# Patient Record
Sex: Female | Born: 1970 | Race: White | Hispanic: No | Smoking: Current every day smoker
Health system: Southern US, Community
[De-identification: ages and names within clinical notes are randomized; demographics above are authoritative.]

## PROBLEM LIST (undated history)

## (undated) DIAGNOSIS — G2581 Restless legs syndrome: Secondary | ICD-10-CM

## (undated) DIAGNOSIS — I4891 Unspecified atrial fibrillation: Secondary | ICD-10-CM

## (undated) DIAGNOSIS — I10 Essential (primary) hypertension: Secondary | ICD-10-CM

## (undated) DIAGNOSIS — I480 Paroxysmal atrial fibrillation: Secondary | ICD-10-CM

## (undated) DIAGNOSIS — U071 COVID-19: Secondary | ICD-10-CM

## (undated) DIAGNOSIS — R319 Hematuria, unspecified: Secondary | ICD-10-CM

## (undated) DIAGNOSIS — E785 Hyperlipidemia, unspecified: Secondary | ICD-10-CM

## (undated) DIAGNOSIS — R102 Pelvic and perineal pain: Secondary | ICD-10-CM

## (undated) DIAGNOSIS — K219 Gastro-esophageal reflux disease without esophagitis: Secondary | ICD-10-CM

## (undated) DIAGNOSIS — J189 Pneumonia, unspecified organism: Secondary | ICD-10-CM

## (undated) DIAGNOSIS — M4802 Spinal stenosis, cervical region: Secondary | ICD-10-CM

## (undated) DIAGNOSIS — J019 Acute sinusitis, unspecified: Secondary | ICD-10-CM

## (undated) DIAGNOSIS — R002 Palpitations: Secondary | ICD-10-CM

## (undated) DIAGNOSIS — I471 Supraventricular tachycardia, unspecified: Secondary | ICD-10-CM

## (undated) DIAGNOSIS — Z800 Family history of malignant neoplasm of digestive organs: Secondary | ICD-10-CM

## (undated) DIAGNOSIS — I499 Cardiac arrhythmia, unspecified: Secondary | ICD-10-CM

## (undated) DIAGNOSIS — Z803 Family history of malignant neoplasm of breast: Secondary | ICD-10-CM

## (undated) DIAGNOSIS — Z87442 Personal history of urinary calculi: Secondary | ICD-10-CM

## (undated) DIAGNOSIS — E042 Nontoxic multinodular goiter: Secondary | ICD-10-CM

## (undated) DIAGNOSIS — Z8041 Family history of malignant neoplasm of ovary: Secondary | ICD-10-CM

## (undated) DIAGNOSIS — R809 Proteinuria, unspecified: Secondary | ICD-10-CM

## (undated) DIAGNOSIS — I878 Other specified disorders of veins: Secondary | ICD-10-CM

## (undated) DIAGNOSIS — J8410 Pulmonary fibrosis, unspecified: Secondary | ICD-10-CM

## (undated) DIAGNOSIS — Z8051 Family history of malignant neoplasm of kidney: Secondary | ICD-10-CM

## (undated) DIAGNOSIS — N939 Abnormal uterine and vaginal bleeding, unspecified: Secondary | ICD-10-CM

## (undated) DIAGNOSIS — Q245 Malformation of coronary vessels: Secondary | ICD-10-CM

## (undated) DIAGNOSIS — Z808 Family history of malignant neoplasm of other organs or systems: Secondary | ICD-10-CM

## (undated) DIAGNOSIS — F419 Anxiety disorder, unspecified: Secondary | ICD-10-CM

## (undated) DIAGNOSIS — T8859XA Other complications of anesthesia, initial encounter: Secondary | ICD-10-CM

## (undated) DIAGNOSIS — M5412 Radiculopathy, cervical region: Secondary | ICD-10-CM

## (undated) DIAGNOSIS — J45909 Unspecified asthma, uncomplicated: Secondary | ICD-10-CM

## (undated) HISTORY — DX: Essential (primary) hypertension: I10

## (undated) HISTORY — DX: Abnormal uterine and vaginal bleeding, unspecified: N93.9

## (undated) HISTORY — DX: Family history of malignant neoplasm of digestive organs: Z80.0

## (undated) HISTORY — DX: Malformation of coronary vessels: Q24.5

## (undated) HISTORY — DX: Family history of malignant neoplasm of ovary: Z80.41

## (undated) HISTORY — DX: Family history of malignant neoplasm of kidney: Z80.51

## (undated) HISTORY — DX: Family history of malignant neoplasm of breast: Z80.3

## (undated) HISTORY — PX: ESOPHAGOGASTRODUODENOSCOPY (EGD) WITH ESOPHAGEAL DILATION: SHX5812

## (undated) HISTORY — PX: ABDOMINAL HYSTERECTOMY: SHX81

## (undated) HISTORY — PX: COLONOSCOPY: SHX174

## (undated) HISTORY — PX: TUBAL LIGATION: SHX77

## (undated) HISTORY — DX: Pelvic and perineal pain: R10.2

## (undated) HISTORY — DX: Family history of malignant neoplasm of other organs or systems: Z80.8

## (undated) HISTORY — PX: CARDIAC CATHETERIZATION: SHX172

---

## 2012-01-27 ENCOUNTER — Emergency Department: Payer: Self-pay | Admitting: Internal Medicine

## 2012-01-27 LAB — URINALYSIS, COMPLETE
Nitrite: NEGATIVE
Ph: 5 (ref 4.5–8.0)
Protein: 100
Specific Gravity: 1.026 (ref 1.003–1.030)
Squamous Epithelial: 21

## 2012-01-27 LAB — LIPASE, BLOOD: Lipase: 208 U/L (ref 73–393)

## 2012-01-27 LAB — COMPREHENSIVE METABOLIC PANEL
Albumin: 4.2 g/dL (ref 3.4–5.0)
Anion Gap: 6 — ABNORMAL LOW (ref 7–16)
Bilirubin,Total: 0.4 mg/dL (ref 0.2–1.0)
Chloride: 106 mmol/L (ref 98–107)
Co2: 25 mmol/L (ref 21–32)
EGFR (African American): >60
EGFR (Non-African Amer.): >60
Glucose: 83 mg/dL (ref 65–99)
Potassium: 4.6 mmol/L (ref 3.5–5.1)
SGOT(AST): 27 U/L (ref 15–37)
SGPT (ALT): 20 U/L
Sodium: 137 mmol/L (ref 136–145)

## 2012-01-27 LAB — CBC
MCH: 30.8 pg (ref 26.0–34.0)
MCHC: 33.1 g/dL (ref 32.0–36.0)
MCV: 93 fL (ref 80–100)
RBC: 4.78 10*6/uL (ref 3.80–5.20)
WBC: 10.2 10*3/uL (ref 3.6–11.0)

## 2012-01-27 LAB — HCG, QUANTITATIVE, PREGNANCY: Beta Hcg, Quant.: <1 m[IU]/mL — ABNORMAL LOW

## 2012-01-27 LAB — PREGNANCY, URINE: Pregnancy Test, Urine: NEGATIVE m[IU]/mL

## 2012-05-02 ENCOUNTER — Emergency Department: Payer: Self-pay | Admitting: Emergency Medicine

## 2012-05-03 LAB — PREGNANCY, URINE: Pregnancy Test, Urine: NEGATIVE m[IU]/mL

## 2012-05-03 LAB — URINALYSIS, COMPLETE
Bacteria: NONE SEEN
Bilirubin,UR: NEGATIVE
Glucose,UR: NEGATIVE mg/dL (ref 0–75)
Ketone: NEGATIVE
Specific Gravity: 1.025 (ref 1.003–1.030)
Squamous Epithelial: 4
WBC UR: 61 /HPF (ref 0–5)

## 2014-01-03 ENCOUNTER — Ambulatory Visit: Payer: Self-pay | Admitting: Surgery

## 2014-01-18 ENCOUNTER — Observation Stay: Payer: Self-pay | Admitting: Internal Medicine

## 2014-01-18 DIAGNOSIS — Z9889 Other specified postprocedural states: Secondary | ICD-10-CM

## 2014-01-18 DIAGNOSIS — Q245 Malformation of coronary vessels: Secondary | ICD-10-CM

## 2014-01-18 HISTORY — DX: Malformation of coronary vessels: Q24.5

## 2014-01-18 HISTORY — PX: CARDIAC CATHETERIZATION: SHX172

## 2014-01-18 HISTORY — DX: Other specified postprocedural states: Z98.890

## 2014-01-18 HISTORY — PX: LEFT HEART CATH AND CORONARY ANGIOGRAPHY: CATH118249

## 2014-01-18 LAB — TROPONIN I
Troponin-I: <0.02 ng/mL
Troponin-I: <0.02 ng/mL

## 2014-01-18 LAB — COMPREHENSIVE METABOLIC PANEL
ALBUMIN: 3.5 g/dL (ref 3.4–5.0)
ALT: 19 U/L (ref 12–78)
ANION GAP: 10 (ref 7–16)
Alkaline Phosphatase: 48 U/L
BUN: 13 mg/dL (ref 7–18)
Bilirubin,Total: 0.2 mg/dL (ref 0.2–1.0)
Calcium, Total: 8.7 mg/dL (ref 8.5–10.1)
Chloride: 108 mmol/L — ABNORMAL HIGH (ref 98–107)
Co2: 21 mmol/L (ref 21–32)
Creatinine: 0.82 mg/dL (ref 0.60–1.30)
EGFR (African American): >60
EGFR (Non-African Amer.): >60
GLUCOSE: 140 mg/dL — AB (ref 65–99)
OSMOLALITY: 280 (ref 275–301)
Potassium: 4 mmol/L (ref 3.5–5.1)
SGOT(AST): 17 U/L (ref 15–37)
SODIUM: 139 mmol/L (ref 136–145)
TOTAL PROTEIN: 6.9 g/dL (ref 6.4–8.2)

## 2014-01-18 LAB — CBC
HCT: 40.3 % (ref 35.0–47.0)
HGB: 13.2 g/dL (ref 12.0–16.0)
MCH: 30 pg (ref 26.0–34.0)
MCHC: 32.7 g/dL (ref 32.0–36.0)
MCV: 92 fL (ref 80–100)
Platelet: 247 10*3/uL (ref 150–440)
RBC: 4.39 10*6/uL (ref 3.80–5.20)
RDW: 13.2 % (ref 11.5–14.5)
WBC: 9 10*3/uL (ref 3.6–11.0)

## 2014-01-18 LAB — CK TOTAL AND CKMB (NOT AT ARMC)
CK, TOTAL: 62 U/L
CK, Total: 53 U/L
CK, Total: 58 U/L
CK-MB: 0.6 ng/mL (ref 0.5–3.6)
CK-MB: 0.5 ng/mL (ref 0.5–3.6)
CK-MB: 0.7 ng/mL (ref 0.5–3.6)

## 2014-01-18 LAB — URINALYSIS, COMPLETE
BILIRUBIN, UR: NEGATIVE
Glucose,UR: NEGATIVE mg/dL (ref 0–75)
Ketone: NEGATIVE
LEUKOCYTE ESTERASE: NEGATIVE
Nitrite: NEGATIVE
PH: 7 (ref 4.5–8.0)
PROTEIN: NEGATIVE
RBC,UR: 88 /HPF (ref 0–5)
Specific Gravity: 1.003 (ref 1.003–1.030)
Squamous Epithelial: <1

## 2014-01-18 LAB — LIPASE, BLOOD: Lipase: 260 U/L (ref 73–393)

## 2014-01-19 LAB — CBC WITH DIFFERENTIAL/PLATELET
BASOS PCT: 0.6 %
Basophil #: 0.1 10*3/uL (ref 0.0–0.1)
Eosinophil #: 0.1 10*3/uL (ref 0.0–0.7)
Eosinophil %: 1.2 %
HCT: 32 % — ABNORMAL LOW (ref 35.0–47.0)
HGB: 10.7 g/dL — ABNORMAL LOW (ref 12.0–16.0)
Lymphocyte #: 3.1 10*3/uL (ref 1.0–3.6)
Lymphocyte %: 39.6 %
MCH: 31 pg (ref 26.0–34.0)
MCHC: 33.3 g/dL (ref 32.0–36.0)
MCV: 93 fL (ref 80–100)
MONO ABS: 0.6 x10 3/mm (ref 0.2–0.9)
Monocyte %: 7.2 %
Neutrophil #: 4 10*3/uL (ref 1.4–6.5)
Neutrophil %: 51.4 %
Platelet: 190 10*3/uL (ref 150–440)
RBC: 3.44 10*6/uL — AB (ref 3.80–5.20)
RDW: 13.6 % (ref 11.5–14.5)
WBC: 7.9 10*3/uL (ref 3.6–11.0)

## 2014-01-19 LAB — BASIC METABOLIC PANEL
ANION GAP: 9 (ref 7–16)
BUN: 10 mg/dL (ref 7–18)
CO2: 22 mmol/L (ref 21–32)
CREATININE: 0.77 mg/dL (ref 0.60–1.30)
Calcium, Total: 7.8 mg/dL — ABNORMAL LOW (ref 8.5–10.1)
Chloride: 111 mmol/L — ABNORMAL HIGH (ref 98–107)
EGFR (African American): >60
Glucose: 144 mg/dL — ABNORMAL HIGH (ref 65–99)
Osmolality: 285 (ref 275–301)
Potassium: 4 mmol/L (ref 3.5–5.1)
SODIUM: 142 mmol/L (ref 136–145)

## 2014-01-19 LAB — LIPID PANEL
CHOLESTEROL: 131 mg/dL (ref 0–200)
HDL Cholesterol: 33 mg/dL — ABNORMAL LOW (ref 40–60)
Ldl Cholesterol, Calc: 82 mg/dL (ref 0–100)
Triglycerides: 82 mg/dL (ref 0–200)
VLDL CHOLESTEROL, CALC: 16 mg/dL (ref 5–40)

## 2014-01-20 LAB — URINE CULTURE

## 2014-05-15 DIAGNOSIS — Q245 Malformation of coronary vessels: Secondary | ICD-10-CM

## 2014-05-15 DIAGNOSIS — N939 Abnormal uterine and vaginal bleeding, unspecified: Secondary | ICD-10-CM

## 2014-05-15 HISTORY — DX: Malformation of coronary vessels: Q24.5

## 2014-05-15 HISTORY — DX: Abnormal uterine and vaginal bleeding, unspecified: N93.9

## 2014-06-30 ENCOUNTER — Emergency Department: Payer: Self-pay | Admitting: Emergency Medicine

## 2014-06-30 LAB — BASIC METABOLIC PANEL
Anion Gap: 7 (ref 7–16)
BUN: 13 mg/dL (ref 7–18)
CALCIUM: 8.1 mg/dL — AB (ref 8.5–10.1)
CHLORIDE: 110 mmol/L — AB (ref 98–107)
CREATININE: 0.94 mg/dL (ref 0.60–1.30)
Co2: 23 mmol/L (ref 21–32)
EGFR (African American): >60
EGFR (Non-African Amer.): >60
Glucose: 161 mg/dL — ABNORMAL HIGH (ref 65–99)
Osmolality: 283 (ref 275–301)
POTASSIUM: 4.1 mmol/L (ref 3.5–5.1)
Sodium: 140 mmol/L (ref 136–145)

## 2014-06-30 LAB — CBC
HCT: 42.5 % (ref 35.0–47.0)
HGB: 14.3 g/dL (ref 12.0–16.0)
MCH: 31.2 pg (ref 26.0–34.0)
MCHC: 33.5 g/dL (ref 32.0–36.0)
MCV: 93 fL (ref 80–100)
PLATELETS: 249 10*3/uL (ref 150–440)
RBC: 4.57 10*6/uL (ref 3.80–5.20)
RDW: 13 % (ref 11.5–14.5)
WBC: 7 10*3/uL (ref 3.6–11.0)

## 2014-06-30 LAB — URINALYSIS, COMPLETE
BILIRUBIN, UR: NEGATIVE
Blood: NEGATIVE
GLUCOSE, UR: NEGATIVE mg/dL (ref 0–75)
Ketone: NEGATIVE
LEUKOCYTE ESTERASE: NEGATIVE
NITRITE: NEGATIVE
Ph: 5 (ref 4.5–8.0)
Protein: NEGATIVE
RBC,UR: 1 /HPF (ref 0–5)
Specific Gravity: 1.019 (ref 1.003–1.030)
Squamous Epithelial: 2

## 2014-06-30 LAB — WET PREP, GENITAL

## 2014-06-30 LAB — PREGNANCY, URINE: Pregnancy Test, Urine: NEGATIVE m[IU]/mL

## 2014-06-30 LAB — GC/CHLAMYDIA PROBE AMP

## 2014-07-02 ENCOUNTER — Emergency Department: Payer: Self-pay | Admitting: Student

## 2014-07-02 LAB — URINALYSIS, COMPLETE
BILIRUBIN, UR: NEGATIVE
Glucose,UR: NEGATIVE mg/dL (ref 0–75)
KETONE: NEGATIVE
LEUKOCYTE ESTERASE: NEGATIVE
NITRITE: POSITIVE
Ph: 5 (ref 4.5–8.0)
Protein: NEGATIVE
RBC,UR: 1 /HPF (ref 0–5)
SPECIFIC GRAVITY: 1.025 (ref 1.003–1.030)
WBC UR: 2 /HPF (ref 0–5)

## 2014-07-02 LAB — COMPREHENSIVE METABOLIC PANEL
ALBUMIN: 3.9 g/dL (ref 3.4–5.0)
Alkaline Phosphatase: 59 U/L
Anion Gap: 6 — ABNORMAL LOW (ref 7–16)
BUN: 16 mg/dL (ref 7–18)
Bilirubin,Total: 0.2 mg/dL (ref 0.2–1.0)
CALCIUM: 8.6 mg/dL (ref 8.5–10.1)
CHLORIDE: 106 mmol/L (ref 98–107)
Co2: 26 mmol/L (ref 21–32)
Creatinine: 0.89 mg/dL (ref 0.60–1.30)
Glucose: 83 mg/dL (ref 65–99)
Osmolality: 276 (ref 275–301)
Potassium: 4 mmol/L (ref 3.5–5.1)
SGOT(AST): 18 U/L (ref 15–37)
SGPT (ALT): 29 U/L
Sodium: 138 mmol/L (ref 136–145)
Total Protein: 7.8 g/dL (ref 6.4–8.2)

## 2014-07-02 LAB — PREGNANCY, URINE: PREGNANCY TEST, URINE: NEGATIVE m[IU]/mL

## 2014-07-02 LAB — CBC
HCT: 45.1 % (ref 35.0–47.0)
HGB: 15.1 g/dL (ref 12.0–16.0)
MCH: 31.2 pg (ref 26.0–34.0)
MCHC: 33.6 g/dL (ref 32.0–36.0)
MCV: 93 fL (ref 80–100)
Platelet: 273 10*3/uL (ref 150–440)
RBC: 4.85 10*6/uL (ref 3.80–5.20)
RDW: 13.2 % (ref 11.5–14.5)
WBC: 7.5 10*3/uL (ref 3.6–11.0)

## 2014-07-02 LAB — LIPASE, BLOOD: Lipase: 156 U/L (ref 73–393)

## 2014-07-04 LAB — URINE CULTURE

## 2014-11-30 NOTE — Discharge Summary (Signed)
PATIENT NAME:  Nicole Horton, Nicole Horton MR#:  [id number] DATE OF BIRTH:  03-06-1971  DATE OF ADMISSION:  01/18/2014 DATE OF DISCHARGE:  01/19/2014  PRESENTING COMPLAINT: Chest pain.   DISCHARGE DIAGNOSES: 1.  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  2.  Tobacco abuse.  3.  Urinary tract infection.   Cardiac cath showed normal coronaries with suspected distal LAD vasospasm.   CODE STATUS: FULL CODE.   MEDICATIONS: 1.  Ibuprofen 200 mg 1 to 2 tablets as needed for pain.  2.  Imdur 30 mg p.o. daily.  3.  Lovastatin 10 mg daily.  4.  Aspirin 81 mg daily.  5.  Ciprofloxacin 500 mg b.i.d.   Follow up with Dr. Neoma Laming in 1 to 2 weeks.   Lipid profile within normal limits. CBC within normal limits, except H and H of 10.7 and 32.0. Platelet count is 190. Basic metabolic panel within normal limits.    Cardiac cath showed global EF was normal, 60%. Normal coronaries with normal LVEF. The patient has distal LAD myocardial bridge, which even though has normal coronaries, causes partial spasm in the artery, should be treated with aspirin, nitrites and close followup.   BRIEF SUMMARY OF HOSPITAL COURSE:  [name] is a 44 year old Caucasian female with no significant past medical history other than tobacco abuse, comes in with:  1.  Chest pain, but acute EKG changes while getting stress test done. The patient was seen by Dr. Humphrey Rolls and recommended cath; results as above were noted. Cardiac enzymes remained negative. She was continued on aspirin, Imdur and statins. Unable to give beta blockers due to low blood pressure. Her cath results as above were noted. She will follow up with Dr. Humphrey Rolls closely.  2.  Tobacco abuse. Counseled smoking cessation.  3.  Urinary tract infection. The patient advised to finish her Cipro.   TIME SPENT: 40 minutes.    ____________________________ Hart Rochester Posey Pronto, MD sap:dmm D: 01/21/2014  13:51:59 ET T: 01/21/2014 19:13:14 ET JOB#: [id number]  cc: Torrey Horseman A. Posey Pronto, MD, <Dictator> Dionisio David, MD Ilda Basset MD ELECTRONICALLY SIGNED 02/09/2014 10:21

## 2014-11-30 NOTE — Consult Note (Signed)
PATIENT NAME:  Nicole Horton, STALLINGS MR#:  [id number] DATE OF BIRTH:  1971-04-03  DATE OF CONSULTATION:  01/18/2014  REFERRING PHYSICIAN:   CONSULTING PHYSICIAN:  Dionisio David, MD  INDICATION FOR CONSULTATION: Chest pain.   HISTORY OF PRESENT ILLNESS: This is a 44 year old pleasant white female with a past medical history of hyperlipidemia, 1 pack per day smoking, presented to the Emergency Room with chest pain. 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 She woke up all of a sudden with chest pain. She has a prior history of having acid reflux. She takes Nexium, but she felt this pain was different in character from her normal acid reflux chest pain. She had some EKG changes and was scheduled for stress test after MI was ruled out. While doing stress test, initial EKG had no acute changes, normal sinus rhythm. During Lexiscan infusion, the patient developed 3 mm downsloping ST depression in the inferior leads and anterolateral leads associated with chest pain, diaphoresis and feeling of fainting. Thus it was decided to see the patient and continue to finish the scan because the chest pain resolved after watching her for like 10 minutes. We thus feel that she needs to have cardiac catheterization.   PAST MEDICAL HISTORY: History of hyperlipidemia. No history of hypertension. Actually has history of hypotension. No history of diabetes.   FAMILY HISTORY: Positive for cardiomyopathy. History of coronary artery disease. Mother had bypass, angioplasty.   PHYSICAL EXAMINATION: GENERAL: She is alert and oriented x3, in no acute distress right now.  VITAL SIGNS: Blood pressure 85/60, respirations 18, pulse 70. She is afebrile.  NECK: No JVD.  LUNGS: Clear.  HEART: Regular rate and rhythm. Normal S1, S2. No audible murmur.  ABDOMEN: Soft and nontender. Positive bowel sounds.  EXTREMITIES: No pedal edema.   DIAGNOSTIC DATA: Cardiac enzymes were negative.   Initial EKG showed normal sinus rhythm, 99 beats per minute. ST  depression of 1 mm to 2 mm in leads II, III, aVF and V3 to V6.   EKG prior to the stress test was unremarkable. Follow-up after the Lexiscan showed downsloping ST depressions with T wave inversions inferiorly and anterolaterally.   ASSESSMENT AND PLAN: Severe ischemia diffusely on EKG portion of the Lexiscan associated with chest pain, syncope. Continue to do scan on the patient, but because it is a Friday and severe symptoms and severe ischemia I advised cardiac catheterization, which will be scheduled for today. Discussed with Dr. Fritzi Mandes and the patient, and the patient has agreed to the procedure.   Thank you very much for the referral.  ____________________________ Dionisio David, MD sak:sb D: 01/18/2014 11:23:06 ET T: 01/18/2014 11:49:01 ET JOB#: [id number]  cc: Dionisio David, MD, <Dictator> Dionisio David MD ELECTRONICALLY SIGNED 02/20/2014 9:20

## 2014-11-30 NOTE — H&P (Signed)
PATIENT NAME:  Nicole Horton, Nicole Horton MR#:  [id number] DATE OF BIRTH:  1971-08-06  DATE OF ADMISSION:  01/18/2014  PRIMARY CARE PHYSICIAN: Enid Derry, MD  REFERRING PHYSICIAN: Dr. Jasmine December  CHIEF COMPLAINT: Chest pain.  HISTORY OF PRESENT ILLNESS: The patient is a 44 year old female  is presenting to the ER with the chief complaint of chest pain. The is reporting that she woke up with chest pain this morning following which she had nausea, vomiting, and diarrhea. The patient denies any shortness of breath, but she felt dizzy, had some tremors and chills. She denies any blood in her vomit or diarrhea. Some epigastric discomfort is noticed. She was diagnosed with UTI yesterday by her primary care physician and was given some antibiotics which were not filled by the patient yet. Initial troponin is negative, but EKG is abnormal with some nonspecific ST wave depressions. Hospitalist team is called to admit the patient. During my examination, the patient is denying any chest pain as she was given Dilaudid. After giving Dilaudid, the patient became hypotensive. Denies any history of heart attack in the past.   PAST MEDICAL HISTORY: Ectopic pregnancies.  PAST SURGICAL HISTORY: Tubal ligation.  ALLERGIES: CODEINE.   HOME MEDICATIONS: Ibuprofen 200 mg 3 times a day as needed.   FAMILY HISTORY: Mom has dilated cardiomyopathy.  REVIEW OF SYSTEMS: CONSTITUTIONAL: Denies any fever, fatigue. EYES: Denies blurry vision, double vision.  ENT: Denies epistaxis, discharge. RESPIRATORY: Denies cough, wheezing, COPD. CARDIOVASCULAR: Complaining of chest pain. Denies any palpitations, but has dizziness. No syncope.  GASTROINTESTINAL: Has nausea, vomiting, diarrhea and epigastric abdominal pain. No hematemesis or melena.  GENITOURINARY: No dysuria or hematuria.  GYNECOLOGIC AND BREASTS: Denies breast masses or vaginal discharge. ENDOCRINE: Denies polyuria, nocturia, thyroid problems, heat or cold  intolerance. HEMATOLOGIC AND LYMPHATIC: No anemia, easy bruising, bleeding.  INTEGUMENTARY: No acne, rash, lesions.  MUSCULOSKELETAL: No joint pain in the neck and back. Denies gout.  NEUROLOGIC: Denies vertigo, ataxia.  PSYCHIATRIC: No ADD, OCD.   PHYSICAL EXAMINATION: VITAL SIGNS: Temperature 98.2, pulse 66, respirations 18, blood pressure 93/45, pulse ox 98%.  GENERAL APPEARANCE: Not in acute distress. Moderately built and nourished. HEENT: Normocephalic, atraumatic. Pupils are equally reacting to light and accommodation. No scleral icterus. No conjunctival injection. No sinus tenderness. No postnasal drip.  NECK: Supple. No JVD. No thyromegaly. Range of motion is intact.  LUNGS: Clear to auscultation bilaterally. No accessory muscle usage. No anterior chest wall tenderness on palpation.  CARDIAC: S1 and S2. Regular rate and rhythm. No murmurs.  ABDOMEN: Soft. Bowel sounds are positive in all 4 quadrants. Nontender, nondistended. No hepatosplenomegaly. No masses felt.  NEUROLOGIC: Awake, alert and oriented x3. Cranial nerves II through XII are intact. Motor and sensory are intact. Reflexes are 2+.  EXTREMITIES: No edema. No cyanosis. No clubbing.  SKIN: Warm to touch. Normal turgor. No rashes. No lesions. No CVA tenderness.  PSYCHIATRIC: Normal mood and affect.   DIAGNOSTIC DATA: LFTs are normal. Two sets of cardiac enzymes are normal. CBC is normal. Urinalysis: Straw-colored, clear in appearance, blood 3+, nitrite negative, leukocyte esterase negative. Urine culture is ordered, which is pending. BMP: Glucose 140, chloride 108. The rest of the BMP is normal.   Chest x-ray, PA and lateral views: No active cardiopulmonary disease.   A 12-lead EKG has revealed normal sinus rhythm at 99 beats per minute, anterior/inferior lead nonspecific ST wave depressions and nonspecific inverted T waves.   ASSESSMENT AND PLAN: A 44 year old Caucasian female presenting to the ER with  the chief complaint  of chest pain followed by nausea, vomiting, diarrhea, epigastric discomfort and just diagnosed with urinary tract infection by primary care physician yesterday, will be admitted with the following assessment and plan: 1.  Chest pain with abnormal EKG. Rule out acute myocardial infarction. We will admit her to telemetry, cycle cardiac biomarkers. Cardiology consult is placed. Will obtain 2-D echocardiogram. Myoview test is ordered. The patient will be on oxygen, nitroglycerin, aspirin, and statin. We will hold off on beta blocker in view of hypotension and also as she is scheduled for Myoview test this morning.  2.  Acute gastroenteritis, probably viral in etiology. Will check stool test. Stool culture and sensitivity will be ordered.  3.  Urinary tract infection. Urinalysis is normal. Urine culture is pending. The patient will be provided with IV Rocephin empirically.  4.  We will provide her gastrointestinal and deep vein thrombosis prophylaxis.   She is FULL code. Daughter is the medical power of attorney. Diagnosis and plan of care was discussed in detail with the patient. She is aware of the plan.   TOTAL TIME SPENT ON ADMISSION: 50 minutes.  ____________________________ [name], MD ag:sb D: 01/18/2014 08:02:57 ET T: 01/18/2014 08:40:59 ET JOB#: [id number]  cc: [name], MD, <Dictator> Dionisio David, MD [name] MD ELECTRONICALLY SIGNED 01/27/2014 0:52

## 2015-03-26 ENCOUNTER — Encounter: Payer: Self-pay | Admitting: Obstetrics and Gynecology

## 2015-05-06 ENCOUNTER — Emergency Department
Admission: EM | Admit: 2015-05-06 | Discharge: 2015-05-06 | Disposition: A | Discharge status: Home / Self Care | Payer: BLUE CROSS/BLUE SHIELD | Attending: Emergency Medicine | Admitting: Emergency Medicine

## 2015-05-06 ENCOUNTER — Encounter: Payer: Self-pay | Admitting: Emergency Medicine

## 2015-05-06 ENCOUNTER — Emergency Department: Payer: BLUE CROSS/BLUE SHIELD

## 2015-05-06 DIAGNOSIS — Y9301 Activity, walking, marching and hiking: Secondary | ICD-10-CM | POA: Diagnosis not present

## 2015-05-06 DIAGNOSIS — S92252A Displaced fracture of navicular [scaphoid] of left foot, initial encounter for closed fracture: Secondary | ICD-10-CM | POA: Insufficient documentation

## 2015-05-06 DIAGNOSIS — I10 Essential (primary) hypertension: Secondary | ICD-10-CM | POA: Insufficient documentation

## 2015-05-06 DIAGNOSIS — Y998 Other external cause status: Secondary | ICD-10-CM | POA: Diagnosis not present

## 2015-05-06 DIAGNOSIS — S99922A Unspecified injury of left foot, initial encounter: Secondary | ICD-10-CM | POA: Diagnosis present

## 2015-05-06 DIAGNOSIS — Y9289 Other specified places as the place of occurrence of the external cause: Secondary | ICD-10-CM | POA: Diagnosis not present

## 2015-05-06 DIAGNOSIS — Z720 Tobacco use: Secondary | ICD-10-CM | POA: Diagnosis not present

## 2015-05-06 DIAGNOSIS — E785 Hyperlipidemia, unspecified: Secondary | ICD-10-CM | POA: Insufficient documentation

## 2015-05-06 DIAGNOSIS — W1839XA Other fall on same level, initial encounter: Secondary | ICD-10-CM | POA: Diagnosis not present

## 2015-05-06 HISTORY — DX: Gastro-esophageal reflux disease without esophagitis: K21.9

## 2015-05-06 HISTORY — DX: Hyperlipidemia, unspecified: E78.5

## 2015-05-06 MED ORDER — ONDANSETRON 8 MG PO TBDP: 8.0000 mg | ORAL_TABLET | Freq: Three times a day (TID) | ORAL | Status: DC | PRN

## 2015-05-06 MED ORDER — TRAMADOL HCL 50 MG PO TABS: 50.0000 mg | ORAL_TABLET | Freq: Four times a day (QID) | ORAL | Status: DC | PRN

## 2015-05-06 MED ORDER — TRAMADOL HCL 50 MG PO TABS
50.0000 mg | ORAL_TABLET | Freq: Once | ORAL | Status: AC
  Administered 2015-05-06: 50 mg via ORAL
  Filled 2015-05-06: qty 1

## 2015-05-06 NOTE — ED Provider Notes (Signed)
[facility] Emergency Department Provider Note  ____________________________________________  Time seen: Approximately 10:06 AM  I have reviewed the triage vital signs and the nursing notes.   HISTORY  Chief Complaint Foot Pain    HPI Nicole Horton is a 44 y.o. female patient complaining of left foot pain secondary to a fall. Patient states she is walking to her driveway which is full of rocks and twisted and fell. Patient states since yesterday she's having trouble ambulating secondary to pain to the left foot. Patient currently rates her pain as a 9/10 describe the sharp. No palliative measures since onset.   Past Medical History  Diagnosis Date  . Hyperlipidemia   . Hypertension   . GERD (gastroesophageal reflux disease)     There are no active problems to display for this patient.   Past Surgical History  Procedure Laterality Date  . Tubal ligation      Current Outpatient Rx  Name  Route  Sig  Dispense  Refill  . ondansetron (ZOFRAN ODT) 8 MG disintegrating tablet   Oral   Take 1 tablet (8 mg total) by mouth every 8 (eight) hours as needed for nausea or vomiting.   20 tablet   0   . traMADol (ULTRAM) 50 MG tablet   Oral   Take 1 tablet (50 mg total) by mouth every 6 (six) hours as needed.   20 tablet   0     Allergies Codeine  History reviewed. No pertinent family history.  Social History Social History  Substance Use Topics  . Smoking status: Current Every Day Smoker  . Smokeless tobacco: None  . Alcohol Use: No    Review of Systems Constitutional: No fever/chills Eyes: No visual changes. ENT: No sore throat. Cardiovascular: Denies chest pain. Respiratory: Denies shortness of breath. Gastrointestinal: No abdominal pain.  No nausea, no vomiting.  No diarrhea.  No constipation. Genitourinary: Negative for dysuria. Musculoskeletal: Negative for back pain. Skin: Negative for rash. Neurological: Negative for headaches,  focal weakness or numbness. Endocrine:Hyperlipidemia and hypertension. Allergic/Immunilogical: Codeine  10-point ROS otherwise negative.  ____________________________________________   PHYSICAL EXAM:  VITAL SIGNS: ED Triage Vitals  Enc Vitals Group     BP 05/06/15 0931 125/79 mmHg     Pulse Rate 05/06/15 0931 89     Resp 05/06/15 0931 20     Temp 05/06/15 0931 98.4 F (36.9 C)     Temp Source 05/06/15 0931 Oral     SpO2 05/06/15 0931 98 %     Weight 05/06/15 0931 160 lb (72.576 kg)     Height 05/06/15 0931 5\' 1"  (1.549 m)     Head Cir --      Peak Flow --      Pain Score 05/06/15 0926 9     Pain Loc --      Pain Edu? --      Excl. in [place]? --     Constitutional: Alert and oriented. Well appearing and in no acute distress. Eyes: Conjunctivae are normal. PERRL. EOMI. Head: Atraumatic. Nose: No congestion/rhinnorhea. Mouth/Throat: Mucous membranes are moist.  Oropharynx non-erythematous. Neck: No stridor.   Hematological/Lymphatic/Immunilogical: No cervical lymphadenopathy. Cardiovascular: Normal rate, regular rhythm. Grossly normal heart sounds.  Good peripheral circulation. Respiratory: Normal respiratory effort.  No retractions. Lungs CTAB. Gastrointestinal: Soft and nontender. No distention. No abdominal bruits. No CVA tenderness. Musculoskeletal: Nauseous deformity of the left foot. Moderate edema. Guarding palpation dorsal aspect of the left foot. Patient patient bears weight complain  of pain with ambulation.  Neurologic:  Normal speech and language. No gross focal neurologic deficits are appreciated. No gait instability. Skin:  Skin is warm, dry and intact. No rash noted. Psychiatric: Mood and affect are normal. Speech and behavior are normal.  ____________________________________________   LABS (all labs ordered are listed, but only abnormal results are displayed)  Labs Reviewed - No data to  display ____________________________________________  EKG   ____________________________________________  RADIOLOGY  X-rays suggestive of probable mid foot fracture. I, Sable Feil, personally viewed and evaluated these images (plain radiographs) as part of my medical decision making.   ____________________________________________   PROCEDURES  Procedure(s) performed: None  Critical Care performed: No  ____________________________________________   INITIAL IMPRESSION / ASSESSMENT AND PLAN / ED COURSE  Pertinent labs & imaging results that were available during my care of the patient were reviewed by me and considered in my medical decision making (see chart for details).  Left midfoot fracture. Patient placed in a splint and given crutch ambulation. She is given prescription for tramadol and advised follow-up with orthopedics. Patient given a work note until evaluation by orthopedics. ____________________________________________   FINAL CLINICAL IMPRESSION(S) / ED DIAGNOSES  Final diagnoses:  Left navicular fracture of foot, closed, initial encounter      Sable Feil, PA-C 05/06/15 [place], MD 05/06/15 [id number]

## 2015-05-06 NOTE — Discharge Instructions (Signed)
Wear splint and ambulate  with crutches pending Ortho evaluation.

## 2015-05-06 NOTE — ED Notes (Signed)
Pt to ed with c/o left foot pain. Pt states she was walking this am to go to her car and fell down rock walkway at home.  Pt reports she was unable to walk on it.

## 2015-07-17 ENCOUNTER — Ambulatory Visit
Admission: EM | Admit: 2015-07-17 | Discharge: 2015-07-17 | Disposition: A | Discharge status: Home / Self Care | Payer: BLUE CROSS/BLUE SHIELD | Attending: Family Medicine | Admitting: Family Medicine

## 2015-07-17 DIAGNOSIS — N3000 Acute cystitis without hematuria: Secondary | ICD-10-CM | POA: Diagnosis not present

## 2015-07-17 LAB — URINALYSIS COMPLETE WITH MICROSCOPIC (ARMC ONLY)
BILIRUBIN URINE: NEGATIVE
GLUCOSE, UA: NEGATIVE mg/dL
Hgb urine dipstick: NEGATIVE
Ketones, ur: NEGATIVE mg/dL
Nitrite: POSITIVE — AB
Protein, ur: NEGATIVE mg/dL
RBC / HPF: NONE SEEN RBC/hpf (ref 0–5)
Specific Gravity, Urine: 1.015 (ref 1.005–1.030)
pH: 6.5 (ref 5.0–8.0)

## 2015-07-17 MED ORDER — FLUCONAZOLE 200 MG PO TABS: 200.0000 mg | ORAL_TABLET | Freq: Once | ORAL | Status: AC

## 2015-07-17 MED ORDER — SULFAMETHOXAZOLE-TRIMETHOPRIM 800-160 MG PO TABS: 1.0000 | ORAL_TABLET | Freq: Two times a day (BID) | ORAL | Status: AC

## 2015-07-17 NOTE — Discharge Instructions (Signed)

## 2015-07-17 NOTE — ED Notes (Signed)
Hx UTI's. Started last Saturday with low right back pain. + frequency.

## 2015-07-17 NOTE — ED Provider Notes (Signed)
CSN: [id number]     Arrival date & time 07/17/15  1138 History   First MD Initiated Contact with Patient 07/17/15 1257     Chief Complaint  Patient presents with  . Urinary Tract Infection   (Consider location/radiation/quality/duration/timing/severity/associated sxs/prior Treatment) The history is provided by the patient.     Nicole Horton is a 44 y.o. female presenting for R lower back pain for 4-5 days.  She reports nocturia x2-3 times nightly, urinary urgency and frequency x1.5 wks.    She had an UTI about a year ago with similar symptoms and was treated with Bactrim.  She reports that she always gets a yeast infection that needs to be treated with Diflucan after taking antibiotics.  Patient reports she was told she is going through menopause recently by OB/gyn.  Past Medical History  Diagnosis Date  . Hyperlipidemia   . Hypertension   . GERD (gastroesophageal reflux disease)    Past Surgical History  Procedure Laterality Date  . Tubal ligation     Family History  Problem Relation Age of Onset  . Diabetes Mother   . Heart failure Mother    Social History  Substance Use Topics  . Smoking status: Current Every Day Smoker -- 0.50 packs/day    Types: Cigarettes  . Smokeless tobacco: None  . Alcohol Use: No   OB History    Gravida Para Term Preterm AB TAB SAB Ectopic Multiple Living   4         4     Review of Systems  Constitutional: Positive for chills. Negative for activity change, appetite change and fatigue.  HENT: Negative.   Respiratory: Negative.   Cardiovascular: Negative.   Gastrointestinal: Positive for abdominal pain. Negative for nausea, vomiting, diarrhea and constipation.  Genitourinary: Positive for urgency, frequency and flank pain. Negative for dysuria, hematuria and difficulty urinating.  Musculoskeletal: Negative.   Skin: Negative.   Neurological: Negative.     Allergies  Codeine  Home Medications   Prior to Admission medications    Medication Sig Start Date End Date Taking? Authorizing Provider  amLODipine (NORVASC) 5 MG tablet Take 5 mg by mouth daily.   Yes Historical Provider, MD  esomeprazole (NEXIUM) 20 MG capsule Take 20 mg by mouth daily at 12 noon.   Yes Historical Provider, MD  fluconazole (DIFLUCAN) 200 MG tablet Take 1 tablet (200 mg total) by mouth once. As needed for yeast infection 07/17/15 07/24/15  Virginia Crews, MD  ondansetron (ZOFRAN ODT) 8 MG disintegrating tablet Take 1 tablet (8 mg total) by mouth every 8 (eight) hours as needed for nausea or vomiting. 05/06/15   Sable Feil, PA-C  sulfamethoxazole-trimethoprim (BACTRIM DS,SEPTRA DS) 800-160 MG tablet Take 1 tablet by mouth 2 (two) times daily. 07/17/15 07/24/15  Virginia Crews, MD  traMADol (ULTRAM) 50 MG tablet Take 1 tablet (50 mg total) by mouth every 6 (six) hours as needed. 05/06/15 05/05/16  Sable Feil, PA-C   Meds Ordered and Administered this Visit  Medications - No data to display  BP 108/89 mmHg  Pulse 84  Temp(Src) 97.4 F (36.3 C) (Tympanic)  Resp 16  Ht 5' 1.5" (1.562 m)  Wt 165 lb (74.844 kg)  BMI 30.68 kg/m2  SpO2 100%  LMP 04/18/2015 (Exact Date) No data found.   Physical Exam  Constitutional: She is oriented to person, place, and time. She appears well-developed and well-nourished. No distress.  HENT:  Head: Normocephalic and atraumatic.  Eyes: Conjunctivae  are normal. No scleral icterus.  Neck: Neck supple.  Cardiovascular: Normal rate, regular rhythm and normal heart sounds.   No murmur heard. Pulmonary/Chest: Effort normal and breath sounds normal. No respiratory distress. She has no wheezes.  Abdominal: Soft. Bowel sounds are normal. She exhibits no distension.  Suprapubic tenderness R CVA tenderness  Musculoskeletal: She exhibits no edema or tenderness.  Neurological: She is alert and oriented to person, place, and time.  Skin: Skin is warm and dry. No rash noted.  Psychiatric: She has a normal  mood and affect. Her behavior is normal.    ED Course  Procedures (including critical care time)  Labs Review Labs Reviewed  URINALYSIS COMPLETEWITH MICROSCOPIC ([place]) - Abnormal; Notable for the following:    Nitrite POSITIVE (*)    Leukocytes, UA TRACE (*)    Bacteria, UA MANY (*)    Squamous Epithelial / LPF 6-30 (*)    All other components within normal limits  URINE CULTURE    Imaging Review No results found.   Visual Acuity Review  Right Eye Distance:   Left Eye Distance:   Bilateral Distance:    Right Eye Near:   Left Eye Near:    Bilateral Near:       MDM   1. Acute cystitis without hematuria     Nicole Horton is a 44 y.o. female with symptoms, exam, and UA consistent with UTI.  With R CVA tenderness, suspicion for ascension of infection to upper urinary tract.  Previous UTI was ESBL E Coli sensitive to Bactrim.  Will treat as complicated UTI with 7 day course of Bactrim.  Send urine for culture.  Return precautions given.  Also gave Rx for one dose of diflucan to use after course of antibiotics in case of possible antibiotic-driven vaginal yeast infection.   Virginia Crews, MD, MPH PGY-2,  [facility] 07/17/2015 1:34 PM   Virginia Crews, MD 07/17/15 1336

## 2015-07-19 LAB — URINE CULTURE: Special Requests: NORMAL

## 2015-07-31 DIAGNOSIS — I10 Essential (primary) hypertension: Secondary | ICD-10-CM | POA: Insufficient documentation

## 2015-07-31 HISTORY — DX: Essential (primary) hypertension: I10

## 2015-08-06 ENCOUNTER — Other Ambulatory Visit: Payer: Self-pay | Admitting: Family Medicine

## 2015-08-06 DIAGNOSIS — R109 Unspecified abdominal pain: Secondary | ICD-10-CM

## 2015-08-07 ENCOUNTER — Emergency Department
Admission: EM | Admit: 2015-08-07 | Discharge: 2015-08-07 | Disposition: A | Discharge status: Home / Self Care | Payer: BLUE CROSS/BLUE SHIELD | Attending: Emergency Medicine | Admitting: Emergency Medicine

## 2015-08-07 ENCOUNTER — Encounter: Payer: Self-pay | Admitting: Emergency Medicine

## 2015-08-07 ENCOUNTER — Emergency Department: Payer: BLUE CROSS/BLUE SHIELD

## 2015-08-07 DIAGNOSIS — F17210 Nicotine dependence, cigarettes, uncomplicated: Secondary | ICD-10-CM | POA: Diagnosis not present

## 2015-08-07 DIAGNOSIS — Z792 Long term (current) use of antibiotics: Secondary | ICD-10-CM | POA: Diagnosis not present

## 2015-08-07 DIAGNOSIS — Z79899 Other long term (current) drug therapy: Secondary | ICD-10-CM | POA: Diagnosis not present

## 2015-08-07 DIAGNOSIS — Z791 Long term (current) use of non-steroidal anti-inflammatories (NSAID): Secondary | ICD-10-CM | POA: Insufficient documentation

## 2015-08-07 DIAGNOSIS — R109 Unspecified abdominal pain: Secondary | ICD-10-CM | POA: Diagnosis present

## 2015-08-07 DIAGNOSIS — N200 Calculus of kidney: Secondary | ICD-10-CM

## 2015-08-07 LAB — CBC WITH DIFFERENTIAL/PLATELET
BASOS ABS: 0.1 10*3/uL (ref 0–0.1)
BASOS PCT: 1 %
EOS ABS: 0.1 10*3/uL (ref 0–0.7)
EOS PCT: 2 %
HCT: 42.5 % (ref 35.0–47.0)
Hemoglobin: 14.1 g/dL (ref 12.0–16.0)
LYMPHS PCT: 33 %
Lymphs Abs: 2.3 10*3/uL (ref 1.0–3.6)
MCH: 29.4 pg (ref 26.0–34.0)
MCHC: 33.1 g/dL (ref 32.0–36.0)
MCV: 89 fL (ref 80.0–100.0)
Monocytes Absolute: 0.5 10*3/uL (ref 0.2–0.9)
Monocytes Relative: 7 %
Neutro Abs: 3.9 10*3/uL (ref 1.4–6.5)
Neutrophils Relative %: 57 %
PLATELETS: 298 10*3/uL (ref 150–440)
RBC: 4.77 MIL/uL (ref 3.80–5.20)
RDW: 13.9 % (ref 11.5–14.5)
WBC: 6.9 10*3/uL (ref 3.6–11.0)

## 2015-08-07 LAB — URINALYSIS COMPLETE WITH MICROSCOPIC (ARMC ONLY)
BACTERIA UA: NONE SEEN
BILIRUBIN URINE: NEGATIVE
GLUCOSE, UA: NEGATIVE mg/dL
Hgb urine dipstick: NEGATIVE
Ketones, ur: NEGATIVE mg/dL
LEUKOCYTES UA: NEGATIVE
Nitrite: NEGATIVE
PH: 5 (ref 5.0–8.0)
PROTEIN: NEGATIVE mg/dL
RBC / HPF: NONE SEEN RBC/hpf (ref 0–5)
SPECIFIC GRAVITY, URINE: 1.014 (ref 1.005–1.030)
WBC, UA: NONE SEEN WBC/hpf (ref 0–5)

## 2015-08-07 LAB — COMPREHENSIVE METABOLIC PANEL
ALT: 23 U/L (ref 14–54)
AST: 19 U/L (ref 15–41)
Albumin: 4 g/dL (ref 3.5–5.0)
Alkaline Phosphatase: 48 U/L (ref 38–126)
Anion gap: 7 (ref 5–15)
BILIRUBIN TOTAL: 0.5 mg/dL (ref 0.3–1.2)
BUN: 9 mg/dL (ref 6–20)
CALCIUM: 8.9 mg/dL (ref 8.9–10.3)
CHLORIDE: 109 mmol/L (ref 101–111)
CO2: 20 mmol/L — ABNORMAL LOW (ref 22–32)
CREATININE: 0.66 mg/dL (ref 0.44–1.00)
Glucose, Bld: 138 mg/dL — ABNORMAL HIGH (ref 65–99)
Potassium: 4 mmol/L (ref 3.5–5.1)
Sodium: 136 mmol/L (ref 135–145)
TOTAL PROTEIN: 7.1 g/dL (ref 6.5–8.1)

## 2015-08-07 LAB — LIPASE, BLOOD: LIPASE: 32 U/L (ref 11–51)

## 2015-08-07 MED ORDER — NAPROXEN 500 MG PO TABS: 500.0000 mg | ORAL_TABLET | Freq: Two times a day (BID) | ORAL | Status: DC

## 2015-08-07 MED ORDER — IOHEXOL 240 MG/ML SOLN
25.0000 mL | Freq: Once | INTRAMUSCULAR | Status: AC | PRN
  Administered 2015-08-07: 25 mL via ORAL

## 2015-08-07 MED ORDER — PHENAZOPYRIDINE HCL 200 MG PO TABS: 200.0000 mg | ORAL_TABLET | Freq: Three times a day (TID) | ORAL | Status: DC | PRN

## 2015-08-07 MED ORDER — METHYLPREDNISOLONE SODIUM SUCC 125 MG IJ SOLR
125.0000 mg | Freq: Once | INTRAMUSCULAR | Status: AC
  Administered 2015-08-07: 125 mg via INTRAVENOUS
  Filled 2015-08-07: qty 2

## 2015-08-07 MED ORDER — IOHEXOL 350 MG/ML SOLN
100.0000 mL | Freq: Once | INTRAVENOUS | Status: AC | PRN
  Administered 2015-08-07: 100 mL via INTRAVENOUS

## 2015-08-07 MED ORDER — ONDANSETRON HCL 4 MG/2ML IJ SOLN
4.0000 mg | Freq: Once | INTRAMUSCULAR | Status: AC
  Administered 2015-08-07: 4 mg via INTRAVENOUS
  Filled 2015-08-07: qty 2

## 2015-08-07 MED ORDER — DIPHENHYDRAMINE HCL 50 MG/ML IJ SOLN
25.0000 mg | Freq: Once | INTRAMUSCULAR | Status: AC
  Administered 2015-08-07: 25 mg via INTRAVENOUS
  Filled 2015-08-07: qty 1

## 2015-08-07 MED ORDER — MORPHINE SULFATE (PF) 4 MG/ML IV SOLN
4.0000 mg | Freq: Once | INTRAVENOUS | Status: AC
  Administered 2015-08-07: 4 mg via INTRAVENOUS
  Filled 2015-08-07: qty 1

## 2015-08-07 MED ORDER — TAMSULOSIN HCL 0.4 MG PO CAPS: 0.4000 mg | ORAL_CAPSULE | Freq: Every day | ORAL | Status: DC

## 2015-08-07 MED ORDER — ONDANSETRON 8 MG PO TBDP: 8.0000 mg | ORAL_TABLET | Freq: Three times a day (TID) | ORAL | Status: DC | PRN

## 2015-08-07 MED ORDER — SODIUM CHLORIDE 0.9 % IV BOLUS (SEPSIS)
1000.0000 mL | Freq: Once | INTRAVENOUS | Status: AC
  Administered 2015-08-07: 1000 mL via INTRAVENOUS

## 2015-08-07 NOTE — ED Notes (Signed)
Been treated for UTI x1 month , x2 week right flank pain, yesterday seen at PCP (- for uti ) , this AM right side pain increased worse than ever

## 2015-08-07 NOTE — ED Notes (Signed)
Pt to ed with c/o right flank pain that started about 2 weeks ago,  Pt reports increased pain in right flank today and severe pain while urinating today.  Pt states pain increases with movement or voiding.  Pt alert and oriented and labs drawn and sent and IV started,  Pt advised need for urine.

## 2015-08-07 NOTE — Discharge Instructions (Signed)
Kidney Stones °Kidney stones (urolithiasis) are deposits that form inside your kidneys. The intense pain is caused by the stone moving through the urinary tract. When the stone moves, the ureter goes into spasm around the stone. The stone is usually passed in the urine.  °CAUSES  °· A disorder that makes certain neck glands produce too much parathyroid hormone (primary hyperparathyroidism). °· A buildup of uric acid crystals, similar to gout in your joints. °· Narrowing (stricture) of the ureter. °· A kidney obstruction present at birth (congenital obstruction). °· Previous surgery on the kidney or ureters. °· Numerous kidney infections. °SYMPTOMS  °· Feeling sick to your stomach (nauseous). °· Throwing up (vomiting). °· Blood in the urine (hematuria). °· Pain that usually spreads (radiates) to the groin. °· Frequency or urgency of urination. °DIAGNOSIS  °· Taking a history and physical exam. °· Blood or urine tests. °· CT scan. °· Occasionally, an examination of the inside of the urinary bladder (cystoscopy) is performed. °TREATMENT  °· Observation. °· Increasing your fluid intake. °· Extracorporeal shock wave lithotripsy--This is a noninvasive procedure that uses shock waves to break up kidney stones. °· Surgery may be needed if you have severe pain or persistent obstruction. There are various surgical procedures. Most of the procedures are performed with the use of small instruments. Only small incisions are needed to accommodate these instruments, so recovery time is minimized. °The size, location, and chemical composition are all important variables that will determine the proper choice of action for you. Talk to your health care provider to better understand your situation so that you will minimize the risk of injury to yourself and your kidney.  °HOME CARE INSTRUCTIONS  °· Drink enough water and fluids to keep your urine clear or pale yellow. This will help you to pass the stone or stone fragments. °· Strain  all urine through the provided strainer. Keep all particulate matter and stones for your health care provider to see. The stone causing the pain may be as small as a grain of salt. It is very important to use the strainer each and every time you pass your urine. The collection of your stone will allow your health care provider to analyze it and verify that a stone has actually passed. The stone analysis will often identify what you can do to reduce the incidence of recurrences. °· Only take over-the-counter or prescription medicines for pain, discomfort, or fever as directed by your health care provider. °· Keep all follow-up visits as told by your health care provider. This is important. °· Get follow-up X-rays if required. The absence of pain does not always mean that the stone has passed. It may have only stopped moving. If the urine remains completely obstructed, it can cause loss of kidney function or even complete destruction of the kidney. It is your responsibility to make sure X-rays and follow-ups are completed. Ultrasounds of the kidney can show blockages and the status of the kidney. Ultrasounds are not associated with any radiation and can be performed easily in a matter of minutes. °· Make changes to your daily diet as told by your health care provider. You may be told to: °¨ Limit the amount of salt that you eat. °¨ Eat 5 or more servings of fruits and vegetables each day. °¨ Limit the amount of meat, poultry, fish, and eggs that you eat. °· Collect a 24-hour urine sample as told by your health care provider. You may need to collect another urine sample every 6-12   months. SEEK MEDICAL CARE IF:  You experience pain that is progressive and unresponsive to any pain medicine you have been prescribed. SEEK IMMEDIATE MEDICAL CARE IF:   Pain cannot be controlled with the prescribed medicine.  You have a fever or shaking chills.  The severity or intensity of pain increases over 18 hours and is not  relieved by pain medicine.  You develop a new onset of abdominal pain.  You feel faint or pass out.  You are unable to urinate.   This information is not intended to replace advice given to you by your health care provider. Make sure you discuss any questions you have with your health care provider.   Document Released: 07/26/2005 Document Revised: 04/16/2015 Document Reviewed: 12/27/2012 Elsevier Interactive Patient Education 2016 [place].  Renal Colic Renal colic is pain that is caused by passing a kidney stone. The pain can be sharp and severe. It may be felt in the back, abdomen, side (flank), or groin. It can cause nausea. Renal colic can come and go. HOME CARE INSTRUCTIONS Watch your condition for any changes. The following actions may help to lessen any discomfort that you are feeling:  Take medicines only as directed by your health care provider.  Ask your health care provider if it is okay to take over-the-counter pain medicine.  Drink enough fluid to keep your urine clear or pale yellow. Drink 6-8 glasses of water each day.  Limit the amount of salt that you eat to less than 2 grams per day.  Reduce the amount of protein in your diet. Eat less meat, fish, nuts, and dairy.  Avoid foods such as spinach, rhubarb, nuts, or bran. These may make kidney stones more likely to form. SEEK MEDICAL CARE IF:  You have a fever or chills.  Your urine smells bad or looks cloudy.  You have pain or burning when you pass urine. SEEK IMMEDIATE MEDICAL CARE IF:  Your flank pain or groin pain suddenly worsens.  You become confused or disoriented or you lose consciousness.   This information is not intended to replace advice given to you by your health care provider. Make sure you discuss any questions you have with your health care provider.   Document Released: 05/05/2005 Document Revised: 08/16/2014 Document Reviewed: 06/05/2014 Elsevier Interactive Patient Education [employer]

## 2015-08-07 NOTE — ED Provider Notes (Signed)
[facility] Emergency Department Provider Note  ____________________________________________  Time seen: 10:35 AM  I have reviewed the triage vital signs and the nursing notes.   HISTORY  Chief Complaint Flank Pain    HPI Nicole Horton is a 44 y.o. female who complains of severe right flank pain that started this morning associated with nausea and vomiting. She's had these episodically over the past 2 weeks. She is followed up with primary care if you times and been treated for urinary tract infection several times in the past month. No hematuria, no fevers chills chest pain or shortness of breath.No dizziness or syncope. No abdominal pain.  Pain is colicky, nonradiating, severe, no aggravating or alleviating factors  Past Medical History  Diagnosis Date  . Hyperlipidemia   . GERD (gastroesophageal reflux disease)      There are no active problems to display for this patient.    Past Surgical History  Procedure Laterality Date  . Tubal ligation    . Cardiac surgery       Current Outpatient Rx  Name  Route  Sig  Dispense  Refill  . amLODipine (NORVASC) 5 MG tablet   Oral   Take 5 mg by mouth daily.         Marland Kitchen esomeprazole (NEXIUM) 20 MG capsule   Oral   Take 20 mg by mouth daily at 12 noon.         . lovastatin (MEVACOR) 10 MG tablet   Oral   Take 1 tablet by mouth daily.      2   . nitrofurantoin, macrocrystal-monohydrate, (MACROBID) 100 MG capsule   Oral   Take 1 capsule by mouth 2 (two) times daily.      0   . naproxen (NAPROSYN) 500 MG tablet   Oral   Take 1 tablet (500 mg total) by mouth 2 (two) times daily with a meal.   20 tablet   0   . ondansetron (ZOFRAN ODT) 8 MG disintegrating tablet   Oral   Take 1 tablet (8 mg total) by mouth every 8 (eight) hours as needed for nausea or vomiting.   20 tablet   0   . phenazopyridine (PYRIDIUM) 200 MG tablet   Oral   Take 1 tablet (200 mg total) by mouth 3 (three) times  daily as needed for pain.   10 tablet   0   . tamsulosin (FLOMAX) 0.4 MG CAPS capsule   Oral   Take 1 capsule (0.4 mg total) by mouth daily.   30 capsule   0   . traMADol (ULTRAM) 50 MG tablet   Oral   Take 1 tablet (50 mg total) by mouth every 6 (six) hours as needed. Patient not taking: Reported on 08/07/2015   20 tablet   0      Allergies Codeine   Family History  Problem Relation Age of Onset  . Diabetes Mother   . Heart failure Mother     Social History Social History  Substance Use Topics  . Smoking status: Current Every Day Smoker -- 0.50 packs/day    Types: Cigarettes  . Smokeless tobacco: None  . Alcohol Use: No    Review of Systems  Constitutional:   No fever or chills. No weight changes Eyes:   No blurry vision or double vision.  ENT:   No sore throat. Cardiovascular:   No chest pain. Respiratory:   No dyspnea or cough. Gastrointestinal:   Negative for abdominal pain,  vomiting and diarrhea.  No BRBPR or melena. Genitourinary:   Negative for dysuria, urinary retention, bloody urine, or difficulty urinating. Musculoskeletal:   Positive right flank pain. Skin:   Negative for rash. Neurological:   Negative for headaches, focal weakness or numbness. Psychiatric:  No anxiety or depression.   Endocrine:  No hot/cold intolerance, changes in energy, or sleep difficulty.  10-point ROS otherwise negative.  ____________________________________________   PHYSICAL EXAM:  VITAL SIGNS: ED Triage Vitals  Enc Vitals Group     BP 08/07/15 1018 114/65 mmHg     Pulse Rate 08/07/15 1018 83     Resp 08/07/15 1018 18     Temp 08/07/15 1018 98.5 F (36.9 C)     Temp Source 08/07/15 1018 Oral     SpO2 08/07/15 1018 96 %     Weight 08/07/15 1018 175 lb (79.379 kg)     Height 08/07/15 1018 5\' 1"  (1.549 m)     Head Cir --      Peak Flow --      Pain Score 08/07/15 1019 10     Pain Loc --      Pain Edu? --      Excl. in [place]? --     Vital signs reviewed,  nursing assessments reviewed.   Constitutional:   Alert and oriented. Moderate distress due to pain. Anxious Eyes:   No scleral icterus. No conjunctival pallor. PERRL. EOMI ENT   Head:   Normocephalic and atraumatic.   Nose:   No congestion/rhinnorhea. No septal hematoma   Mouth/Throat:   MMM, no pharyngeal erythema. No peritonsillar mass. No uvula shift.   Neck:   No stridor. No SubQ emphysema. No meningismus. Hematological/Lymphatic/Immunilogical:   No cervical lymphadenopathy. Cardiovascular:   RRR. Normal and symmetric distal pulses are present in all extremities. No murmurs, rubs, or gallops. Respiratory:   Normal respiratory effort without tachypnea nor retractions. Breath sounds are clear and equal bilaterally. No wheezes/rales/rhonchi. Gastrointestinal:   Soft and nontender. No distention. There is right side CVA tenderness.  No rebound, rigidity, or guarding. Genitourinary:   deferred Musculoskeletal:   Nontender with normal range of motion in all extremities. No joint effusions.  No lower extremity tenderness.  No edema. Neurologic:   Normal speech and language.  CN 2-10 normal. Motor grossly intact. No pronator drift.  Normal gait. No gross focal neurologic deficits are appreciated.  Skin:    Skin is warm, dry and intact. No rash noted.  No petechiae, purpura, or bullae. Psychiatric:   Mood and affect are normal. Speech and behavior are normal. Patient exhibits appropriate insight and judgment.  ____________________________________________    LABS (pertinent positives/negatives) (all labs ordered are listed, but only abnormal results are displayed) Labs Reviewed  COMPREHENSIVE METABOLIC PANEL - Abnormal; Notable for the following:    CO2 20 (*)    Glucose, Bld 138 (*)    All other components within normal limits  URINALYSIS COMPLETEWITH MICROSCOPIC ([facility] ONLY) - Abnormal; Notable for the following:    Color, Urine YELLOW (*)    APPearance CLEAR (*)     Squamous Epithelial / LPF 6-30 (*)    All other components within normal limits  URINE CULTURE  LIPASE, BLOOD  CBC WITH DIFFERENTIAL/PLATELET   ____________________________________________   EKG  Interpreted by me Sinus rhythm rate of 68, normal axis intervals QRS and ST segments and T waves  ____________________________________________    RADIOLOGY  CT abdomen and pelvis reveals 2 mm right renal stone, no obstruction.  No pyelonephritis.  ____________________________________________   PROCEDURES   ____________________________________________   INITIAL IMPRESSION / ASSESSMENT AND PLAN / ED COURSE  Pertinent labs & imaging results that were available during my care of the patient were reviewed by me and considered in my medical decision making (see chart for details).  Patient presents with severe right flank pain that is episodic. She scheduled for an outpatient noncontrast CT scan tomorrow due to her symptoms we'll go ahead and proceed with a CT scan today as well as recheck of her urine and labs. IV fluids, morphine and Zofran for symptom relief at this time.  ----------------------------------------- 12:59 PM on 08/07/2015 -----------------------------------------  Workup negative except for a 2 mm stone in the inferior pole of the right kidney. This may be causing her symptoms. We'll treat as a kidney stone and have her follow up with primary care. Vital signs are stable and normal. Pain is controlled, suitable for discharge home and outpatient follow-up.     ____________________________________________   FINAL CLINICAL IMPRESSION(S) / ED DIAGNOSES  Final diagnoses:  Right kidney stone   right flank pain    Carrie Mew, MD 08/07/15 1259

## 2015-08-08 ENCOUNTER — Ambulatory Visit: Payer: BLUE CROSS/BLUE SHIELD | Attending: Family Medicine

## 2015-08-09 LAB — URINE CULTURE

## 2015-08-12 ENCOUNTER — Encounter: Payer: Self-pay | Admitting: Family Medicine

## 2015-08-13 ENCOUNTER — Ambulatory Visit (INDEPENDENT_AMBULATORY_CARE_PROVIDER_SITE_OTHER): Payer: BLUE CROSS/BLUE SHIELD | Admitting: Urology

## 2015-08-13 ENCOUNTER — Encounter: Payer: Self-pay | Admitting: Urology

## 2015-08-13 VITALS — BP 114/74 | HR 91 | Ht 61.0 in | Wt 173.6 lb

## 2015-08-13 DIAGNOSIS — R109 Unspecified abdominal pain: Secondary | ICD-10-CM

## 2015-08-13 DIAGNOSIS — N200 Calculus of kidney: Secondary | ICD-10-CM

## 2015-08-13 DIAGNOSIS — R310 Gross hematuria: Secondary | ICD-10-CM

## 2015-08-13 DIAGNOSIS — R102 Pelvic and perineal pain: Secondary | ICD-10-CM

## 2015-08-13 HISTORY — DX: Pelvic and perineal pain: R10.2

## 2015-08-13 LAB — URINALYSIS, COMPLETE
Bilirubin, UA: NEGATIVE
GLUCOSE, UA: NEGATIVE
KETONES UA: NEGATIVE
LEUKOCYTES UA: NEGATIVE
NITRITE UA: NEGATIVE
Protein, UA: NEGATIVE
RBC, UA: NEGATIVE
Urobilinogen, Ur: 0.2 mg/dL (ref 0.2–1.0)
pH, UA: 5 (ref 5.0–7.5)

## 2015-08-13 LAB — MICROSCOPIC EXAMINATION: RBC MICROSCOPIC, UA: NONE SEEN /HPF (ref 0–?)

## 2015-08-13 MED ORDER — TRAMADOL HCL 50 MG PO TABS: 50.0000 mg | ORAL_TABLET | Freq: Four times a day (QID) | ORAL | Status: AC | PRN

## 2015-08-13 MED ORDER — ONDANSETRON 8 MG PO TBDP: 8.0000 mg | ORAL_TABLET | Freq: Three times a day (TID) | ORAL | Status: DC | PRN

## 2015-08-13 NOTE — Progress Notes (Signed)
08/13/2015 8:43 AM   Nicole Horton December 02, 1970 [id number]  Referring provider: No referring provider defined for this encounter.  Chief Complaint  Patient presents with  . New Patient (Initial Visit)    Nephrolithoasis, diagnose in [facility]    HPI: Patient is a 45 year old Caucasian female who is seen at [facility]  emergency department and diagnosed with a kidney stone and instructed to follow up with an urologist.  Patient states that 4 weeks ago she had the sudden onset of right-sided flank pain. She was also experiencing frequent urination, urgency, nocturia, intermittency, hesitancy and straining to urinate. She also has been noticing blood on her toilet paper when she wipes after urination. She did not have a primary care physician at that time, so she went to medicines urgent care and was told she had a UTI.   She was placed on a round of Bactrim which did not provide any relief to the pain.  Her urine culture from that visit was positive for Escherichia coli and it was sensitive to the Bactrim.    She was then seen by her primary care physician and was given the antibiotic Macrobid and repeated culture demonstrated multiple species presence.  She is then scheduled for a CT scan to evaluate for nephrolithiasis, but the pain became so severe she went to the emergency room to seek further care and management.  CT scan of the abdomen and pelvis performed with contrast on 08/07/2015 noted a 2 mm calculus in the lower pole of the right kidney. No ureteral calculi and no hydronephrosis was appreciated.  Patient stated she had not had a prior history of kidney stone disease.  Today, she states the pain is still present in the right flank area. She states it radiates up under the rib and down to her right buttock's.  She has not passed any fragments.  She has been experiencing nausea and vomiting with constipation. She has not had fevers or chills.  Her UA today was negative.  I have reviewed the  CT scan from 08/07/2015 and agree with the findings. I also reviewed these films with the patient.  I also reviewed previous CT scans the patient had and the right lower pole stone has been present since 2013 and has not changed position or size.  Also reviewing previous notes from other providers, she had been seen and evaluated for this stone one year ago with our office. She was told at that time the stone was not the cause of any pain she was having.    PMH: Past Medical History  Diagnosis Date  . Hyperlipidemia   . GERD (gastroesophageal reflux disease)   . Essential (primary) hypertension 07/31/2015  . 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 Abnormal uterine bleeding 05/15/2014    Overview:  [place]: shows 1. 97 cm, Lt cyst .88   . Adnexal pain 08/13/2015    Surgical History: Past Surgical History  Procedure Laterality Date  . Tubal ligation    . Cardiac surgery      Home Medications:    Medication List       This list is accurate as of: 08/13/15 11:59 PM.  Always use your most recent med list.               amLODipine 5 MG tablet  Commonly known as:  NORVASC  Take 5 mg by mouth daily.     aspirin EC 81 MG  tablet  Take by mouth. Reported on 08/13/2015     esomeprazole 20 MG capsule  Commonly known as:  NEXIUM  Take 20 mg by mouth daily at 12 noon.     lisinopril 5 MG tablet  Commonly known as:  PRINIVIL,ZESTRIL  Take by mouth. Reported on 08/13/2015     lovastatin 10 MG tablet  Commonly known as:  MEVACOR  Take 1 tablet by mouth daily.     Melatonin 3 MG Tabs  Take by mouth. Reported on 08/13/2015     MULTI-VITAMINS Tabs  Take by mouth. Reported on 08/13/2015     naproxen 500 MG tablet  Commonly known as:  NAPROSYN  Take 1 tablet (500 mg total) by mouth 2 (two) times daily with a meal.     nitrofurantoin (macrocrystal-monohydrate) 100 MG capsule  Commonly known as:  MACROBID  Take 1 capsule by mouth 2 (two) times daily. Reported  on 08/13/2015     ondansetron 8 MG disintegrating tablet  Commonly known as:  ZOFRAN ODT  Take 1 tablet (8 mg total) by mouth every 8 (eight) hours as needed for nausea or vomiting.     phenazopyridine 200 MG tablet  Commonly known as:  PYRIDIUM  Take 1 tablet (200 mg total) by mouth 3 (three) times daily as needed for pain.     promethazine 12.5 MG tablet  Commonly known as:  PHENERGAN  Reported on 08/13/2015     tamsulosin 0.4 MG Caps capsule  Commonly known as:  FLOMAX  Take 1 capsule (0.4 mg total) by mouth daily.     traMADol 50 MG tablet  Commonly known as:  ULTRAM  Take 1 tablet (50 mg total) by mouth every 6 (six) hours as needed.        Allergies:  Allergies  Allergen Reactions  . Codeine Nausea And Vomiting    Family History: Family History  Problem Relation Age of Onset  . Diabetes Mother   . Heart failure Mother   . Hematuria Neg Hx     Social History:  reports that she has been smoking Cigarettes.  She has been smoking about 0.50 packs per day. She does not have any smokeless tobacco history on file. She reports that she does not drink alcohol or use illicit drugs.  ROS: UROLOGY Frequent Urination?: Yes Hard to postpone urination?: Yes Burning/pain with urination?: No Get up at night to urinate?: Yes Leakage of urine?: No Urine stream starts and stops?: Yes Trouble starting stream?: Yes Do you have to strain to urinate?: Yes Blood in urine?: Yes Urinary tract infection?: No Sexually transmitted disease?: No Injury to kidneys or bladder?: No Painful intercourse?: No Weak stream?: No Currently pregnant?: No Vaginal bleeding?: No Last menstrual period?: n  Gastrointestinal Nausea?: Yes Vomiting?: Yes Indigestion/heartburn?: Yes Diarrhea?: No Constipation?: Yes  Constitutional Fever: No Night sweats?: No Weight loss?: No Fatigue?: Yes  Skin Skin rash/lesions?: No Itching?: No  Eyes Blurred vision?: No Double vision?:  No  Ears/Nose/Throat Sore throat?: Yes Sinus problems?: Yes  Hematologic/Lymphatic Swollen glands?: No Easy bruising?: No  Cardiovascular Leg swelling?: No Chest pain?: No  Respiratory Cough?: No Shortness of breath?: No  Endocrine Excessive thirst?: Yes  Musculoskeletal Back pain?: Yes Joint pain?: No  Neurological Headaches?: Yes Dizziness?: Yes  Psychologic Depression?: No Anxiety?: No  Physical Exam: BP 114/74 mmHg  Pulse 91  Ht 5\' 1"  (1.549 m)  Wt 173 lb 9.6 oz (78.744 kg)  BMI 32.82 kg/m2  LMP 08/03/2015  Constitutional:  Well nourished. Alert and oriented, Acute distress.  Unable to sit still.   HEENT: [place] AT, moist mucus membranes. Trachea midline, no masses. Cardiovascular: No clubbing, cyanosis, or edema. Respiratory: Normal respiratory effort, no increased work of breathing. GI: Abdomen is soft, non tender, non distended, no abdominal masses. Liver and spleen not palpable.  No hernias appreciated.  Stool sample for occult testing is not indicated.   GU: Right CVA tenderness.  No bladder fullness or masses.   Skin: No rashes, bruises or suspicious lesions. Lymph: No cervical or inguinal adenopathy. Neurologic: Grossly intact, no focal deficits, moving all 4 extremities. Psychiatric: Normal mood and affect.  Laboratory Data: Lab Results  Component Value Date   WBC 6.9 08/07/2015   HGB 14.1 08/07/2015   HCT 42.5 08/07/2015   MCV 89.0 08/07/2015   PLT 298 08/07/2015    Lab Results  Component Value Date   CREATININE 0.66 08/07/2015   Urinalysis Results for orders placed or performed in visit on 08/13/15  Microscopic Examination  Result Value Ref Range   WBC, UA 0-5 0 -  5 /hpf   RBC, UA None seen 0 -  2 /hpf   Epithelial Cells (non renal) 0-10 0 - 10 /hpf   Mucus, UA Present (A) Not Estab.   Bacteria, UA Few (A) None seen/Few  Urinalysis, Complete  Result Value Ref Range   Specific Gravity, UA >1.030 (H) 1.005 - 1.030   pH, UA 5.0 5.0  - 7.5   Color, UA Yellow Yellow   Appearance Ur Cloudy (A) Clear   Leukocytes, UA Negative Negative   Protein, UA Negative Negative/Trace   Glucose, UA Negative Negative   Ketones, UA Negative Negative   RBC, UA Negative Negative   Bilirubin, UA Negative Negative   Urobilinogen, Ur 0.2 0.2 - 1.0 mg/dL   Nitrite, UA Negative Negative   Microscopic Examination See below:     Pertinent Imaging: CLINICAL DATA: Two week history of right flank pain, progressed for past day  EXAM: CT ABDOMEN AND PELVIS WITH CONTRAST  TECHNIQUE: Multidetector CT imaging of the abdomen and pelvis was performed using the standard protocol following bolus administration of intravenous contrast. Oral contrast also administered.  CONTRAST: 162mL OMNIPAQUE IOHEXOL 350 MG/ML SOLN  COMPARISON: July 02, 2014  FINDINGS: Lower chest: There is mild atelectasis in the posterior right base. There is a small bulla in the posterior left base. There is no edema or consolidation in the visualized lung bases. There is a calcified granuloma inferior right base.  Hepatobiliary: Liver is prominent, measuring 18.5 cm in length. No focal liver lesions are identified. Gallbladder wall is not appreciably thickened. There is no biliary duct dilatation.  Pancreas: No pancreatic mass or inflammatory focus.  Spleen: No splenic lesions are identified.  Adrenals/Urinary Tract: Adrenals appear normal bilaterally. There is a 2 mm calculus in the lower pole of the right kidney. There is no renal mass on either side. Each kidney has an extrarenal pelvis, an anatomic variant. There is no appreciable hydronephrosis on either side. There is no perinephric stranding on either side. There are no calculi in the left kidney. There are no ureteral calculi on either side. Urinary bladder is midline with wall thickness within normal limits.  Stomach/Bowel: There are scattered sigmoid diverticula  without diverticulitis. Much of the colon is collapsed ; there is no demonstrable colonic inflammation. There is no colonic wall enhancement, and there is no surrounding mesenteric stranding/thickening. There is no small bowel wall thickening. No bowel  obstruction. No free air or portal venous air.  Vascular/Lymphatic: There is no abdominal aortic aneurysm. The major mesenteric vessels appear patent. There is no demonstrable adenopathy in the abdomen or pelvis by size criteria. There are scattered mid abdominal subcentimeter mesenteric lymph nodes.  Reproductive: Uterus is anteverted. There is no pelvic mass or pelvic fluid collection appreciable.  Other: Appendix appears normal. There is no abscess or ascites in the abdomen or pelvis.  Musculoskeletal: There are no blastic or lytic bone lesions. There are no intramuscular or abdominal wall lesions.  IMPRESSION: 2 mm calculus lower pole right kidney. No ureteral calculi. No appreciable hydronephrosis.  No adenopathy by size criteria. Scattered subcentimeter mesenteric lymph nodes in the mid abdomen are noted. This finding potentially could indicate a degree of mesenteric adenitis. Based solely on the imaging findings, however, these lymph nodes must be viewed as nonspecific.  No bowel obstruction. No abscess or mesenteric thickening. Appendix appears normal. There are scattered sigmoid diverticula without diverticulitis.  Prominent liver without focal lesion appreciable.   Electronically Signed  By: Lowella Grip III M.D.  On: 08/07/2015 12:41   Assessment & Plan:    1. Right flank pain:    I explained to the patient that her right flank pain is most likely not caused by the small 2 mm right lower pole stone.  There was no hydronephrosis associated with this stone and the stone has been present since 2013.  It has been stable in size and location since that time.  Her right flank pain began one month ago.   I did refill her tramadol prescription today for 10 tablets.    - Urinalysis, Complete - CULTURE, URINE COMPREHENSIVE  2. Gross hematuria:   Explained to patient the causes of blood in the urine are as follows: stones, UTI's, damage to the urinary tract and/or cancer.  It is explained to the patient that they will be scheduled for a CT Urogram with contrast material and that in rare instances, an allergic reaction can be serious and even life threatening with the injection of contrast material.   The patient denies any allergies to contrast, iodine and/or seafood and is not taking metformin.  I have explained to the patient that they will  be scheduled for a cystoscopy in our office to evaluate their bladder.  The cystoscopy consists of passing a tube with a lens up through their urethra and into their urinary bladder.   We will inject the urethra with a lidocaine gel prior to introducing the cystoscope to help with any discomfort during the procedure.   After the procedure, they might experience blood in the urine and discomfort with urination.  This will abate after the first few voids.  I have  encouraged the patient to increase water intake  during this time.  Patient denies any allergies to lidocaine.    3. Nephrolithiasis:   Patient has a right 2 mm lower pole stone which is stable in location and size since 2013. Her stone composition is not known. She has not underwent 24-hour urine analysis.  Return for CT Urogram and cystoscopy.  Zara Council, [facility] [address], [place], [place] [address] [phone]

## 2015-08-14 ENCOUNTER — Telehealth: Payer: Self-pay | Admitting: Urology

## 2015-08-14 DIAGNOSIS — N200 Calculus of kidney: Secondary | ICD-10-CM | POA: Insufficient documentation

## 2015-08-14 DIAGNOSIS — R310 Gross hematuria: Secondary | ICD-10-CM | POA: Insufficient documentation

## 2015-08-14 NOTE — Telephone Encounter (Signed)
Please send a copy of my note (05/12/2016) to Dr. Sherrin Daisy, MD.

## 2015-08-15 LAB — CULTURE, URINE COMPREHENSIVE

## 2015-08-18 ENCOUNTER — Ambulatory Visit: Payer: BLUE CROSS/BLUE SHIELD

## 2015-08-18 ENCOUNTER — Ambulatory Visit
Admission: RE | Admit: 2015-08-18 | Discharge: 2015-08-18 | Disposition: A | Discharge status: Home / Self Care | Payer: BLUE CROSS/BLUE SHIELD | Source: Ambulatory Visit | Attending: Urology | Admitting: Urology

## 2015-08-18 DIAGNOSIS — N2889 Other specified disorders of kidney and ureter: Secondary | ICD-10-CM | POA: Insufficient documentation

## 2015-08-18 DIAGNOSIS — R310 Gross hematuria: Secondary | ICD-10-CM | POA: Diagnosis present

## 2015-08-18 DIAGNOSIS — N200 Calculus of kidney: Secondary | ICD-10-CM | POA: Diagnosis not present

## 2015-08-18 MED ORDER — IOHEXOL 300 MG/ML  SOLN
150.0000 mL | Freq: Once | INTRAMUSCULAR | Status: AC | PRN
  Administered 2015-08-18: 150 mL via INTRAVENOUS

## 2015-08-19 ENCOUNTER — Encounter: Payer: Self-pay | Admitting: Urology

## 2015-08-19 ENCOUNTER — Ambulatory Visit (INDEPENDENT_AMBULATORY_CARE_PROVIDER_SITE_OTHER): Payer: BLUE CROSS/BLUE SHIELD | Admitting: Urology

## 2015-08-19 VITALS — BP 116/89 | HR 89 | Ht 61.5 in | Wt 176.2 lb

## 2015-08-19 DIAGNOSIS — R310 Gross hematuria: Secondary | ICD-10-CM | POA: Diagnosis not present

## 2015-08-19 LAB — URINALYSIS, COMPLETE
Bilirubin, UA: NEGATIVE
GLUCOSE, UA: NEGATIVE
Ketones, UA: NEGATIVE
Nitrite, UA: NEGATIVE
PROTEIN UA: NEGATIVE
Specific Gravity, UA: 1.02 (ref 1.005–1.030)
UUROB: 0.2 mg/dL (ref 0.2–1.0)
pH, UA: 5 (ref 5.0–7.5)

## 2015-08-19 LAB — MICROSCOPIC EXAMINATION: Epithelial Cells (non renal): >10 /hpf — ABNORMAL HIGH (ref 0–10)

## 2015-08-19 MED ORDER — FLUCONAZOLE 150 MG PO TABS: 150.0000 mg | ORAL_TABLET | Freq: Once | ORAL | Status: DC

## 2015-08-19 NOTE — Procedures (Signed)
    Cystoscopy Procedure Note  Patient identification was confirmed, informed consent was obtained, and patient was prepped using Betadine solution.  Lidocaine jelly was administered per urethral meatus.    Preoperative abx where received prior to procedure.    Procedure: - Flexible cystoscope introduced, without any difficulty.   - Thorough search of the bladder revealed:    normal urethral meatus    normal urothelium    no stones    no ulcers     no tumors    no urethral polyps    no trabeculation  - Ureteral orifices were normal in position and appearance.  Post-Procedure: - Patient tolerated the procedure well  Impression: I suspect the patient's back pain is musculoskeletal. I recommended that she take 800 mg of ibuprofen 3 times a day 2 weeks. In addition, I recommended the patient use a heating pad in the mornings and [place] rest and ice at the end of the day. She also is constipated and would benefit from a bowel cleanout.  The workup for the patient's hematuria has been largely unremarkable. She does have a nonobstructing stone in her right kidney which is been present for several years now and is not the source or back pain or her gross hematuria. It may have been from her UTI. However, there is no pathologic or malignant process concerning to her hematuria.  Recommendations: Having been thoroughly evaluated for gross hematuria with no clear etiology, I recommend the patient return to [place] on an as-needed basis. She should continue to have urinalysis on a yearly basis. If she still has hematuria in the next 3-5 years, the workup should be repeated.

## 2015-08-21 ENCOUNTER — Encounter: Payer: Self-pay | Admitting: Urology

## 2015-08-22 NOTE — Telephone Encounter (Signed)
Done   Thanks, michelle

## 2016-09-27 DIAGNOSIS — R002 Palpitations: Secondary | ICD-10-CM | POA: Insufficient documentation

## 2016-10-20 ENCOUNTER — Encounter: Payer: Self-pay | Admitting: Obstetrics & Gynecology

## 2016-12-29 IMAGING — CT CT ABD-PELV W/ CM
2 of 5 series · 14 of 46 positions shown, 16 images · IV contrast (omnipaque)
Comparison: July 02, 2014

CLINICAL DATA: Two week history of right flank pain, progressed for
past day

EXAM:
CT ABDOMEN AND PELVIS WITH CONTRAST
TECHNIQUE: Multidetector CT imaging of the abdomen and pelvis was performed
using the standard protocol following bolus administration of
intravenous contrast. Oral contrast also administered.
CONTRAST:  100mL OMNIPAQUE IOHEXOL 350 MG/ML SOLN

[Series 2: routine abd pel with · axial · 0.64mm/px · z∈[-482,-77]mm · 11 of 91 slices shown, 13 images]
[im 5/91  soft-tissue]
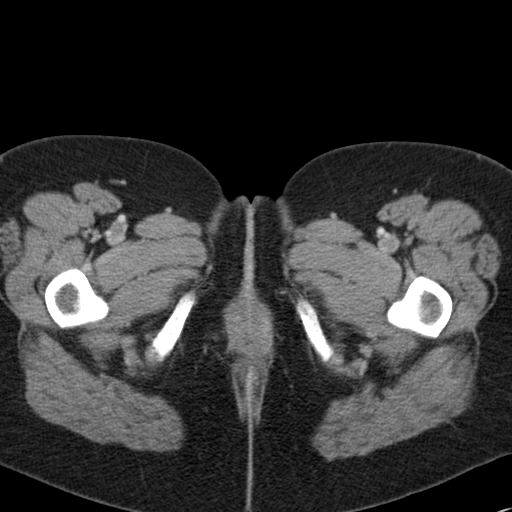
[im 5/91  bone]
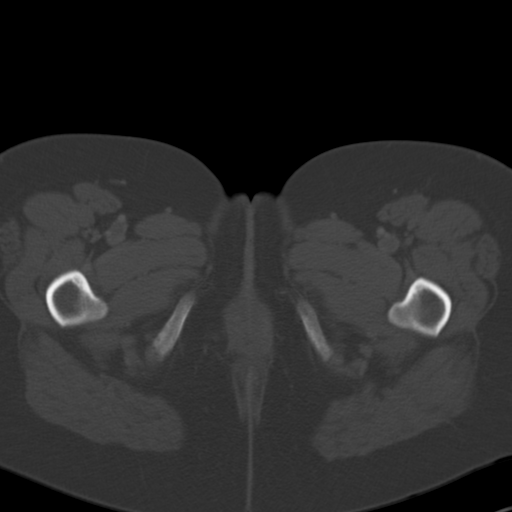
[im 15/91  soft-tissue]
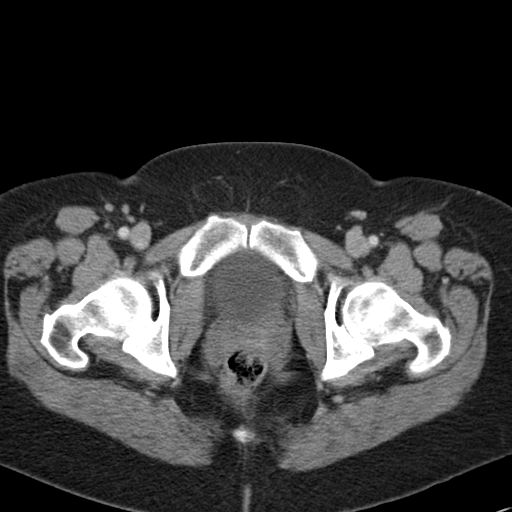
[im 24/91  soft-tissue]
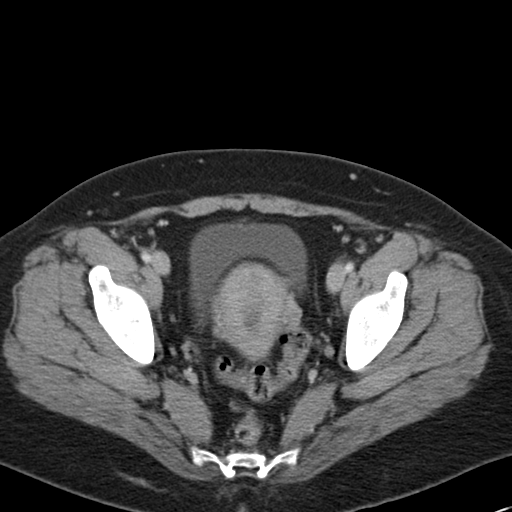
[im 29/91  soft-tissue]
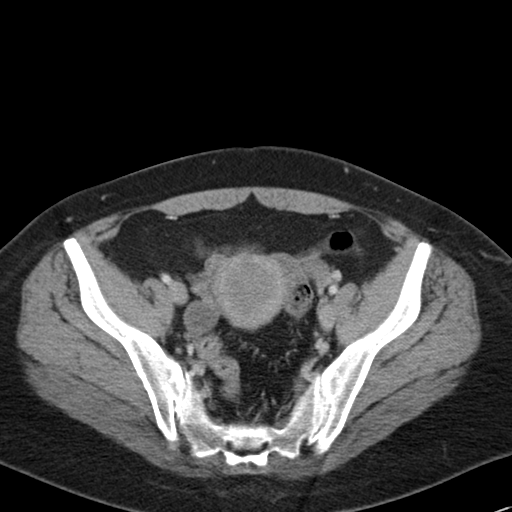
[im 38/91  soft-tissue]
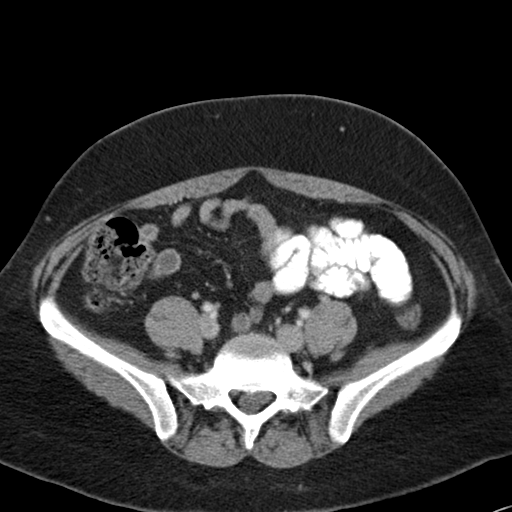
[im 48/91  soft-tissue]
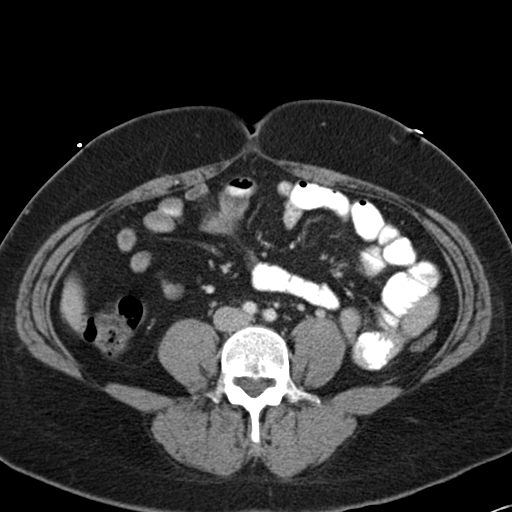
[im 53/91  soft-tissue]
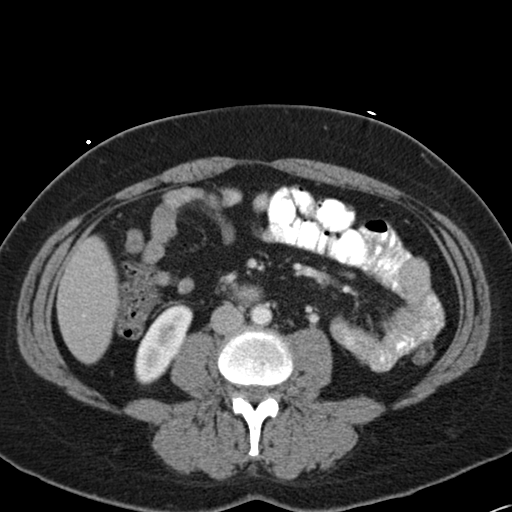
[im 62/91  soft-tissue]
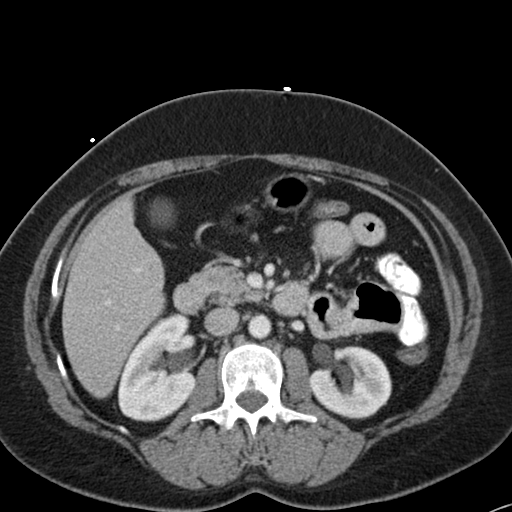
[im 67/91  soft-tissue]
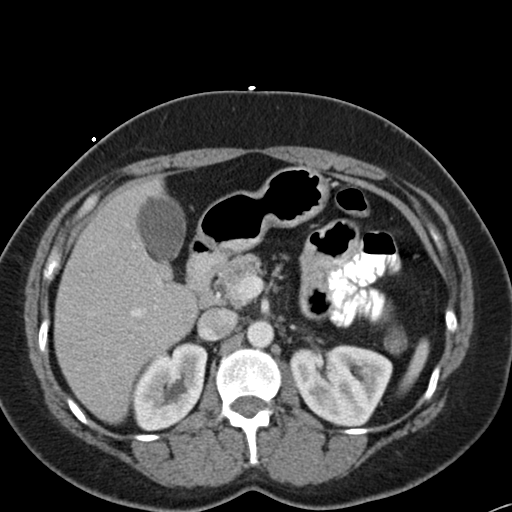
[im 67/91  bone]
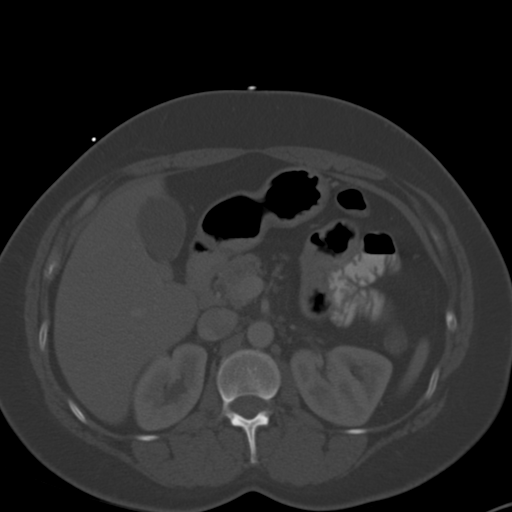
[im 76/91  soft-tissue]
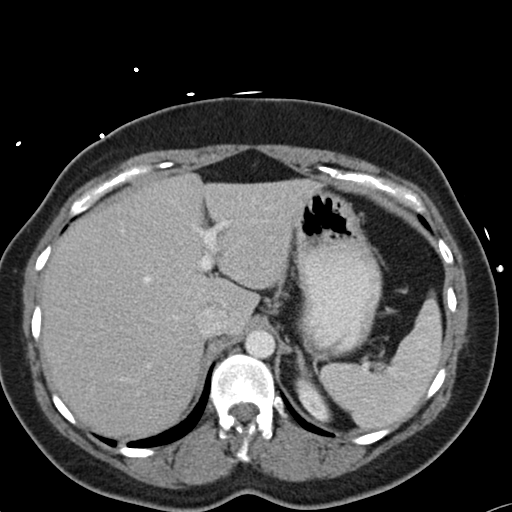
[im 86/91  soft-tissue]
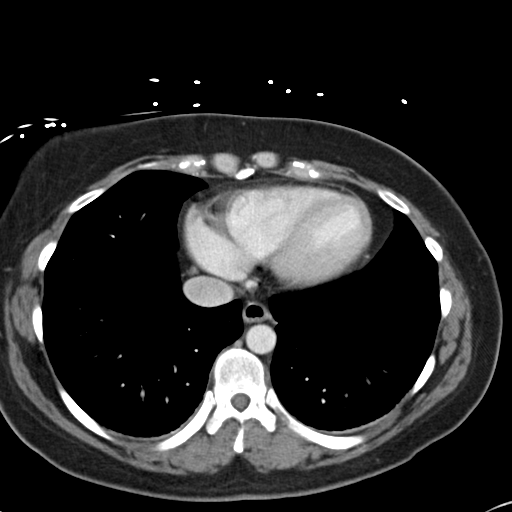

[Series 6: cor routine abd pel with · coronal · 0.59mm/px · 3 of 120 slices shown]
[im 40/120  soft-tissue]
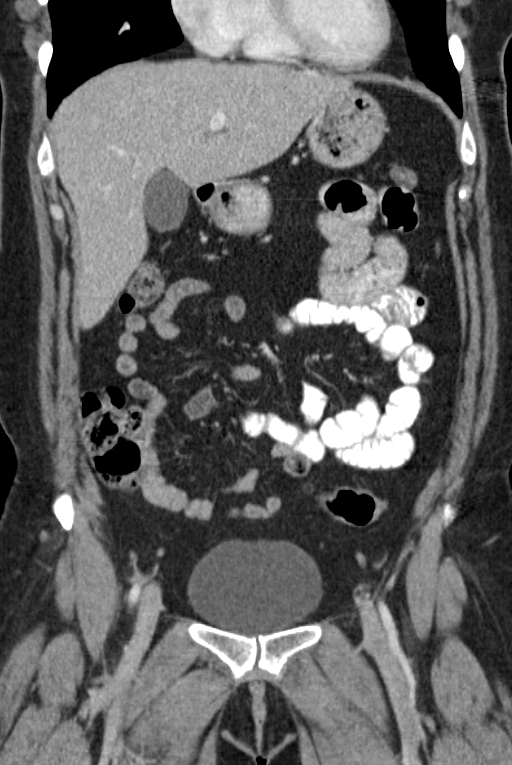
[im 53/120  soft-tissue]
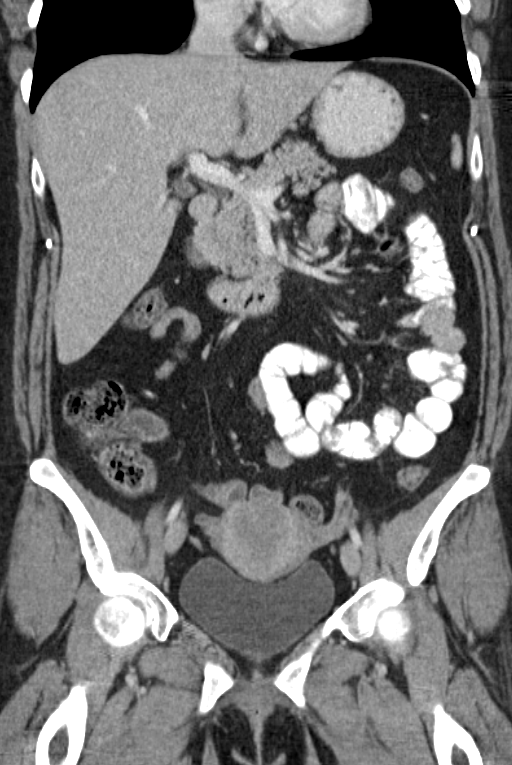
[im 67/120  soft-tissue]
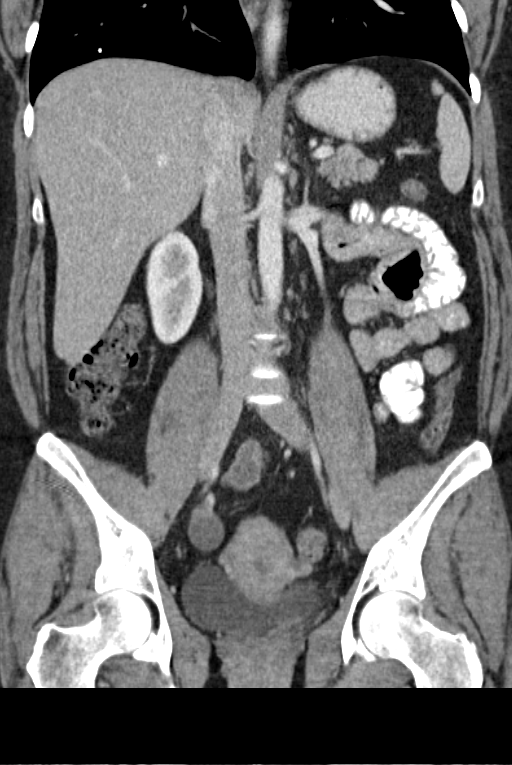

[14 of 46 positions shown; findings below may reference images not displayed]

FINDINGS: Lower chest: There is mild atelectasis in the posterior right base.
There is a small bulla in the posterior left base. There is no edema
or consolidation in the visualized lung bases. There is a calcified
granuloma inferior right base.

Hepatobiliary: Liver is prominent, measuring 18.5 cm in length. No
focal liver lesions are identified. Gallbladder wall is not
appreciably thickened. There is no biliary duct dilatation.

Pancreas: No pancreatic mass or inflammatory focus.

Spleen: No splenic lesions are identified.

Adrenals/Urinary Tract: Adrenals appear normal bilaterally. There is
a 2 mm calculus in the lower pole of the right kidney. There is no
renal mass on either side. Each kidney has an extrarenal pelvis, an
anatomic variant. There is no appreciable hydronephrosis on either
side. There is no perinephric stranding on either side. There are no
calculi in the left kidney. There are no ureteral calculi on either
side. Urinary bladder is midline with wall thickness within normal
limits.

Stomach/Bowel: There are scattered sigmoid diverticula without
diverticulitis. Much of the colon is collapsed ; there is no
demonstrable colonic inflammation. There is no colonic wall
enhancement, and there is no surrounding mesenteric
stranding/thickening. There is no small bowel wall thickening. No
bowel obstruction. No free air or portal venous air.

Vascular/Lymphatic: There is no abdominal aortic aneurysm. The major
mesenteric vessels appear patent. There is no demonstrable
adenopathy in the abdomen or pelvis by size criteria. There are
scattered mid abdominal subcentimeter mesenteric lymph nodes.

Reproductive: Uterus is anteverted. There is no pelvic mass or
pelvic fluid collection appreciable.

Other: Appendix appears normal. There is no abscess or ascites in
the abdomen or pelvis.

Musculoskeletal: There are no blastic or lytic bone lesions. There
are no intramuscular or abdominal wall lesions.
IMPRESSION: 2 mm calculus lower pole right kidney. No ureteral calculi. No
appreciable hydronephrosis.

No adenopathy by size criteria. Scattered subcentimeter mesenteric
lymph nodes in the mid abdomen are noted. This finding potentially
could indicate a degree of mesenteric adenitis. Based solely on the
imaging findings, however, these lymph nodes must be viewed as
nonspecific.

No bowel obstruction. No abscess or mesenteric thickening. Appendix
appears normal. There are scattered sigmoid diverticula without
diverticulitis.

Prominent liver without focal lesion appreciable.

## 2017-03-09 ENCOUNTER — Emergency Department
Admission: EM | Admit: 2017-03-09 | Discharge: 2017-03-09 | Disposition: A | Discharge status: Home / Self Care | Payer: BLUE CROSS/BLUE SHIELD | Attending: Emergency Medicine | Admitting: Emergency Medicine

## 2017-03-09 ENCOUNTER — Encounter: Payer: Self-pay | Admitting: Emergency Medicine

## 2017-03-09 DIAGNOSIS — I10 Essential (primary) hypertension: Secondary | ICD-10-CM | POA: Insufficient documentation

## 2017-03-09 DIAGNOSIS — N760 Acute vaginitis: Secondary | ICD-10-CM | POA: Insufficient documentation

## 2017-03-09 DIAGNOSIS — Z79899 Other long term (current) drug therapy: Secondary | ICD-10-CM | POA: Insufficient documentation

## 2017-03-09 DIAGNOSIS — F17210 Nicotine dependence, cigarettes, uncomplicated: Secondary | ICD-10-CM | POA: Insufficient documentation

## 2017-03-09 DIAGNOSIS — N938 Other specified abnormal uterine and vaginal bleeding: Secondary | ICD-10-CM | POA: Insufficient documentation

## 2017-03-09 LAB — URINALYSIS, COMPLETE (UACMP) WITH MICROSCOPIC
BACTERIA UA: NONE SEEN
Bilirubin Urine: NEGATIVE
Glucose, UA: NEGATIVE mg/dL
Ketones, ur: NEGATIVE mg/dL
NITRITE: NEGATIVE
Protein, ur: NEGATIVE mg/dL
SPECIFIC GRAVITY, URINE: 1.016 (ref 1.005–1.030)
pH: 5 (ref 5.0–8.0)

## 2017-03-09 LAB — CHLAMYDIA/NGC RT PCR (ARMC ONLY)
CHLAMYDIA TR: NOT DETECTED
N gonorrhoeae: NOT DETECTED

## 2017-03-09 LAB — WET PREP, GENITAL
Clue Cells Wet Prep HPF POC: NONE SEEN
SPERM: NONE SEEN
TRICH WET PREP: NONE SEEN
YEAST WET PREP: NONE SEEN

## 2017-03-09 LAB — POCT PREGNANCY, URINE: Preg Test, Ur: NEGATIVE

## 2017-03-09 MED ORDER — FLUCONAZOLE 50 MG PO TABS
150.0000 mg | ORAL_TABLET | Freq: Once | ORAL | Status: AC
  Administered 2017-03-09: 150 mg via ORAL
  Filled 2017-03-09: qty 1

## 2017-03-09 NOTE — ED Triage Notes (Signed)
Pt in with co vaginal itching and irritation since yest, hx of yeast infections. Has used vagisil without relief.

## 2017-03-10 NOTE — ED Provider Notes (Signed)
[facility] Emergency Department Provider Note    First MD Initiated Contact with Patient 03/09/17 [phone]     (approximate)  I have reviewed the triage vital signs and the nursing notes.   HISTORY  Chief Complaint Vaginal Itching    HPI Nicole Horton is a 46 y.o. female with blow list of chronic medical conditions presents to the emergency department with acute onset of vaginal itching and "burning/irritation since yesterday. The patient states that she's used Vagisil without any relief. Patient states symptoms started after taking a bath with a scented Epsom salt. Patient denies any dysuria no fever no back pain. Patient admits to dark blood vaginal discharge. Patient states that her menses are very irregular at this time secondary to the fact that she believes she is entering menopause.Patient admits to previous episodes of yeast infections however states that she has not had the discharge consistent with previous episodes of yeast infection. Patient states that there is dark blood   Past Medical History:  Diagnosis Date  . Abnormal uterine bleeding 05/15/2014   Overview:  [place]: shows 1. 97 cm, Lt cyst .88   . Adnexal pain 08/13/2015  . Essential (primary) hypertension 07/31/2015  . GERD (gastroesophageal reflux disease)   . Hyperlipidemia   . 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 Nephrolithiasis 08/14/2015  . Gross hematuria 08/14/2015  . Adnexal pain 08/13/2015  . Essential (primary) hypertension 07/31/2015  . Abnormal uterine bleeding 05/15/2014  . 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 CARDIAC SURGERY    . TUBAL LIGATION      Prior to Admission medications   Medication Sig Start Date End Date Taking? Authorizing Provider  amLODipine (NORVASC) 5 MG tablet Take 5 mg by mouth daily.     [provider]  aspirin EC 81 MG tablet Take by mouth. Reported on 08/13/2015    [provider]  esomeprazole (NEXIUM) 20 MG capsule Take 20 mg by mouth daily at 12 noon.    [provider]  fluconazole (DIFLUCAN) 150 MG tablet Take 1 tablet (150 mg total) by mouth once. 08/19/15   Ardis Hughs, MD  lisinopril (PRINIVIL,ZESTRIL) 5 MG tablet Take by mouth. Reported on 08/13/2015    [provider]  lovastatin (MEVACOR) 10 MG tablet Take 1 tablet by mouth daily. 07/11/15   [provider]  Melatonin 3 MG TABS Take by mouth. Reported on 08/13/2015    [provider]  Multiple Vitamin (MULTI-VITAMINS) TABS Take by mouth. Reported on 08/13/2015    [provider]  naproxen (NAPROSYN) 500 MG tablet Take 1 tablet (500 mg total) by mouth 2 (two) times daily with a meal. 08/07/15   Carrie Mew, MD  nitrofurantoin, macrocrystal-monohydrate, (MACROBID) 100 MG capsule Take 1 capsule by mouth 2 (two) times daily. Reported on 08/19/2015 07/31/15   [provider]  ondansetron (ZOFRAN ODT) 8 MG disintegrating tablet Take 1 tablet (8 mg total) by mouth every 8 (eight) hours as needed for nausea or vomiting. Patient not taking: Reported on 08/19/2015 08/13/15   Zara Council A, PA-C  phenazopyridine (PYRIDIUM) 200 MG tablet Take 1 tablet (200 mg total) by mouth 3 (three) times daily as needed for pain. Patient not taking: Reported on 08/13/2015 08/07/15   Carrie Mew, MD  tamsulosin (FLOMAX) 0.4 MG CAPS  capsule Take 1 capsule (0.4 mg total) by mouth daily. 08/07/15   Carrie Mew, MD    Allergies Codeine  Family History  Problem Relation Age of Onset  . Diabetes Mother   . Heart failure Mother   . Hematuria Neg Hx     Social History Social History  Substance Use Topics  . Smoking status: Current Every Day Smoker    Packs/day: 0.50    Types: Cigarettes  . Smokeless tobacco: Not on file  . Alcohol use No    Review  of Systems Constitutional: No fever/chills Eyes: No visual changes. ENT: No sore throat. Cardiovascular: Denies chest pain. Respiratory: Denies shortness of breath. Gastrointestinal: No abdominal pain.  No nausea, no vomiting.  No diarrhea.  No constipation. Genitourinary: Negative for dysuria.Positive for vaginal burning and irritation Musculoskeletal: Negative for neck pain.  Negative for back pain. Integumentary: Negative for rash. Neurological: Negative for headaches, focal weakness or numbness.   ____________________________________________   PHYSICAL EXAM:  VITAL SIGNS: ED Triage Vitals  Enc Vitals Group     BP 03/09/17 0235 (!) 143/68     Pulse Rate 03/09/17 0235 80     Resp 03/09/17 0235 20     Temp 03/09/17 0235 98.1 F (36.7 C)     Temp Source 03/09/17 0235 Oral     SpO2 03/09/17 0235 96 %     Weight 03/09/17 0236 74.8 kg (165 lb)     Height 03/09/17 0236 1.549 m (5\' 1" )     Head Circumference --      Peak Flow --      Pain Score 03/09/17 0235 7     Pain Loc --      Pain Edu? --      Excl. in [place]? --     Constitutional: Alert and oriented. Well appearing and in no acute distress. Eyes: Conjunctivae are normal.  Head: Atraumatic. Mouth/Throat: Mucous membranes are moist. Neck: No stridor.  Cardiovascular: Normal rate, regular rhythm. Good peripheral circulation. Grossly normal heart sounds. Respiratory: Normal respiratory effort.  No retractions. Lungs CTAB. Gastrointestinal: Soft and nontender. No distention.  Genitourinary: Dark blood vaginal discharge scant amount also noted exiting the cervical os Neurologic:  Normal speech and language. No gross focal neurologic deficits are appreciated.  Skin:  Skin is warm, dry and intact. No rash noted.   ____________________________________________   LABS (all labs ordered are listed, but only abnormal results are displayed)  Labs Reviewed  WET PREP, GENITAL - Abnormal; Notable for the following:       Result  Value   WBC, Wet Prep HPF POC RARE (*)    All other components within normal limits  URINALYSIS, COMPLETE (UACMP) WITH MICROSCOPIC - Abnormal; Notable for the following:    Color, Urine YELLOW (*)    APPearance CLEAR (*)    Hgb urine dipstick LARGE (*)    Leukocytes, UA TRACE (*)    Squamous Epithelial / LPF 0-5 (*)    All other components within normal limits  CHLAMYDIA/NGC RT PCR ([facility] ONLY)  POCT PREGNANCY, URINE    Procedures   ____________________________________________   INITIAL IMPRESSION / ASSESSMENT AND PLAN / ED COURSE  Pertinent labs & imaging results that were available during my care of the patient were reviewed by me and considered in my medical decision making (see chart for details).  History of physical exam consistent with dysfunctional uterine bleeding (menses). In addition patient's vaginal irritation most likely secondary to vaginitis most likely secondary to back  that the patient took with scented Epsom salts.      ____________________________________________  FINAL CLINICAL IMPRESSION(S) / ED DIAGNOSES  Final diagnoses:  Acute vaginitis  Dysfunctional uterine bleeding     MEDICATIONS GIVEN DURING THIS VISIT:  Medications  fluconazole (DIFLUCAN) tablet 150 mg (150 mg Oral Given 03/09/17 0344)     NEW OUTPATIENT MEDICATIONS STARTED DURING THIS VISIT:  Discharge Medication List as of 03/09/2017  4:17 AM      Discharge Medication List as of 03/09/2017  4:17 AM      Discharge Medication List as of 03/09/2017  4:17 AM       Note:  This document was prepared using Dragon voice recognition software and may include unintentional dictation errors.    Gregor Hams, MD 03/10/17 [phone]

## 2017-04-22 ENCOUNTER — Encounter: Payer: Self-pay | Admitting: Emergency Medicine

## 2017-04-22 ENCOUNTER — Emergency Department: Payer: Self-pay

## 2017-04-22 ENCOUNTER — Emergency Department
Admission: EM | Admit: 2017-04-22 | Discharge: 2017-04-22 | Disposition: A | Discharge status: Home / Self Care | Payer: Self-pay | Attending: Emergency Medicine | Admitting: Emergency Medicine

## 2017-04-22 DIAGNOSIS — R197 Diarrhea, unspecified: Secondary | ICD-10-CM | POA: Insufficient documentation

## 2017-04-22 DIAGNOSIS — Z7982 Long term (current) use of aspirin: Secondary | ICD-10-CM | POA: Insufficient documentation

## 2017-04-22 DIAGNOSIS — R319 Hematuria, unspecified: Secondary | ICD-10-CM | POA: Insufficient documentation

## 2017-04-22 DIAGNOSIS — R112 Nausea with vomiting, unspecified: Secondary | ICD-10-CM | POA: Insufficient documentation

## 2017-04-22 DIAGNOSIS — Z79899 Other long term (current) drug therapy: Secondary | ICD-10-CM | POA: Insufficient documentation

## 2017-04-22 DIAGNOSIS — I10 Essential (primary) hypertension: Secondary | ICD-10-CM | POA: Insufficient documentation

## 2017-04-22 DIAGNOSIS — R109 Unspecified abdominal pain: Secondary | ICD-10-CM | POA: Insufficient documentation

## 2017-04-22 DIAGNOSIS — F17210 Nicotine dependence, cigarettes, uncomplicated: Secondary | ICD-10-CM | POA: Insufficient documentation

## 2017-04-22 LAB — COMPREHENSIVE METABOLIC PANEL
ALBUMIN: 4.1 g/dL (ref 3.5–5.0)
ALT: 26 U/L (ref 14–54)
ANION GAP: 8 (ref 5–15)
AST: 20 U/L (ref 15–41)
Alkaline Phosphatase: 55 U/L (ref 38–126)
BILIRUBIN TOTAL: 0.3 mg/dL (ref 0.3–1.2)
BUN: 10 mg/dL (ref 6–20)
CALCIUM: 9.4 mg/dL (ref 8.9–10.3)
CO2: 26 mmol/L (ref 22–32)
Chloride: 103 mmol/L (ref 101–111)
Creatinine, Ser: 0.84 mg/dL (ref 0.44–1.00)
GFR calc non Af Amer: >60 mL/min (ref >60)
GLUCOSE: 106 mg/dL — AB (ref 65–99)
POTASSIUM: 4.3 mmol/L (ref 3.5–5.1)
SODIUM: 137 mmol/L (ref 135–145)
TOTAL PROTEIN: 7.6 g/dL (ref 6.5–8.1)

## 2017-04-22 LAB — URINALYSIS, COMPLETE (UACMP) WITH MICROSCOPIC
Bilirubin Urine: NEGATIVE
Glucose, UA: NEGATIVE mg/dL
KETONES UR: NEGATIVE mg/dL
Leukocytes, UA: NEGATIVE
NITRITE: NEGATIVE
PROTEIN: NEGATIVE mg/dL
Specific Gravity, Urine: 1.016 (ref 1.005–1.030)
pH: 5 (ref 5.0–8.0)

## 2017-04-22 LAB — CBC
HEMATOCRIT: 42.9 % (ref 35.0–47.0)
Hemoglobin: 14.6 g/dL (ref 12.0–16.0)
MCH: 30.9 pg (ref 26.0–34.0)
MCHC: 34.2 g/dL (ref 32.0–36.0)
MCV: 90.4 fL (ref 80.0–100.0)
Platelets: 283 10*3/uL (ref 150–440)
RBC: 4.74 MIL/uL (ref 3.80–5.20)
RDW: 13.3 % (ref 11.5–14.5)
WBC: 8.7 10*3/uL (ref 3.6–11.0)

## 2017-04-22 LAB — PREGNANCY, URINE: PREG TEST UR: NEGATIVE

## 2017-04-22 LAB — LIPASE, BLOOD: Lipase: 32 U/L (ref 11–51)

## 2017-04-22 MED ORDER — PHENAZOPYRIDINE HCL 200 MG PO TABS: 200.0000 mg | ORAL_TABLET | Freq: Three times a day (TID) | ORAL | 0 refills | Status: DC | PRN

## 2017-04-22 MED ORDER — FLUCONAZOLE 100 MG PO TABS
100.0000 mg | ORAL_TABLET | Freq: Once | ORAL | Status: AC
  Administered 2017-04-22: 100 mg via ORAL

## 2017-04-22 MED ORDER — FLUCONAZOLE 100 MG PO TABS
ORAL_TABLET | ORAL | Status: AC
  Filled 2017-04-22: qty 1

## 2017-04-22 MED ORDER — CIPROFLOXACIN HCL 500 MG PO TABS: 500.0000 mg | ORAL_TABLET | Freq: Two times a day (BID) | ORAL | 0 refills | Status: AC

## 2017-04-22 NOTE — Discharge Instructions (Signed)
Please return for worse pain fever vomiting or feeling sicker. Take the Cipro one pill twice a day. She had burning when you urinate you can use the Pyridium one pill 3 times a day for a few days second make the urine orange. Please make sure you follow up with [facility] clinic as planned

## 2017-04-22 NOTE — ED Notes (Signed)
Reviewed d/c instructions, follow-up care, prescriptions with patient. Pt verbalized understanding.   Patient reported Cipro does not work for her. MD Malinda changed prescription to Levaquin.   Pt reported antibiotics give her yeast infections. MD Cinda Quest ordered Difulcan for patient to take at home

## 2017-04-22 NOTE — ED Triage Notes (Signed)
Pt thinks has UTI. Normally able to tell.  C/o lower abdominal pain which she normally does not have with UTI but thinks it may be bad. No fevers. No dysuria.  Reports has short attention span lately and that also happens with UTI.  No NVD.  Pain is across entire lower abdomen and lower back.

## 2017-04-22 NOTE — ED Provider Notes (Addendum)
[facility] Emergency Department Provider Note   ____________________________________________   First MD Initiated Contact with Patient 04/22/17 [phone]     (approximate)  I have reviewed the triage vital signs and the nursing notes.   HISTORY  Chief Complaint Abdominal Pain    HPI Nicole Horton is a 46 y.o. female Patient complains of lower abdominal pain and low back pain. She has some dysuria no fever although she told the nurse she had no dysuria. Patient thinks she might have a UTI. On exam patient had some lower abdominal pain no CVA tenderness. I was worried about a kidney stone since there is a lot of hematuria patient initially agreed to have a CT but now does not want a CT just wants antibiotics and to go home. She has a follow-up appointment with [facility] clinic within the week and wants to get further evaluation there.this pain is nowhere near as severe as her kidney stone pain.   Past Medical History:  Diagnosis Date  . Abnormal uterine bleeding 05/15/2014   Overview:  [place]: shows 1. 97 cm, Lt cyst .88   . Adnexal pain 08/13/2015  . Essential (primary) hypertension 07/31/2015  . GERD (gastroesophageal reflux disease)   . Hyperlipidemia   . 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 Nephrolithiasis 08/14/2015  . Gross hematuria 08/14/2015  . Adnexal pain 08/13/2015  . Essential (primary) hypertension 07/31/2015  . Abnormal uterine bleeding 05/15/2014  . 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 CARDIAC SURGERY    . TUBAL LIGATION      Prior to Admission medications   Medication Sig Start Date End Date Taking? Authorizing Provider  amLODipine (NORVASC) 5 MG tablet Take 5 mg by mouth daily.    [provider]  aspirin EC 81 MG tablet Take by mouth. Reported on 08/13/2015    [provider]  ciprofloxacin (CIPRO) 500 MG tablet Take 1 tablet (500 mg total) by mouth 2 (two) times daily. 04/22/17 05/02/17  Nena Polio, MD  esomeprazole (NEXIUM) 20 MG capsule Take 20 mg by mouth daily at 12 noon.    [provider]  fluconazole (DIFLUCAN) 150 MG tablet Take 1 tablet (150 mg total) by mouth once. 08/19/15   Ardis Hughs, MD  lisinopril (PRINIVIL,ZESTRIL) 5 MG tablet Take by mouth. Reported on 08/13/2015    [provider]  lovastatin (MEVACOR) 10 MG tablet Take 1 tablet by mouth daily. 07/11/15   [provider]  Melatonin 3 MG TABS Take by mouth. Reported on 08/13/2015    [provider]  Multiple Vitamin (MULTI-VITAMINS) TABS Take by mouth. Reported on 08/13/2015    [provider]  naproxen (NAPROSYN) 500 MG tablet Take 1 tablet (500 mg total) by mouth 2 (two) times daily with a meal. 08/07/15   Carrie Mew, MD  nitrofurantoin, macrocrystal-monohydrate, (MACROBID) 100 MG capsule Take 1 capsule by mouth 2 (two) times daily. Reported on 08/19/2015 07/31/15   [provider]  ondansetron (ZOFRAN ODT) 8 MG disintegrating tablet Take 1 tablet (8 mg total) by mouth every 8 (eight) hours as needed for nausea or vomiting. Patient not taking: Reported on 08/19/2015 08/13/15   Zara Council A, PA-C  phenazopyridine (PYRIDIUM) 200 MG tablet Take 1 tablet (200 mg total) by mouth 3 (three) times daily as  needed for pain. Patient not taking: Reported on 08/13/2015 08/07/15   Carrie Mew, MD  phenazopyridine (PYRIDIUM) 200 MG tablet Take 1 tablet (200 mg total) by mouth 3 (three) times daily as needed for pain. 04/22/17 04/22/18  Nena Polio, MD  tamsulosin (FLOMAX) 0.4 MG CAPS capsule Take 1 capsule (0.4 mg total) by mouth daily. 08/07/15   Carrie Mew, MD    Allergies Codeine  Family History  Problem Relation Age of Onset  . Diabetes Mother   . Heart failure Mother   . Hematuria Neg Hx     Social  History Social History  Substance Use Topics  . Smoking status: Current Every Day Smoker    Packs/day: 0.50    Types: Cigarettes  . Smokeless tobacco: Never Used  . Alcohol use No    Review of Systems  Constitutional: No fever/chills Eyes: No visual changes. ENT: No sore throat. Cardiovascular: Denies chest pain. Respiratory: Denies shortness of breath. Gastrointestinal:see history of present illness. Genitourinary:see history of present illness. Musculoskeletal: see history of present illness. Skin: Negative for rash. Neurological: Negative for headaches, focal weakness   ____________________________________________   PHYSICAL EXAM:  VITAL SIGNS: ED Triage Vitals  Enc Vitals Group     BP 04/22/17 1704 107/69     Pulse Rate 04/22/17 1704 82     Resp 04/22/17 1704 18     Temp 04/22/17 1704 98.6 F (37 C)     Temp Source 04/22/17 1704 Oral     SpO2 04/22/17 1704 98 %     Weight 04/22/17 1704 160 lb (72.6 kg)     Height 04/22/17 1704 5\' 1"  (1.549 m)     Head Circumference --      Peak Flow --      Pain Score 04/22/17 1703 10     Pain Loc --      Pain Edu? --      Excl. in [place]? --     Constitutional: Alert and oriented. Well appearing and in no acute distress. Eyes: Conjunctivae are normal.  Head: Atraumatic. Nose: No congestion/rhinnorhea. Mouth/Throat: Mucous membranes are moist.  Oropharynx non-erythematous. Neck: No stridor.  Cardiovascular: Normal rate, regular rhythm. Grossly normal heart sounds.  Good peripheral circulation. Respiratory: Normal respiratory effort.  No retractions. Lungs CTAB. Gastrointestinal: Soft some suprapubic and left of the suprapubic area pain on palpation not severe No distention. No abdominal bruits. No CVA tenderness. Musculoskeletal: No lower extremity tenderness nor edema.  No joint effusions. Neurologic:  Normal speech and language. No gross focal neurologic deficits are appreciated. No gait instability. Skin:  Skin is warm,  dry and intact. No rash noted. Psychiatric: Mood and affect are normal. Speech and behavior are normal.  ____________________________________________   LABS (all labs ordered are listed, but only abnormal results are displayed)  Labs Reviewed  COMPREHENSIVE METABOLIC PANEL - Abnormal; Notable for the following:       Result Value   Glucose, Bld 106 (*)    All other components within normal limits  URINALYSIS, COMPLETE (UACMP) WITH MICROSCOPIC - Abnormal; Notable for the following:    Color, Urine YELLOW (*)    APPearance HAZY (*)    Hgb urine dipstick MODERATE (*)    Bacteria, UA RARE (*)    Squamous Epithelial / LPF 0-5 (*)    All other components within normal limits  LIPASE, BLOOD  CBC  PREGNANCY, URINE   ____________________________________________  EKG   ____________________________________________  RADIOLOGY  IMPRESSION: Negative.   Electronically Signed  By: Kerby Moors M.D.   On: 04/22/2017 19:13  ____________________________________________   PROCEDURES  Procedure(s) performed:   Procedures  Critical Care performed:   ____________________________________________   INITIAL IMPRESSION / ASSESSMENT AND PLAN / ED COURSE  Pertinent labs & imaging results that were available during my care of the patient were reviewed by me and considered in my medical decision making (see chart for details).  Patient looks well on discharge and has looked well the whole time ss been here.      ____________________________________________   FINAL CLINICAL IMPRESSION(S) / ED DIAGNOSES  Final diagnoses:  Abdominal pain, unspecified abdominal location  Hematuria, unspecified type  Nausea vomiting and diarrhea      NEW MEDICATIONS STARTED DURING THIS VISIT:  New Prescriptions   CIPROFLOXACIN (CIPRO) 500 MG TABLET    Take 1 tablet (500 mg total) by mouth 2 (two) times daily.   PHENAZOPYRIDINE (PYRIDIUM) 200 MG TABLET    Take 1 tablet (200 mg  total) by mouth 3 (three) times daily as needed for pain.     Note:  This document was prepared using Dragon voice recognition software and may include unintentional dictation errors.    Nena Polio, MD 04/22/17 1924    Nena Polio, MD 04/22/17 [phone]

## 2017-05-05 ENCOUNTER — Emergency Department
Admission: EM | Admit: 2017-05-05 | Discharge: 2017-05-05 | Disposition: A | Discharge status: Home / Self Care | Payer: Self-pay | Attending: Emergency Medicine | Admitting: Emergency Medicine

## 2017-05-05 ENCOUNTER — Encounter: Payer: Self-pay | Admitting: Emergency Medicine

## 2017-05-05 DIAGNOSIS — R300 Dysuria: Secondary | ICD-10-CM

## 2017-05-05 DIAGNOSIS — N760 Acute vaginitis: Secondary | ICD-10-CM

## 2017-05-05 DIAGNOSIS — N898 Other specified noninflammatory disorders of vagina: Secondary | ICD-10-CM

## 2017-05-05 DIAGNOSIS — F17210 Nicotine dependence, cigarettes, uncomplicated: Secondary | ICD-10-CM | POA: Insufficient documentation

## 2017-05-05 DIAGNOSIS — I10 Essential (primary) hypertension: Secondary | ICD-10-CM | POA: Insufficient documentation

## 2017-05-05 LAB — URINALYSIS, COMPLETE (UACMP) WITH MICROSCOPIC
BACTERIA UA: NONE SEEN
BILIRUBIN URINE: NEGATIVE
GLUCOSE, UA: NEGATIVE mg/dL
Ketones, ur: NEGATIVE mg/dL
LEUKOCYTES UA: NEGATIVE
NITRITE: NEGATIVE
PROTEIN: NEGATIVE mg/dL
Specific Gravity, Urine: 1.006 (ref 1.005–1.030)
pH: 6 (ref 5.0–8.0)

## 2017-05-05 LAB — GLUCOSE, CAPILLARY: GLUCOSE-CAPILLARY: 97 mg/dL (ref 65–99)

## 2017-05-05 LAB — WET PREP, GENITAL
Clue Cells Wet Prep HPF POC: NONE SEEN
SPERM: NONE SEEN
Trich, Wet Prep: NONE SEEN
YEAST WET PREP: NONE SEEN

## 2017-05-05 LAB — CHLAMYDIA/NGC RT PCR (ARMC ONLY)
Chlamydia Tr: NOT DETECTED
N gonorrhoeae: NOT DETECTED

## 2017-05-05 LAB — PREGNANCY, URINE: PREG TEST UR: NEGATIVE

## 2017-05-05 MED ORDER — TERCONAZOLE 0.4 % VA CREA
1.0000 | TOPICAL_CREAM | Freq: Every day | VAGINAL | 0 refills | Status: AC
Start: 2017-05-05 — End: 2017-05-12

## 2017-05-05 MED ORDER — LIDOCAINE HCL 2 % EX GEL
1.0000 "application " | Freq: Once | CUTANEOUS | Status: AC
  Administered 2017-05-05: 08:00:00

## 2017-05-05 MED ORDER — LIDOCAINE HCL 2 % EX GEL
CUTANEOUS | Status: AC
  Filled 2017-05-05: qty 10

## 2017-05-05 MED ORDER — VITAMINS A & D EX OINT: 1.0000 "application " | TOPICAL_OINTMENT | CUTANEOUS | 0 refills | Status: DC | PRN

## 2017-05-05 MED ORDER — KETOROLAC TROMETHAMINE 60 MG/2ML IM SOLN
60.0000 mg | Freq: Once | INTRAMUSCULAR | Status: AC
  Administered 2017-05-05: 60 mg via INTRAMUSCULAR
  Filled 2017-05-05: qty 2

## 2017-05-05 NOTE — Discharge Instructions (Signed)
Please make an appointment to establish a primary care physician who can help manage your recurrent urinary tract infections and vaginal infections. You will also need to see a gynecologist for further evaluation of your gynecologic infections.  Please have your doctors follow up the results of your gonorrhea and chlamydia testing.  Please do not scratch or itch your vulvar area, as this will make your irritation worse. You may use the creams prescribed to decrease your symptoms. Please keep your vulva clean and dry.  Return to the emergency department if you develop severe pain, fever, nausea or vomiting, or any other symptoms concerning to you.

## 2017-05-05 NOTE — ED Triage Notes (Addendum)
Pt reports she was seen in ED 3 weeks ago, diagnosed with UTI and sent home with Levaquin and diflucan for yeast infection. Pt reports she started to show signs of yeast infection 4/10 days into antibiotics and when she finished antibiotics on Sunday she took Diflucan. Pt to ED today due to painful irritation to the vagina that has not been relieved by OTC medications. Pt denies discharge but when this RN went to restroom with pt, pt crying and grimacing when urinating due to burning.

## 2017-05-05 NOTE — ED Notes (Signed)
Pt given ice pack in hopes of that easing some burning pain to vaginal area; pt understands MD will be in as soon as available; visitor x 1 at bedside

## 2017-05-05 NOTE — ED Provider Notes (Signed)
[facility] Emergency Department Provider Note  ____________________________________________  Time seen: Approximately 7:32 AM  I have reviewed the triage vital signs and the nursing notes.   HISTORY  Chief Complaint Vaginitis    HPI Nicole Horton is a 46 y.o. female with a history of recurrent candidal vaginitis and UTI presenting with vaginal itching and pain, dysuria. The patient reports that 11 days ago, she was treated for pyelonephritis with a 10 day course of Levaquin. Approximately 4 days into her antibiotic course, she developed cottage cheese like vaginal discharge with labial irritation. On the 10th day, she took a dose of Diflucan" but it was too late." She has tried "multiple tubes" of Vagistat so, Tylenol and Motrin, and is unable to control her symptoms. She is not had any systemic illness including fever, chills, nausea or vmiting. She denies diabetes, HIV, or other immunocompromised states. She reports 1 monogamous partner. One month ago, the patient underwent a full course of treatment with Flagyl for bacterial vaginosis.She does not have a PCP or gynecologist.   Past Medical History:  Diagnosis Date  . Abnormal uterine bleeding 05/15/2014   Overview:  [place]: shows 1. 97 cm, Lt cyst .88   . Adnexal pain 08/13/2015  . Essential (primary) hypertension 07/31/2015  . GERD (gastroesophageal reflux disease)   . Hyperlipidemia   . 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 Nephrolithiasis 08/14/2015  . Gross hematuria 08/14/2015  . Adnexal pain 08/13/2015  . Essential (primary) hypertension 07/31/2015  . Abnormal uterine bleeding 05/15/2014  . 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 CARDIAC SURGERY    . TUBAL LIGATION      Current Outpatient Rx  . Order #: [id number] Class: Historical Med  .  Order #: [id number] Class: Historical Med  . Order #: [id number] Class: Historical Med  . Order #: [id number] Class: Normal  . Order #: [id number] Class: Historical Med  . Order #: [id number] Class: Historical Med  . Order #: [id number] Class: Historical Med  . Order #: [id number] Class: Historical Med  . Order #: [id number] Class: Print  . Order #: [id number] Class: Historical Med  . Order #: [id number] Class: Print  . Order #: [id number] Class: Print  . Order #: [id number] Class: Print  . Order #: [id number] Class: Print  . Order #: [id number] Class: Print  . Order #: [id number] Class: Print    Allergies Codeine  Family History  Problem Relation Age of Onset  . Diabetes Mother   . Heart failure Mother   . Hematuria Neg Hx     Social History Social History  Substance Use Topics  . Smoking status: Current Every Day Smoker    Packs/day: 0.50    Types: Cigarettes  . Smokeless tobacco: Never Used  . Alcohol use No    Review of Systems Constitutional: No fever/chills.no lightheadedness or syncope. Eyes: No visual changes. ENT: No sore throat. No congestion or rhinorrhea. Cardiovascular: Denies chest pain. Denies palpitations. Respiratory: Denies shortness of breath.  No cough. Gastrointestinal: No abdominal pain.  No nausea, no vomiting.  No diarrhea.  No constipation. Genitourinary: Positivefor dysuria.positive for severe burning, erythema and irritationin the labia and volar areas. Positive for thick cottage cheese like vaginal discharge. Musculoskeletal: Negative for back pain. Skin: Negative for rash. Neurological: Negative for headaches. No focal numbness, tingling or weakness.     ____________________________________________   PHYSICAL EXAM:  VITAL SIGNS:  ED Triage Vitals  Enc Vitals Group     BP 05/05/17 0445 130/69     Pulse Rate 05/05/17 0445 66     Resp 05/05/17 0445 17     Temp 05/05/17 0445 97.8 F (36.6 C)     Temp src --      SpO2 05/05/17 0445 100 %     Weight 05/05/17  0435 160 lb (72.6 kg)     Height --      Head Circumference --      Peak Flow --      Pain Score 05/05/17 0641 10     Pain Loc --      Pain Edu? --      Excl. in [place]? --     Constitutional: Alert and oriented. uncomfortableappearing but in no acute distress. Answers questions appropriately. Eyes: Conjunctivae are normal.  EOMI. No scleral icterus. Head: Atraumatic. Nose: No congestion/rhinnorhea. Mouth/Throat: Mucous membranes are moist.  Neck: No stridor.  Supple.   Cardiovascular: Normal rate,   Respiratory: Normal respiratory effort.   Gastrointestinal: Soft, nontender and nondistended.  No guarding or rebound.  No peritoneal signs. Genitourinary:External genitalia without lesions; + erythema and ttp over the inner labia and proximal outer labia. Vaginal exam with mild yellow white discharge, normal-appearing cervix, normal vaginal wall tissue. Bimanual exam is negative for CMT, adnexal tenderness to palpation, no palpable masses. Musculoskeletal: No LE edema.  Neurologic:  A&Ox3.  Speech is clear.  Face and smile are symmetric.  EOMI.  Moves all extremities well. Skin:  Skin is warm, dry and intact. No rash noted. Psychiatric: Mood and affect are normal. Speech and behavior are normal.  Normal judgement  ____________________________________________   LABS (all labs ordered are listed, but only abnormal results are displayed)  Labs Reviewed  WET PREP, GENITAL - Abnormal; Notable for the following:       Result Value   WBC, Wet Prep HPF POC FEW (*)    All other components within normal limits  URINALYSIS, COMPLETE (UACMP) WITH MICROSCOPIC - Abnormal; Notable for the following:    Color, Urine YELLOW (*)    APPearance HAZY (*)    Hgb urine dipstick SMALL (*)    Squamous Epithelial / LPF 0-5 (*)    All other components within normal limits  CHLAMYDIA/NGC RT PCR ([facility] ONLY)  PREGNANCY, URINE  GLUCOSE, CAPILLARY  CBG MONITORING, ED    ____________________________________________  EKG  Not indicated ____________________________________________  RADIOLOGY  No results found.  ____________________________________________   PROCEDURES  Procedure(s) performed: None  Procedures  Critical Care performed: No ____________________________________________   INITIAL IMPRESSION / ASSESSMENT AND PLAN / ED COURSE  Pertinent labs & imaging results that were available during my care of the patient were reviewed by me and considered in my medical decision making (see chart for details).  46 y.o. Female with a history of recurrent UTI and vaginitis presenting witherythema and pain with itching to the external vagina with associated change in vaginal discharge after her course of antibiotics. The patient here is not having any hemodynamic instability, she is afebrile, and she has not had any systemic illness. On my examination, she does have clear evidence of vaginitis and I have treated her with lidocaine jelly to improve her pain. We have performed cultures and wet prep on her discharge and will await the results. In the meantime I'll also treat her with Toradol to decrease swelling and pain. She's been given an ice pack for her perineum. Plan reevaluation  for final disposition.  I've encouraged the patient to establish a primary care physician. She denies any history of diabetes, but I am concerned about an underlying immunocompromise state that may contributing to her recurrent infections. She will be discharged with a referral for a PMD as well as a gynecologist for close outpatient follow-up.  ----------------------------------------- 9:06 AM on 05/05/2017 -----------------------------------------  The patient's workup in the emergency department has been reassuring. Her wet prep does not show any infection although there are few white blood cells. Her glucose is normal. Her urine does not show infection. She is feeling  better afterlidocaine jelly application. I'll plan to discharge her home with a barrier cream and Terazol, and have her follow-up with both gynecology and establish a primary care physician.Precautions as well as follow-up instructions were discussed.  ____________________________________________  FINAL CLINICAL IMPRESSION(S) / ED DIAGNOSES  Final diagnoses:  Acute vaginitis  Dysuria  Vaginal discharge         NEW MEDICATIONS STARTED DURING THIS VISIT:  New Prescriptions   TERCONAZOLE (TERAZOL 7) 0.4 % VAGINAL CREAM    Place 1 applicator vaginally at bedtime.   VITAMINS A & D (VITAMIN A & D) OINTMENT    Apply 1 application topically as needed for dry skin.      Eula Listen, MD 05/05/17 [phone]

## 2017-06-13 DIAGNOSIS — G2581 Restless legs syndrome: Secondary | ICD-10-CM | POA: Insufficient documentation

## 2017-06-13 DIAGNOSIS — N390 Urinary tract infection, site not specified: Secondary | ICD-10-CM | POA: Insufficient documentation

## 2017-06-13 DIAGNOSIS — K219 Gastro-esophageal reflux disease without esophagitis: Secondary | ICD-10-CM | POA: Insufficient documentation

## 2017-07-07 ENCOUNTER — Ambulatory Visit (INDEPENDENT_AMBULATORY_CARE_PROVIDER_SITE_OTHER): Payer: 59 | Admitting: Obstetrics & Gynecology

## 2017-07-07 ENCOUNTER — Encounter: Payer: Self-pay | Admitting: Obstetrics & Gynecology

## 2017-07-07 VITALS — BP 120/80 | HR 72 | Ht 61.0 in | Wt 179.0 lb

## 2017-07-07 DIAGNOSIS — Z1273 Encounter for screening for malignant neoplasm of ovary: Secondary | ICD-10-CM

## 2017-07-07 DIAGNOSIS — Z124 Encounter for screening for malignant neoplasm of cervix: Secondary | ICD-10-CM | POA: Diagnosis not present

## 2017-07-07 DIAGNOSIS — Z1231 Encounter for screening mammogram for malignant neoplasm of breast: Secondary | ICD-10-CM | POA: Diagnosis not present

## 2017-07-07 DIAGNOSIS — N921 Excessive and frequent menstruation with irregular cycle: Secondary | ICD-10-CM | POA: Diagnosis not present

## 2017-07-07 DIAGNOSIS — Z0000 Encounter for general adult medical examination without abnormal findings: Secondary | ICD-10-CM | POA: Diagnosis not present

## 2017-07-07 DIAGNOSIS — Z1239 Encounter for other screening for malignant neoplasm of breast: Secondary | ICD-10-CM

## 2017-07-07 NOTE — Progress Notes (Signed)
HPI:      [name] is a 46 y.o. [phone] who LMP was No LMP recorded. Patient is perimenopausal., she presents today for her annual examination AS WELL AS concerns over irreg spaced but heavy painful periods w clots and missing work with no modifiers to help or change that fact. Prior US and EMB done at [facility] (neg).     The patient is sexually active. Her last pap: approximate date 2015 and was normal and last mammogram: approximate date 2014 and was normal. The patient does perform self breast exams.  There is notable family history of breast (MOTHER 50s) and ovarian (MGM 60s) cancer in her family, also colon on maternal side.  The patient has regular exercise: yes.  The patient denies current symptoms of depression.    GYN History: Contraception: tubal ligation  PMHx: Past Medical History:  Diagnosis Date  . Abnormal uterine bleeding 05/15/2014   Overview:  [place]: shows 1. 97 cm, Lt cyst .88   . Adnexal pain 08/13/2015  . Essential (primary) hypertension 07/31/2015  . GERD (gastroesophageal reflux disease)   . Hyperlipidemia   . 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 CARDIAC SURGERY    . TUBAL LIGATION     Family History  Problem Relation Age of Onset  . Diabetes Mother   . Heart failure Mother   . Breast cancer Mother   . Brain cancer Maternal Aunt   . Ovarian cancer Maternal Grandmother   . Rectal cancer Maternal Grandfather   . Hematuria Neg Hx    Social History   Tobacco Use  . Smoking status: Current Every Day Smoker    Packs/day: 0.50    Types: Cigarettes  . Smokeless tobacco: Never Used  Substance Use Topics  . Alcohol use: No  . Drug use: No    Current Outpatient Medications:  .  amLODipine (NORVASC) 5 MG tablet, Take 5 mg by mouth daily., Disp: , Rfl:  .  aspirin EC 81 MG tablet, Take by mouth. Reported on 08/13/2015, Disp: , Rfl:  .  esomeprazole  (NEXIUM) 20 MG capsule, Take 20 mg by mouth daily at 12 noon., Disp: , Rfl:  .  fluconazole (DIFLUCAN) 150 MG tablet, Take 1 tablet (150 mg total) by mouth once., Disp: 1 tablet, Rfl: 0 .  lisinopril (PRINIVIL,ZESTRIL) 5 MG tablet, Take by mouth. Reported on 08/13/2015, Disp: , Rfl:  .  lovastatin (MEVACOR) 10 MG tablet, Take 1 tablet by mouth daily., Disp: , Rfl: 2 .  Melatonin 3 MG TABS, Take by mouth. Reported on 08/13/2015, Disp: , Rfl:  .  Multiple Vitamin (MULTI-VITAMINS) TABS, Take by mouth. Reported on 08/13/2015, Disp: , Rfl:  .  naproxen (NAPROSYN) 500 MG tablet, Take 1 tablet (500 mg total) by mouth 2 (two) times daily with a meal., Disp: 20 tablet, Rfl: 0 .  nitrofurantoin, macrocrystal-monohydrate, (MACROBID) 100 MG capsule, Take 1 capsule by mouth 2 (two) times daily. Reported on 08/19/2015, Disp: , Rfl: 0 .  ondansetron (ZOFRAN ODT) 8 MG disintegrating tablet, Take 1 tablet (8 mg total) by mouth every 8 (eight) hours as needed for nausea or vomiting. (Patient not taking: Reported on 08/19/2015), Disp: 20 tablet, Rfl: 0 .  phenazopyridine (PYRIDIUM) 200 MG tablet, Take 1 tablet (200 mg total) by mouth 3 (three) times daily as needed for pain. (Patient not taking: Reported on 08/13/2015), Disp: 10  tablet, Rfl: 0 .  phenazopyridine (PYRIDIUM) 200 MG tablet, Take 1 tablet (200 mg total) by mouth 3 (three) times daily as needed for pain., Disp: 9 tablet, Rfl: 0 .  tamsulosin (FLOMAX) 0.4 MG CAPS capsule, Take 1 capsule (0.4 mg total) by mouth daily., Disp: 30 capsule, Rfl: 0 .  Vitamins A & D (VITAMIN A & D) ointment, Apply 1 application topically as needed for dry skin., Disp: 45 g, Rfl: 0 Allergies: Codeine  Review of Systems  Constitutional: Negative for chills, fever and malaise/fatigue.  HENT: Negative for congestion, sinus pain and sore throat.   Eyes: Negative for blurred vision and pain.  Respiratory: Negative for cough and wheezing.   Cardiovascular: Negative for chest pain and leg  swelling.  Gastrointestinal: Negative for abdominal pain, constipation, diarrhea, heartburn, nausea and vomiting.  Genitourinary: Negative for dysuria, frequency, hematuria and urgency.  Musculoskeletal: Negative for back pain, joint pain, myalgias and neck pain.  Skin: Negative for itching and rash.  Neurological: Negative for dizziness, tremors and weakness.  Endo/Heme/Allergies: Does not bruise/bleed easily.  Psychiatric/Behavioral: Negative for depression. The patient is not nervous/anxious and does not have insomnia.    Objective: BP 120/80   Pulse 72   Ht 5' 1"  (1.549 m)   Wt 179 lb (81.2 kg)   BMI 33.82 kg/m   Filed Weights   07/07/17 1327  Weight: 179 lb (81.2 kg)   Body mass index is 33.82 kg/m. Physical Exam  Constitutional: She is oriented to person, place, and time. She appears well-developed and well-nourished. No distress.  Genitourinary: Rectum normal, vagina normal and uterus normal. Pelvic exam was performed with patient supine. There is no rash or lesion on the right labia. There is no rash or lesion on the left labia. Vagina exhibits no lesion. No bleeding in the vagina. Right adnexum does not display mass and does not display tenderness. Left adnexum does not display mass and does not display tenderness. Cervix does not exhibit motion tenderness, lesion, friability or polyp.   Uterus is mobile and midaxial. Uterus is not enlarged or exhibiting a mass.  HENT:  Head: Normocephalic and atraumatic. Head is without laceration.  Right Ear: Hearing normal.  Left Ear: Hearing normal.  Nose: No epistaxis.  No foreign bodies.  Mouth/Throat: Uvula is midline, oropharynx is clear and moist and mucous membranes are normal.  Eyes: Pupils are equal, round, and reactive to light.  Neck: Normal range of motion. Neck supple. No thyromegaly present.  Cardiovascular: Normal rate and regular rhythm. Exam reveals no gallop and no friction rub.  No murmur heard. Pulmonary/Chest:  Effort normal and breath sounds normal. No respiratory distress. She has no wheezes. Right breast exhibits no mass, no skin change and no tenderness. Left breast exhibits no mass, no skin change and no tenderness.  Abdominal: Soft. Bowel sounds are normal. She exhibits no distension. There is no tenderness. There is no rebound.  Musculoskeletal: Normal range of motion.  Neurological: She is alert and oriented to person, place, and time. No cranial nerve deficit.  Skin: Skin is warm and dry.  Psychiatric: She has a normal mood and affect. Judgment normal.  Vitals reviewed.  Assessment:  ANNUAL EXAM 1. Menometrorrhagia   2. Annual physical exam   3. Screening for cervical cancer   4. Screening for breast cancer   5. Screening for ovarian cancer    Screening Plan:            1.  Cervical Screening-  Pap smear  done today  2. Breast screening- Exam annually and mammogram>40 planned   3. Colonoscopy every 10 years, Hemoccult testing - after age 23  4. Labs managed by PCP  5. Counseling for contraception: bilateral tubal ligation  Other:  1. Menometrorrhagia Patient has abnormal uterine bleeding . She has a normal exam today, with no evidence of lesions.  Evaluation includes the following: exam, labs such as hormonal testing, and pelvic ultrasound to evaluate for any structural gynecologic abnormalities.  Patient to follow up after testing.  Treatment option for menorrhagia or menometrorrhagia discussed in great detail with the patient.  Options include hormonal therapy, IUD therapy such as Mirena, D&C, Ablation, and Hysterectomy.  The pros and cons of each option discussed with patient.  Pt desires hysterectomy.  Possible BSO based on BRCA results.  Pros and cons of surgery discussed.  Sch in January.  2. Annual physical exam  3. Screening for cervical cancer - IGP, Aptima HPV  4. Screening for breast cancer - MM DIGITAL SCREENING BILATERAL; Future - [facility] Breast [facility]  Cancer  5. Screening for ovarian cancer - [facility] Breast and GYN Cancer    F/U  Return in about 1 year (around 07/07/2018) for Annual.  Also Izora Gala to call about preop.Barnett Applebaum, MD, [facility], [facility] 07/07/2017  2:01 PM

## 2017-07-07 NOTE — Patient Instructions (Signed)
Total Laparoscopic Hysterectomy °A total laparoscopic hysterectomy is a minimally invasive surgery to remove your uterus and cervix. This surgery is performed by making several small cuts (incisions) in your abdomen. It can also be done with a thin, lighted tube (laparoscope) inserted into two small incisions in your lower abdomen. Your fallopian tubes and ovaries can be removed (bilateral salpingo-oophorectomy) during this surgery as well. Benefits of minimally invasive surgery include: °· Less pain. °· Less risk of blood loss. °· Less risk of infection. °· Quicker return to normal activities. ° °Tell a health care provider about: °· Any allergies you have. °· All medicines you are taking, including vitamins, herbs, eye drops, creams, and over-the-counter medicines. °· Any problems you or family members have had with anesthetic medicines. °· Any blood disorders you have. °· Any surgeries you have had. °· Any medical conditions you have. °What are the risks? °Generally, this is a safe procedure. However, as with any procedure, complications can occur. Possible complications include: °· Bleeding. °· Blood clots in the legs or lung. °· Infection. °· Injury to surrounding organs. °· Problems with anesthesia. °· Early menopause symptoms (hot flashes, night sweats, insomnia). °· Risk of conversion to an open abdominal incision. ° °What happens before the procedure? °· Ask your health care provider about changing or stopping your regular medicines. °· Do not take aspirin or blood thinners (anticoagulants) for 1 week before the surgery or as told by your health care provider. °· Do not eat or drink anything for 8 hours before the surgery or as told by your health care provider. °· Quit smoking if you smoke. °· Arrange for a ride home after surgery and for someone to help you at home during recovery. °What happens during the procedure? °· You will be given antibiotic medicine. °· An IV tube will be placed in your arm. You  will be given medicine to make you sleep (general anesthetic). °· A gas (carbon dioxide) will be used to inflate your abdomen. This will allow your surgeon to look inside your abdomen, perform your surgery, and treat any other problems found if necessary. °· Three or four small incisions (often less than 1/2 inch) will be made in your abdomen. One of these incisions will be made in the area of your belly button (navel). The laparoscope will be inserted into the incision. Your surgeon will look through the laparoscope while doing your procedure. °· Other surgical instruments will be inserted through the other incisions. °· Your uterus may be removed through your vagina or cut into small pieces and removed through the small incisions. °· Your incisions will be closed. °What happens after the procedure? °· The gas will be released from inside your abdomen. °· You will be taken to the recovery area where a nurse will watch and check your progress. Once you are awake, stable, and taking fluids well, without other problems, you will return to your room or be allowed to go home. °· There is usually minimal discomfort following the surgery because the incisions are so small. °· You will be given pain medicine while you are in the hospital and for when you go home. °This information is not intended to replace advice given to you by your health care provider. Make sure you discuss any questions you have with your health care provider. °Document Released: 05/23/2007 Document Revised: 01/01/2016 Document Reviewed: 02/13/2013 °Elsevier Interactive Patient Education © 2017 Else[employer] Inc. ° °

## 2017-07-08 ENCOUNTER — Telehealth: Payer: Self-pay | Admitting: Obstetrics & Gynecology

## 2017-07-08 NOTE — Telephone Encounter (Signed)
-----   Message from Gae Dry, MD sent at 07/07/2017  2:08 PM EST ----- Regarding: surg Surgery Booking Request Patient Full Name:   MRN: [id number]  DOB: 03-21-1971  Surgeon: Hoyt Koch, MD  Requested Surgery Date and Time: 08/11/17 Primary Diagnosis AND Code: Menometrorrhagia, Pelvic pain Secondary Diagnosis and Code:  Surgical Procedure: TLH/BS L&D Notification: No Admission Status: same day surgery Length of Surgery: 1 Special Case Needs: no H&P: yes (date) Phone Interview???: yes Interpreter: Language:  Medical Clearance: no Special Scheduling Instructions: no

## 2017-07-08 NOTE — Telephone Encounter (Signed)
Patient is aware of H&P at [facility] on 08/03/17 @ 4:10pm w/ Dr. Kenton Kingfisher, Pre-admit Testing phone interview to be scheduled, and OR on 08/11/17. Patient is aware she may receive phone calls from the [place].

## 2017-07-09 DIAGNOSIS — J019 Acute sinusitis, unspecified: Secondary | ICD-10-CM

## 2017-07-09 HISTORY — DX: Acute sinusitis, unspecified: J01.90

## 2017-07-09 LAB — IGP, APTIMA HPV
HPV APTIMA: NEGATIVE
PAP Smear Comment: 0

## 2017-07-21 ENCOUNTER — Telehealth: Payer: Self-pay

## 2017-07-21 NOTE — Telephone Encounter (Signed)
FMLA/DISABILITY form for [employer] filled out and given to [place] for processing.

## 2017-07-28 ENCOUNTER — Telehealth: Payer: Self-pay | Admitting: Obstetrics & Gynecology

## 2017-07-28 NOTE — Telephone Encounter (Signed)
Patient called back w/ [employer] member ID [id number] and group# [id number]. I spoke to [facility], policy will be effective 08/09/17, $1500 ded, 80/20 coinsurance, $6000 oop, no prior auth required for [address], Ref# [id number].  I contacted the patient to give her the info from [facility]. Patient is aware to also expect calls from the Pharmacy and Pre-service center, and that she may have to give the [place] again when the [facility] calls, as the policy is not effective until 08/09/17. Patient is also aware of Pre-admit Testing phone interview on 08/04/17.

## 2017-07-28 NOTE — Telephone Encounter (Signed)
FMLA paperwork faxed through to [employer].

## 2017-07-28 NOTE — Telephone Encounter (Signed)
I contacted Delsa Sale @ [employer] who said the patient's Nicole Horton is scheduled to be terminated on 08/08/17. I spoke to the patient who said she will have [employer] effective 08/09/17. The patient will call back with the subscriber or member ID so that authorization for surgery can be obtained. Patient is aware I am out of the office next week and  Patient has my phone# and ext.

## 2017-08-03 ENCOUNTER — Encounter: Payer: Self-pay | Admitting: Obstetrics & Gynecology

## 2017-08-03 ENCOUNTER — Ambulatory Visit (INDEPENDENT_AMBULATORY_CARE_PROVIDER_SITE_OTHER): Payer: 59 | Admitting: Obstetrics & Gynecology

## 2017-08-03 VITALS — BP 120/80 | HR 67 | Ht 61.5 in | Wt 180.0 lb

## 2017-08-03 DIAGNOSIS — N921 Excessive and frequent menstruation with irregular cycle: Secondary | ICD-10-CM | POA: Diagnosis not present

## 2017-08-03 NOTE — Patient Instructions (Signed)

## 2017-08-03 NOTE — Progress Notes (Signed)
PRE-OPERATIVE HISTORY AND PHYSICAL EXAM  HPI:  Nicole Horton is a 46 y.o. [id number] No LMP recorded. Patient is perimenopausal.; she is being admitted for surgery related to abnormal uterine bleeding.  She hasirreg spaced but heavy painful periods w clots and missing work with no modifiers to help or change that fact. Prior US and EMB done at [facility] (neg).  PMHx: Past Medical History:  Diagnosis Date  . Abnormal uterine bleeding 05/15/2014   Overview:  [place]: shows 1. 97 cm, Lt cyst .88   . Adnexal pain 08/13/2015  . Essential (primary) hypertension 07/31/2015  . GERD (gastroesophageal reflux disease)   . Hyperlipidemia   . 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 CARDIAC SURGERY    . TUBAL LIGATION     Family History  Problem Relation Age of Onset  . Diabetes Mother   . Heart failure Mother   . Breast cancer Mother   . Brain cancer Maternal Aunt   . Ovarian cancer Maternal Grandmother   . Rectal cancer Maternal Grandfather   . Hematuria Neg Hx    Social History   Tobacco Use  . Smoking status: Current Every Day Smoker    Packs/day: 0.50    Types: Cigarettes  . Smokeless tobacco: Never Used  Substance Use Topics  . Alcohol use: No  . Drug use: No    Current Outpatient Medications:  .  acetaminophen (TYLENOL) 325 MG tablet, Take 650 mg by mouth every 6 (six) hours as needed for moderate pain or headache., Disp: , Rfl:  .  aspirin EC 81 MG tablet, Take 81 mg by mouth daily. , Disp: , Rfl:  .  ibuprofen (ADVIL,MOTRIN) 200 MG tablet, Take 400 mg by mouth every 6 (six) hours as needed for headache or moderate pain., Disp: , Rfl:  .  metoprolol tartrate (LOPRESSOR) 25 MG tablet, Take 12.5 mg by mouth 2 (two) times daily., Disp: , Rfl:  .  pantoprazole (PROTONIX) 40 MG tablet, Take 40 mg by mouth daily., Disp: , Rfl:  .  rOPINIRole (REQUIP) 0.25 MG tablet, Take 0.25 mg by mouth at  bedtime., Disp: , Rfl:  .  tamsulosin (FLOMAX) 0.4 MG CAPS capsule, Take 1 capsule (0.4 mg total) by mouth daily., Disp: 30 capsule, Rfl: 0 .  naproxen (NAPROSYN) 500 MG tablet, Take 1 tablet (500 mg total) by mouth 2 (two) times daily with a meal. (Patient not taking: Reported on 07/26/2017), Disp: 20 tablet, Rfl: 0 .  ondansetron (ZOFRAN ODT) 8 MG disintegrating tablet, Take 1 tablet (8 mg total) by mouth every 8 (eight) hours as needed for nausea or vomiting. (Patient not taking: Reported on 08/19/2015), Disp: 20 tablet, Rfl: 0 .  phenazopyridine (PYRIDIUM) 200 MG tablet, Take 1 tablet (200 mg total) by mouth 3 (three) times daily as needed for pain. (Patient not taking: Reported on 08/13/2015), Disp: 10 tablet, Rfl: 0 .  Vitamins A & D (VITAMIN A & D) ointment, Apply 1 application topically as needed for dry skin. (Patient not taking: Reported on 07/26/2017), Disp: 45 g, Rfl: 0 Allergies: Other and Codeine  Review of Systems  Constitutional: Negative for chills, fever and malaise/fatigue.  HENT: Negative for congestion, sinus pain and sore throat.   Eyes: Negative for blurred vision and pain.  Respiratory: Negative for cough and wheezing.   Cardiovascular: Negative for chest pain and leg swelling.  Gastrointestinal: Negative for abdominal pain, constipation, diarrhea, heartburn, nausea and vomiting.  Genitourinary: Negative for dysuria, frequency, hematuria and urgency.  Musculoskeletal: Negative for back pain, joint pain, myalgias and neck pain.  Skin: Negative for itching and rash.  Neurological: Negative for dizziness, tremors and weakness.  Endo/Heme/Allergies: Does not bruise/bleed easily.  Psychiatric/Behavioral: Negative for depression. The patient is not nervous/anxious and does not have insomnia.     Objective: BP 120/80   Pulse 67   Ht 5' 1.5" (1.562 m)   Wt 180 lb (81.6 kg)   BMI 33.46 kg/m   Filed Weights   08/03/17 1633  Weight: 180 lb (81.6 kg)   Physical Exam    Constitutional: She is oriented to person, place, and time. She appears well-developed and well-nourished. No distress.  Genitourinary: Rectum normal, vagina normal and uterus normal. Pelvic exam was performed with patient supine. There is no rash or lesion on the right labia. There is no rash or lesion on the left labia. Vagina exhibits no lesion. No bleeding in the vagina. Right adnexum does not display mass and does not display tenderness. Left adnexum does not display mass and does not display tenderness. Cervix does not exhibit motion tenderness, lesion, friability or polyp.   Uterus is mobile and midaxial. Uterus is not enlarged or exhibiting a mass.  HENT:  Head: Normocephalic and atraumatic. Head is without laceration.  Right Ear: Hearing normal.  Left Ear: Hearing normal.  Nose: No epistaxis.  No foreign bodies.  Mouth/Throat: Uvula is midline, oropharynx is clear and moist and mucous membranes are normal.  Eyes: Pupils are equal, round, and reactive to light.  Neck: Normal range of motion. Neck supple. No thyromegaly present.  Cardiovascular: Normal rate and regular rhythm. Exam reveals no gallop and no friction rub.  No murmur heard. Pulmonary/Chest: Effort normal and breath sounds normal. No respiratory distress. She has no wheezes. Right breast exhibits no mass, no skin change and no tenderness. Left breast exhibits no mass, no skin change and no tenderness.  Abdominal: Soft. Bowel sounds are normal. She exhibits no distension. There is no tenderness. There is no rebound.  Musculoskeletal: Normal range of motion.  Neurological: She is alert and oriented to person, place, and time. No cranial nerve deficit.  Skin: Skin is warm and dry.  Psychiatric: She has a normal mood and affect. Judgment normal.  Vitals reviewed.  Assessment: 1. Menometrorrhagia   Pt has been given options of ablation, medicine, IUD, but prefers hystrectomy as treatment of her symptoms. Ovarian preservation  discussed.  I have had a careful discussion with this patient about all the options available and the risk/benefits of each. I have fully informed this patient that surgery may subject her to a variety of discomforts and risks: She understands that most patients have surgery with little difficulty, but problems can happen ranging from minor to fatal. These include nausea, vomiting, pain, bleeding, infection, poor healing, hernia, or formation of adhesions. Unexpected reactions may occur from any drug or anesthetic given. Unintended injury may occur to other pelvic or abdominal structures such as Fallopian tubes, ovaries, bladder, ureter (tube from kidney to bladder), or bowel. Nerves going from the pelvis to the legs may be injured. Any such injury may require immediate or later additional surgery to correct the problem. Excessive blood loss requiring transfusion is very unlikely but possible. Dangerous blood clots may form in the legs or lungs. Physical and sexual activity will be restricted in varying degrees for an indeterminate period  of time but most often 2-6 weeks.  Finally, she understands that it is impossible to list every possible undesirable effect and that the condition for which surgery is done is not always cured or significantly improved, and in rare cases may be even worse.Ample time was given to answer all questions.  Barnett Applebaum, MD, [facility], [facility] 08/03/2017  4:36 PM

## 2017-08-04 ENCOUNTER — Encounter: Payer: Self-pay | Admitting: *Deleted

## 2017-08-04 ENCOUNTER — Other Ambulatory Visit: Payer: Self-pay

## 2017-08-04 ENCOUNTER — Encounter
Admission: RE | Admit: 2017-08-04 | Discharge: 2017-08-04 | Disposition: A | Discharge status: Home / Self Care | Payer: 59 | Source: Ambulatory Visit | Attending: Obstetrics & Gynecology | Admitting: Obstetrics & Gynecology

## 2017-08-04 HISTORY — DX: Cardiac arrhythmia, unspecified: I49.9

## 2017-08-04 HISTORY — DX: Personal history of urinary calculi: Z87.442

## 2017-08-04 HISTORY — DX: Anxiety disorder, unspecified: F41.9

## 2017-08-04 HISTORY — DX: Unspecified atrial fibrillation: I48.91

## 2017-08-04 HISTORY — DX: Malformation of coronary vessels: Q24.5

## 2017-08-04 NOTE — Pre-Procedure Instructions (Signed)
ECG 12-lead2/19/2018 [place] Component Name Value Ref Range  Vent Rate (bpm) 81   PR Interval (msec) 136   QRS Interval (msec) 76   QT Interval (msec) 380   QTc (msec) 441   Result Narrative  Normal sinus rhythm RSR' in V1 or V2 Nonspecific ST depression Nonspecific T wave abnormalities Abnormal ECG No previous ECGs available I reviewed and concur with this report. Electronically signed RJ:[email or web address], MD, CHRISTOPHER [phone]) on 09/27/2016 8:23:19 PM  Status Results Details

## 2017-08-04 NOTE — Patient Instructions (Signed)
Your procedure is scheduled on: 08-11-16 THURSDAY Report to Same Day Surgery 2nd floor medical mall [facility] Entrance-take elevator on left to 2nd floor.  Check in with surgery information desk.) To find out your arrival time please call [phone] between 1PM - 3PM on 08-10-16 [facility]  Remember: Instructions that are not followed completely may result in serious medical risk, up to and including death, or upon the discretion of your surgeon and anesthesiologist your surgery may need to be rescheduled.    _x___ 1. Do not eat food after midnight the night before your procedure. NO GUM OR CANDY AFTER MIDNIGHT.  You may drink clear liquids up to 2 hours before you are scheduled to arrive at the hospital for your procedure.  Do not drink clear liquids within 2 hours of your scheduled arrival to the hospital.  Clear liquids include  --Water or Apple juice without pulp  --Clear carbohydrate beverage such as ClearFast or Gatorade  --Black Coffee or Clear Tea (No milk, no creamers, do not add anything to the coffee or Tea     __x__ 2. No Alcohol for 24 hours before or after surgery.   __x__3. No Smoking for 24 prior to surgery.   ____  4. Bring all medications with you on the day of surgery if instructed.    __x__ 5. Notify your doctor if there is any change in your medical condition     (cold, fever, infections).     Do not wear jewelry, make-up, hairpins, clips or nail polish.  Do not wear lotions, powders, or perfumes. You may wear deodorant.  Do not shave 48 hours prior to surgery. Men may shave face and neck.  Do not bring valuables to the hospital.    [facility] is not responsible for any belongings or valuables.               Contacts, dentures or bridgework may not be worn into surgery.  Leave your suitcase in the car. After surgery it may be brought to your room.  For patients admitted to the hospital, discharge time is determined by your treatment team.   Patients  discharged the day of surgery will not be allowed to drive home.  You will need someone to drive you home and stay with you the night of your procedure.    Please read over the following fact sheets that you were given:   [facility] Preparing for Surgery and or MRSA Information   _x___ TAKE THE FOLLOWING MEDICATION THE MORNING OF SURGERY WITH A SMALL SIP OF WATER. These include:  1. METOPROLOL  2. PROTONIX  3. TAKE A PROTONIX Wednesday NIGHT BEFORE BED (08-10-16)  4.  5.  6.  ____Fleets enema or Magnesium Citrate as directed.   _x___ Use CHG Soap or sage wipes as directed on instruction sheet   ____ Use inhalers on the day of surgery and bring to hospital day of surgery  ____ Stop Metformin and Janumet 2 days prior to surgery.    ____ Take 1/2 of usual insulin dose the night before surgery and none on the morning surgery.   _x___ Follow recommendations from Cardiologist, Pulmonologist or PCP regarding stopping Aspirin, Coumadin, Plavix ,Eliquis, Effient, or Pradaxa, and Pletal-PT STOPPED HER ASA 2-3 WEEKS AGO BECAUSE SHE RAN OUT AND PCP TOLD HER SHE DID NOT NEED TO RESTART IT  X____Stop Anti-inflammatories such as Advil, Aleve, IBUPROFEN, Motrin, Naproxen, Naprosyn, Goodies powders or aspirin products NOW-OK to take Tylenol  ____ Stop supplements until after surgery.    ____ Bring C-Pap to the hospital.

## 2017-08-04 NOTE — Pre-Procedure Instructions (Signed)
CARD holter monitor up to 48 hours - scan and analysis2/19/2018 [place] Result Narrative  Recording Start Date/Time: 09/27/2016 9:42:00 AM Min HR: 53 BPM At: 09/28/2016 6:42:53 AM Max HR: 128 BPM At: 09/29/2016 7:40:28 AM Avg HR: 82 BPM Pauses > 2028ms:0 Longest RR: 1.364 at 09/28/2016 6:28:05 AM  Ventricular Beats: 3 Couplets: 0 Runs: 0 Fastest Run:BPM at  Longest Ventricular Run:BPM at  Supraventricular Beats: 17 Supraventricular Fastest Run:BPM at   Referring Physician: Cristina Gong ([id number]) Processing Location: [facility] Procedure Type: HOLTER SCAN & ANALYSIS Date Processed: 10/13/2016 Recording Duration: 47 hr, 51 min Hookup Technician: Elisabeth Pigeon, CT Analyst: Clydene Laming, CT Recorder Number:   Diagnosis: Palpitations [R00.2]  Conclusions: 1) The quality of this 48 hour Holter was good. 2) The patient was in sinus rhythm, sinus bradycardia, sinus tachycardia.3) Ventricular ectopic activity consisted of rare unifocal PVCs (strips 5, 14) (<1% of total beats). 4)  Supraventricular ectopic activity consisted of rare PACs (strips 11, 12) (<1% of total beats), an isolated atrial pair (strip 18).5) There were no pauses greater than 2.0 seconds noted. 6) Symptoms of "palpitations" and event button activations  correlated with sinus rhythm, sinus tachycardia (strips 7, 10, 16, 17). I have reviewed the ECG tracings and report, amended them as necessary, and agree with the descriptions/interpretations enumerated on this summary page. Reviewing Physician:  Signed By: Moises Blood, M.B., CH.B. Date: 10/13/2016  Other Result Information  Interface, Text Results In - 10/13/2016 10:46 PM EST Recording Start Date/Time: 09/27/2016 9:42:00 AM Min HR: 53 BPM At: 09/28/2016 6:42:53 AM Max HR: 128 BPM At: 09/29/2016 7:40:28 AM Avg HR: 82 BPM Pauses > 2081ms:  0 Longest RR: 1.364 at 09/28/2016 6:28:05 AM  Ventricular Beats: 3 Couplets:  0 Runs: 0 Fastest Run:  BPM at  Longest Ventricular Run:  BPM at  Supraventricular Beats: 17 Supraventricular Fastest Run:  BPM at   Referring Physician: Cristina Gong ([id number]) Processing Location: [facility] Procedure Type: HOLTER SCAN & ANALYSIS Date Processed: 10/13/2016 Recording Duration: 47 hr, 51 min Hookup Technician: Elisabeth Pigeon, CT Analyst: Clydene Laming, CT Recorder Number:   Diagnosis: Palpitations [R00.2]  Conclusions: 1) The quality of this 48 hour Holter was good. 2) The patient was in sinus rhythm, sinus bradycardia, sinus tachycardia.  3) Ventricular ectopic activity consisted of rare unifocal PVCs (strips 5, 14) (<1% of total beats). 4)  Supraventricular ectopic activity consisted of rare PACs (strips 11, 12) (<1% of total beats), an isolated atrial pair (strip 18).  5) There were no pauses greater than 2.0 seconds noted. 6) Symptoms of "palpitations" and event button activations  correlated with sinus rhythm, sinus tachycardia (strips 7, 10, 16, 17).   I have reviewed the ECG tracings and report, amended them as necessary, and agree with the descriptions/interpretations enumerated on this summary page. Reviewing Physician:  Signed By: Moises Blood, M.B., CH.B. Date: 10/13/2016  Status Results Details   Encounter Summary

## 2017-08-04 NOTE — Pre-Procedure Instructions (Signed)
Elio Forget, MD - 06/13/2017 3:40 PM EST Formatting of this note might be different from the original. Primary Care Provider Luciana Axe, NP [address] [place] [address]  Nicole Horton is a 46 y.o. female who returns to the office for follow-up of a myocardial bridge and palpitations.  HPI Nicole Horton returns to the office today for scheduled follow-up. At present, she is feeling well. She had lost her health care insurance but has recently been employed by [employer].  She continues to have some intermittent palpitations. There is been no near syncope or syncope. She complains of fatigue. She has had no chest discomfort. She has been off her medications for a prolonged period of time. She is started back on her metoprolol recently.  A recent lipid panel demonstrated a total serum cholesterol of 249 with an LDL of 161. Her values were placed in the AHA risk calculator and this demonstrates a very low risk of cardiovascular events (approximately 4%) in the next decade. The most advantageous intervention she could make would be smoking cessation.  Past Medical History Problem List Date Reviewed: 12-08-2016  Codes Priority Class Noted - Resolved  Gastroesophageal reflux disease without esophagitis ICD-10-CM: K21.9 ICD-9-CM: 530.81 06/13/2017 - Present  Frequent UTI ICD-10-CM: N39.0 ICD-9-CM: 599.0 06/13/2017 - Present  RLS (restless legs syndrome) ICD-10-CM: G25.81 ICD-9-CM: 333.94 06/13/2017 - Present  Obesity (BMI 30-39.9), unspecified ICD-10-CM: E66.9 ICD-9-CM: 278.00 Unknown - Present  Palpitations ICD-10-CM: R00.2 ICD-9-CM: 785.1 09/27/2016 - Present  Essential hypertension ICD-10-CM: I10 ICD-9-CM: 401.9 07/31/2015 - Present  Pelvic pain in female ICD-10-CM: R10.2 ICD-9-CM: 625.9 Unknown - Present  Myocardial bridge ICD-10-CM: 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 97 cm, Lt cyst .88     Current Medications Current Outpatient Medications  Medication Sig Dispense Refill  . aspirin 81 MG EC tablet Take 81 mg by mouth once daily.  . fluticasone (FLONASE) 50 mcg/actuation nasal spray Place 2 sprays into both nostrils once daily 16 g 11  . metoprolol tartrate (LOPRESSOR) 25 MG tablet Take 0.5 tablets (12.5 mg total) by mouth 2 (two) times daily 180 tablet 3  . omeprazole (PRILOSEC) 40 MG DR capsule Take 1 capsule (40 mg total) by mouth once daily 30 capsule 3  . rOPINIRole (REQUIP) 0.25 MG tablet Take 1 tablet by mouth daily for 1 week and then increase to 2 tablets by mouth 1-3 hours before bedtime. 60 tablet 11   No current facility-administered medications for this visit.   Allergies Codeine  reports that she has been smoking cigarettes. She has been smoking about 0.50 packs per day. She has never used smokeless tobacco.  ROS  Otherwise the 10-point review of systems was negative except for as described above in the HPI.  Physical Examination Blood pressure 110/54, pulse 72, temperature 36.8 C (98.2 F), temperature source Oral, resp. rate 16, height 156.2 cm (5' 1.5"), weight 80 kg (176 lb 5.9 oz), SpO2 99 %. Nicole Horton is a well developed, well nourished female in NAD. Alert and oriented x 3. The sclerae were non-icteric and conjunctiva pink. The oral mucosa was moist and without lesion. The neck was soft and supple without nodes, masses, bruit or thyromegaly. The lung  fields were clear The cardiovascular examination demonstrated a regular rate and rhythm. There was a normal first and second heart sound. The JVP was not above the clavicle at 90 degrees. The abdomen was soft, non-tender and with active bowel sounds. There was no LE edema. The  neurological evaluation was non-focal.  When we saw her last, she had a 48-hour Holter monitor. This demonstrated rare ventricular and supraventricular ectopy. There were no concerning arrhythmias. Her symptoms of palpitations correlated with sinus tachycardia.  Assessment and Plan:  1. Palpitations. These seem to be most likely related to sinus tachycardia. She is back on her beta-blocker. No other intervention is required at this time.  2. Myocardial bridge. 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  3. Tobacco use. We have encouraged her to stop smoking.  4. Hyperlipidemia. While her cholesterol is elevated, her risk of cardiovascular events is low. Therefore we elected not to place her on a statin.  Wiliam Ke, MD Division of Cardiology  This note was generated with voice recognition software and an electronic medical record. Please excuse grammatical, spelling and punctuation errors.   Electronically Signed by Elio Forget, MD on 06/13/2017 3:40 PM EST     Plan of Treatment - as of this encounter  Upcoming Encounters Upcoming Encounters  Date Type Specialty Care Team Description  10/10/2017 Office Visit Family Medicine Marni Griffon, MD  [place]  [address]  [place], [place] [address]  [phone]  [phone] (Fax)    Luciana Axe, NP  [place]  [address]  [place], [place] [address]  [phone]  [phone] (Fax)     Visit Diagnoses - in this encounter  Diagnosis  Heart palpitations - Primary  Palpitations   Coronary-myocardial bridge   Myocardial bridge    Discontinued Medications - as of this encounter  Medication Sig Discontinue Reason Start Date End Date  metoprolol tartrate (LOPRESSOR) 25 MG tablet  Indications: Myocardial bridge Take 0.5 tablets (12.5 mg total) by mouth 2 (two) times daily Reorder 06/10/2017 06/13/2017   Images Document Information  Primary Care Provider Other Service  Providers Document Coverage Dates  Amy Overton Mam NP (Nov. 02, 2018 - Present) [phone] (Work) [phone] (Fax) [address] [place], [place] [address]   Nov. 05, 2018   [place] [address] [place], [place] [address]   Encounter Providers Encounter Date  Elio Forget MD (Attending) [phone] (Work) [phone] (Fax) [facility] [address] [place], [place] [address]  Nov. 05, 2018

## 2017-08-05 ENCOUNTER — Encounter
Admission: RE | Admit: 2017-08-05 | Discharge: 2017-08-05 | Disposition: A | Discharge status: Home / Self Care | Payer: 59 | Source: Ambulatory Visit | Attending: Obstetrics & Gynecology | Admitting: Obstetrics & Gynecology

## 2017-08-05 DIAGNOSIS — R002 Palpitations: Secondary | ICD-10-CM | POA: Insufficient documentation

## 2017-08-05 DIAGNOSIS — Q245 Malformation of coronary vessels: Secondary | ICD-10-CM | POA: Diagnosis not present

## 2017-08-05 DIAGNOSIS — Z01810 Encounter for preprocedural cardiovascular examination: Secondary | ICD-10-CM | POA: Insufficient documentation

## 2017-08-05 DIAGNOSIS — Z01812 Encounter for preprocedural laboratory examination: Secondary | ICD-10-CM | POA: Insufficient documentation

## 2017-08-05 DIAGNOSIS — R9431 Abnormal electrocardiogram [ECG] [EKG]: Secondary | ICD-10-CM | POA: Insufficient documentation

## 2017-08-05 LAB — CBC
HCT: 42.8 % (ref 35.0–47.0)
Hemoglobin: 14.6 g/dL (ref 12.0–16.0)
MCH: 30.6 pg (ref 26.0–34.0)
MCHC: 34.1 g/dL (ref 32.0–36.0)
MCV: 90 fL (ref 80.0–100.0)
PLATELETS: 298 10*3/uL (ref 150–440)
RBC: 4.75 MIL/uL (ref 3.80–5.20)
RDW: 13.3 % (ref 11.5–14.5)
WBC: 6.4 10*3/uL (ref 3.6–11.0)

## 2017-08-05 LAB — TYPE AND SCREEN
ABO/RH(D): A POS
Antibody Screen: NEGATIVE

## 2017-08-08 ENCOUNTER — Encounter: Payer: Self-pay | Admitting: *Deleted

## 2017-08-08 NOTE — Pre-Procedure Instructions (Signed)
SPOKE WITH SHERRY FIELD RN ABOUT PT WITH MYOCARDIAL BRIDGE. PT WAS JUST SEEN IN CARDIOLOGY OFFICE ON 06-13-17.  SHERRY SAID I DID NOT NEED TO NOTIFY ANESTHESIA BUT STATES JUST TO SEND NOTE TO CARDIOLOGIST TO  SEE IF PT IS OK TO PROCEED WITH HER UPCOMING SURGERY.  OFFICES CLOSED ON 08-09-16 FOR NEW YEARS AND I AM NOT HERE ON Wednesday SO I INFORMED MARCIA ABOUT THIS WHO WILL BE HERE

## 2017-08-10 MED ORDER — CEFOXITIN SODIUM-DEXTROSE 2-2.2 GM-%(50ML) IV SOLR
2.0000 g | Freq: Once | INTRAVENOUS | Status: AC
  Administered 2017-08-11: 2 g via INTRAVENOUS

## 2017-08-10 NOTE — Pre-Procedure Instructions (Addendum)
SPOKE WITH DR ROGER'S OFFICE AND ASKED FOR INDICATION OK FOR SURGERY. SEEN BY HIM  06/13/17 AND NOTE ON CHART. SHOULD NOT HOLD UP SURGERY IF NOT RECEIVED. SPOKE WITH MIRIAM AT DR ROGER;S OFFICE

## 2017-08-11 ENCOUNTER — Encounter: Payer: Self-pay | Admitting: *Deleted

## 2017-08-11 ENCOUNTER — Encounter
Admission: RE | Disposition: A | Discharge status: Home / Self Care | Payer: Self-pay | Source: Ambulatory Visit | Attending: Obstetrics & Gynecology

## 2017-08-11 ENCOUNTER — Ambulatory Visit: Payer: Managed Care, Other (non HMO) | Admitting: Anesthesiology

## 2017-08-11 ENCOUNTER — Ambulatory Visit
Admission: RE | Admit: 2017-08-11 | Discharge: 2017-08-11 | Disposition: A | Discharge status: Home / Self Care | Payer: Managed Care, Other (non HMO) | Source: Ambulatory Visit | Attending: Obstetrics & Gynecology | Admitting: Obstetrics & Gynecology

## 2017-08-11 DIAGNOSIS — N888 Other specified noninflammatory disorders of cervix uteri: Secondary | ICD-10-CM | POA: Insufficient documentation

## 2017-08-11 DIAGNOSIS — F17200 Nicotine dependence, unspecified, uncomplicated: Secondary | ICD-10-CM | POA: Insufficient documentation

## 2017-08-11 DIAGNOSIS — I4891 Unspecified atrial fibrillation: Secondary | ICD-10-CM | POA: Diagnosis not present

## 2017-08-11 DIAGNOSIS — D259 Leiomyoma of uterus, unspecified: Secondary | ICD-10-CM | POA: Diagnosis not present

## 2017-08-11 DIAGNOSIS — N921 Excessive and frequent menstruation with irregular cycle: Secondary | ICD-10-CM | POA: Diagnosis present

## 2017-08-11 DIAGNOSIS — E785 Hyperlipidemia, unspecified: Secondary | ICD-10-CM | POA: Insufficient documentation

## 2017-08-11 DIAGNOSIS — N838 Other noninflammatory disorders of ovary, fallopian tube and broad ligament: Secondary | ICD-10-CM | POA: Insufficient documentation

## 2017-08-11 DIAGNOSIS — K219 Gastro-esophageal reflux disease without esophagitis: Secondary | ICD-10-CM | POA: Insufficient documentation

## 2017-08-11 DIAGNOSIS — Z79899 Other long term (current) drug therapy: Secondary | ICD-10-CM | POA: Diagnosis not present

## 2017-08-11 DIAGNOSIS — Z87442 Personal history of urinary calculi: Secondary | ICD-10-CM | POA: Insufficient documentation

## 2017-08-11 DIAGNOSIS — I10 Essential (primary) hypertension: Secondary | ICD-10-CM | POA: Diagnosis not present

## 2017-08-11 DIAGNOSIS — N939 Abnormal uterine and vaginal bleeding, unspecified: Secondary | ICD-10-CM | POA: Diagnosis present

## 2017-08-11 HISTORY — PX: LAPAROSCOPIC HYSTERECTOMY: SHX1926

## 2017-08-11 HISTORY — PX: CYSTOSCOPY: SHX5120

## 2017-08-11 HISTORY — DX: Acute sinusitis, unspecified: J01.90

## 2017-08-11 LAB — POCT PREGNANCY, URINE: Preg Test, Ur: NEGATIVE

## 2017-08-11 LAB — ABO/RH: ABO/RH(D): A POS

## 2017-08-11 SURGERY — HYSTERECTOMY, TOTAL, LAPAROSCOPIC
Anesthesia: General | Laterality: Bilateral

## 2017-08-11 MED ORDER — BUPIVACAINE HCL (PF) 0.5 % IJ SOLN
INTRAMUSCULAR | Status: DC | PRN
  Administered 2017-08-11: 10 mL

## 2017-08-11 MED ORDER — MIDAZOLAM HCL 2 MG/2ML IJ SOLN
INTRAMUSCULAR | Status: AC
  Filled 2017-08-11: qty 2

## 2017-08-11 MED ORDER — PROPOFOL 10 MG/ML IV BOLUS
INTRAVENOUS | Status: AC
  Filled 2017-08-11: qty 20

## 2017-08-11 MED ORDER — MIDAZOLAM HCL 2 MG/2ML IJ SOLN
1.0000 mg | Freq: Once | INTRAMUSCULAR | Status: AC
  Administered 2017-08-11: 1 mg via INTRAVENOUS

## 2017-08-11 MED ORDER — SUGAMMADEX SODIUM 200 MG/2ML IV SOLN
INTRAVENOUS | Status: AC
  Filled 2017-08-11: qty 2

## 2017-08-11 MED ORDER — MEPERIDINE HCL 50 MG/ML IJ SOLN: 6.2500 mg | INTRAMUSCULAR | Status: DC | PRN

## 2017-08-11 MED ORDER — FENTANYL CITRATE (PF) 100 MCG/2ML IJ SOLN
INTRAMUSCULAR | Status: AC
  Filled 2017-08-11: qty 2

## 2017-08-11 MED ORDER — DEXAMETHASONE SODIUM PHOSPHATE 10 MG/ML IJ SOLN
INTRAMUSCULAR | Status: DC | PRN
  Administered 2017-08-11: 10 mg via INTRAVENOUS

## 2017-08-11 MED ORDER — OXYCODONE-ACETAMINOPHEN 5-325 MG PO TABS: 1.0000 | ORAL_TABLET | ORAL | 0 refills | Status: DC | PRN

## 2017-08-11 MED ORDER — FENTANYL CITRATE (PF) 100 MCG/2ML IJ SOLN
25.0000 ug | INTRAMUSCULAR | Status: DC | PRN
  Administered 2017-08-11 (×3): 50 ug via INTRAVENOUS

## 2017-08-11 MED ORDER — LACTATED RINGERS IV SOLN
INTRAVENOUS | Status: DC
  Administered 2017-08-11: 11:00:00 via INTRAVENOUS

## 2017-08-11 MED ORDER — ROCURONIUM BROMIDE 100 MG/10ML IV SOLN
INTRAVENOUS | Status: DC | PRN
  Administered 2017-08-11: 15 mg via INTRAVENOUS
  Administered 2017-08-11: 40 mg via INTRAVENOUS

## 2017-08-11 MED ORDER — LACTATED RINGERS IV SOLN: INTRAVENOUS | Status: DC

## 2017-08-11 MED ORDER — PHENYLEPHRINE HCL 10 MG/ML IJ SOLN
INTRAMUSCULAR | Status: DC | PRN
  Administered 2017-08-11 (×2): 100 ug via INTRAVENOUS

## 2017-08-11 MED ORDER — LIDOCAINE HCL (CARDIAC) 20 MG/ML IV SOLN
INTRAVENOUS | Status: DC | PRN
  Administered 2017-08-11: 40 mg via INTRAVENOUS

## 2017-08-11 MED ORDER — HYDROMORPHONE HCL 1 MG/ML IJ SOLN: 0.5000 mg | INTRAMUSCULAR | Status: DC | PRN

## 2017-08-11 MED ORDER — ONDANSETRON HCL 4 MG/2ML IJ SOLN
INTRAMUSCULAR | Status: DC | PRN
  Administered 2017-08-11: 4 mg via INTRAVENOUS

## 2017-08-11 MED ORDER — PROMETHAZINE HCL 25 MG/ML IJ SOLN
6.2500 mg | INTRAMUSCULAR | Status: DC | PRN
  Administered 2017-08-11: 6.25 mg via INTRAVENOUS

## 2017-08-11 MED ORDER — KETOROLAC TROMETHAMINE 30 MG/ML IJ SOLN: 30.0000 mg | Freq: Four times a day (QID) | INTRAMUSCULAR | Status: DC

## 2017-08-11 MED ORDER — CEFOXITIN SODIUM-DEXTROSE 2-2.2 GM-%(50ML) IV SOLR
INTRAVENOUS | Status: AC
  Filled 2017-08-11: qty 50

## 2017-08-11 MED ORDER — BUPIVACAINE HCL (PF) 0.5 % IJ SOLN
INTRAMUSCULAR | Status: AC
  Filled 2017-08-11: qty 30

## 2017-08-11 MED ORDER — ACETAMINOPHEN 650 MG RE SUPP: 650.0000 mg | RECTAL | Status: DC | PRN

## 2017-08-11 MED ORDER — FENTANYL CITRATE (PF) 100 MCG/2ML IJ SOLN
INTRAMUSCULAR | Status: AC
  Administered 2017-08-11: 50 ug via INTRAVENOUS
  Filled 2017-08-11: qty 2

## 2017-08-11 MED ORDER — SODIUM CHLORIDE 0.9 % IJ SOLN
INTRAMUSCULAR | Status: AC
  Filled 2017-08-11: qty 10

## 2017-08-11 MED ORDER — PROPOFOL 10 MG/ML IV BOLUS
INTRAVENOUS | Status: DC | PRN
  Administered 2017-08-11: 150 mg via INTRAVENOUS

## 2017-08-11 MED ORDER — MORPHINE SULFATE (PF) 4 MG/ML IV SOLN
1.0000 mg | INTRAVENOUS | Status: DC | PRN
Start: 2017-08-11 — End: 2017-08-11

## 2017-08-11 MED ORDER — OXYCODONE HCL 5 MG PO TABS
5.0000 mg | ORAL_TABLET | Freq: Once | ORAL | Status: AC | PRN
  Administered 2017-08-11: 5 mg via ORAL

## 2017-08-11 MED ORDER — OXYCODONE HCL 5 MG/5ML PO SOLN: 5.0000 mg | Freq: Once | ORAL | Status: AC | PRN

## 2017-08-11 MED ORDER — ACETAMINOPHEN 325 MG PO TABS: 650.0000 mg | ORAL_TABLET | ORAL | Status: DC | PRN

## 2017-08-11 MED ORDER — FENTANYL CITRATE (PF) 100 MCG/2ML IJ SOLN
INTRAMUSCULAR | Status: DC | PRN
  Administered 2017-08-11 (×2): 50 ug via INTRAVENOUS
  Administered 2017-08-11: 100 ug via INTRAVENOUS

## 2017-08-11 MED ORDER — MIDAZOLAM HCL 2 MG/2ML IJ SOLN
INTRAMUSCULAR | Status: DC | PRN
  Administered 2017-08-11: 2 mg via INTRAVENOUS

## 2017-08-11 MED ORDER — OXYCODONE HCL 5 MG PO TABS
ORAL_TABLET | ORAL | Status: AC
  Filled 2017-08-11: qty 1

## 2017-08-11 MED ORDER — PROMETHAZINE HCL 25 MG/ML IJ SOLN
INTRAMUSCULAR | Status: AC
  Filled 2017-08-11: qty 1

## 2017-08-11 MED ORDER — HYDROMORPHONE HCL 1 MG/ML IJ SOLN
INTRAMUSCULAR | Status: AC
  Filled 2017-08-11: qty 1

## 2017-08-11 SURGICAL SUPPLY — 50 items
BAG URINE DRAINAGE (UROLOGICAL SUPPLIES) ×4 IMPLANT
BLADE SURG SZ11 CARB STEEL (BLADE) ×4 IMPLANT
CANISTER SUCT 1200ML W/VALVE (MISCELLANEOUS) ×4 IMPLANT
CATH FOLEY 2WAY  5CC 16FR (CATHETERS) ×2
CATH URTH 16FR FL 2W BLN LF (CATHETERS) ×2 IMPLANT
CHLORAPREP W/TINT 26ML (MISCELLANEOUS) ×4 IMPLANT
DEFOGGER SCOPE WARMER CLEARIFY (MISCELLANEOUS) ×4 IMPLANT
DERMABOND ADVANCED (GAUZE/BANDAGES/DRESSINGS) ×2
DERMABOND ADVANCED .7 DNX12 (GAUZE/BANDAGES/DRESSINGS) ×2 IMPLANT
DEVICE SUTURE ENDOST 10MM (ENDOMECHANICALS) ×4 IMPLANT
DRAPE CAMERA CLOSED 9X96 (DRAPES) ×4 IMPLANT
DRSG TEGADERM 2-3/8X2-3/4 SM (GAUZE/BANDAGES/DRESSINGS) ×12 IMPLANT
ENDOSTITCH 0 SINGLE 48 (SUTURE) ×8 IMPLANT
GAUZE SPONGE NON-WVN 2X2 STRL (MISCELLANEOUS) ×4 IMPLANT
GLOVE BIO SURGEON STRL SZ8 (GLOVE) ×20 IMPLANT
GLOVE INDICATOR 8.0 STRL GRN (GLOVE) ×20 IMPLANT
GOWN STRL REUS W/ TWL LRG LVL3 (GOWN DISPOSABLE) ×2 IMPLANT
GOWN STRL REUS W/ TWL XL LVL3 (GOWN DISPOSABLE) ×4 IMPLANT
GOWN STRL REUS W/TWL LRG LVL3 (GOWN DISPOSABLE) ×2
GOWN STRL REUS W/TWL XL LVL3 (GOWN DISPOSABLE) ×4
GRASPER SUT TROCAR 14GX15 (MISCELLANEOUS) ×4 IMPLANT
IRRIGATION STRYKERFLOW (MISCELLANEOUS) ×2 IMPLANT
IRRIGATOR STRYKERFLOW (MISCELLANEOUS) ×4
IV LACTATED RINGERS 1000ML (IV SOLUTION) ×8 IMPLANT
KIT PINK PAD W/HEAD ARE REST (MISCELLANEOUS) ×4
KIT PINK PAD W/HEAD ARM REST (MISCELLANEOUS) ×2 IMPLANT
KIT RM TURNOVER CYSTO AR (KITS) ×4 IMPLANT
LABEL OR SOLS (LABEL) ×4 IMPLANT
MANIPULATOR VCARE LG CRV RETR (MISCELLANEOUS) IMPLANT
MANIPULATOR VCARE SML CRV RETR (MISCELLANEOUS) ×4 IMPLANT
MANIPULATOR VCARE STD CRV RETR (MISCELLANEOUS) IMPLANT
NEEDLE VERESS 14GA 120MM (NEEDLE) ×4 IMPLANT
NS IRRIG 500ML POUR BTL (IV SOLUTION) ×4 IMPLANT
OCCLUDER COLPOPNEUMO (BALLOONS) ×4 IMPLANT
PACK GYN LAPAROSCOPIC (MISCELLANEOUS) ×4 IMPLANT
PAD OB MATERNITY 4.3X12.25 (PERSONAL CARE ITEMS) ×4 IMPLANT
PAD PREP 24X41 OB/GYN DISP (PERSONAL CARE ITEMS) ×4 IMPLANT
SCISSORS METZENBAUM CVD 33 (INSTRUMENTS) IMPLANT
SET CYSTO W/LG BORE CLAMP LF (SET/KITS/TRAYS/PACK) ×4 IMPLANT
SHEARS HARMONIC ACE PLUS 36CM (ENDOMECHANICALS) ×4 IMPLANT
SLEEVE ENDOPATH XCEL 5M (ENDOMECHANICALS) ×4 IMPLANT
SPONGE VERSALON 2X2 STRL (MISCELLANEOUS) ×4
SUT ENDO VLOC 180-0-8IN (SUTURE) ×4 IMPLANT
SUT VIC AB 0 CT1 36 (SUTURE) ×4 IMPLANT
SUT VIC AB 4-0 FS2 27 (SUTURE) ×4 IMPLANT
SYR 10ML LL (SYRINGE) ×4 IMPLANT
SYR 50ML LL SCALE MARK (SYRINGE) ×4 IMPLANT
TROCAR ENDO BLADELESS 11MM (ENDOMECHANICALS) ×4 IMPLANT
TROCAR XCEL NON-BLD 5MMX100MML (ENDOMECHANICALS) ×4 IMPLANT
TUBING INSUF HEATED (TUBING) ×4 IMPLANT

## 2017-08-11 NOTE — Discharge Instructions (Signed)
Total Laparoscopic Hysterectomy, Care After °Refer to this sheet in the next few weeks. These instructions provide you with information on caring for yourself after your procedure. Your health care provider may also give you more specific instructions. Your treatment has been planned according to current medical practices, but problems sometimes occur. Call your health care provider if you have any problems or questions after your procedure. °What can I expect after the procedure? °· Pain and bruising at the incision sites. You will be given pain medicine to control it. °· Menopausal symptoms such as hot flashes, night sweats, and insomnia if your ovaries were removed. °· Sore throat from the breathing tube that was inserted during surgery. °Follow these instructions at home: °· Only take over-the-counter or prescription medicines for pain, discomfort, or fever as directed by your health care provider. °· Do not take aspirin. It can cause bleeding. °· Do not drive when taking pain medicine. °· Follow your health care provider's advice regarding diet, exercise, lifting, driving, and general activities. °· Resume your usual diet as directed and allowed. °· Get plenty of rest and sleep. °· Do not douche, use tampons, or have sexual intercourse for at least 6 weeks, or until your health care provider gives you permission. °· Change your bandages (dressings) as directed by your health care provider. °· Monitor your temperature and notify your health care provider of a fever. °· Take showers instead of baths for 2-3 weeks. °· Do not drink alcohol until your health care provider gives you permission. °· If you develop constipation, you may take a mild laxative with your health care provider's permission. Bran foods may help with constipation problems. Drinking enough fluids to keep your urine clear or pale yellow may help as well. °· Try to have someone home with you for 1-2 weeks to help around the house. °· Keep all of  your follow-up appointments as directed by your health care provider. °Contact a health care provider if: °· You have swelling, redness, or increasing pain around your incision sites. °· You have pus coming from your incision. °· You notice a bad smell coming from your incision. °· Your incision breaks open. °· You feel dizzy or lightheaded. °· You have pain or bleeding when you urinate. °· You have persistent diarrhea. °· You have persistent nausea and vomiting. °· You have abnormal vaginal discharge. °· You have a rash. °· You have any type of abnormal reaction or develop an allergy to your medicine. °· You have poor pain control with your prescribed medicine. °Get help right away if: °· You have chest pain or shortness of breath. °· You have severe abdominal pain that is not relieved with pain medicine. °· You have pain or swelling in your legs. °This information is not intended to replace advice given to you by your health care provider. Make sure you discuss any questions you have with your health care provider. °Document Released: 05/16/2013 Document Revised: 01/01/2016 Document Reviewed: 02/13/2013 °Elsevier Interactive Patient Education © 2017 [employer] ° °AMBULATORY SURGERY  °DISCHARGE INSTRUCTIONS ° ° °1) The drugs that you were given will stay in your system until tomorrow so for the next 24 hours you should not: ° °A) Drive an automobile °B) Make any legal decisions °C) Drink any alcoholic beverage ° ° °2) You may resume regular meals tomorrow.  Today it is better to start with liquids and gradually work up to solid foods. ° °You may eat anything you prefer, but it is better   better to start with liquids, then soup and crackers, and gradually work up to solid foods.   3) Please notify your doctor immediately if you have any unusual bleeding, trouble breathing, redness and pain at the surgery site, drainage, fever, or pain not relieved by medication.  Please contact your physician with any problems or  Same Day Surgery at [phone], Monday through Friday 6 am to 4 pm, or [place] at [facility] number at [phone].

## 2017-08-11 NOTE — Anesthesia Procedure Notes (Signed)
Date/Time: 08/11/2017 12:18 PM Performed by: Allean Found, CRNA Pre-anesthesia Checklist: Patient identified, Emergency Drugs available, Suction available, Patient being monitored and Timeout performed Patient Re-evaluated:Patient Re-evaluated prior to induction Oxygen Delivery Method: Circle system utilized Preoxygenation: Pre-oxygenation with 100% oxygen Induction Type: IV induction Ventilation: Mask ventilation without difficulty Laryngoscope Size: Mac and 3 Tube size: 7.0 mm Number of attempts: 1 Airway Equipment and Method: Stylet Placement Confirmation: ETT inserted through vocal cords under direct vision,  positive ETCO2 and breath sounds checked- equal and bilateral Secured at: 23 cm Tube secured with: Tape Dental Injury: Teeth and Oropharynx as per pre-operative assessment

## 2017-08-11 NOTE — Anesthesia Preprocedure Evaluation (Signed)
Anesthesia Evaluation  Patient identified by MRN, date of birth, ID band Patient awake    Reviewed: Allergy & Precautions, NPO status , Patient's Chart, lab work & pertinent test results  History of Anesthesia Complications Negative for: history of anesthetic complications  Airway Mallampati: II  TM Distance: >3 FB Neck ROM: Full    Dental  (+) Implants   Pulmonary neg sleep apnea, neg COPD, Current Smoker,    breath sounds clear to auscultation- rhonchi (-) wheezing      Cardiovascular hypertension, (-) CAD, (-) Past MI and (-) Cardiac Stents Dysrhythmias: palpitations.  Rhythm:Regular Rate:Normal - Systolic murmurs and - Diastolic murmurs    Neuro/Psych Anxiety negative neurological ROS     GI/Hepatic Neg liver ROS, GERD  ,  Endo/Other  negative endocrine ROSneg diabetes  Renal/GU Renal disease: hx of nephrolithiasis.     Musculoskeletal negative musculoskeletal ROS (+)   Abdominal (+) + obese,   Peds  Hematology negative hematology ROS (+)   Anesthesia Other Findings Past Medical History: No date: A-fib ([place]) 05/15/2014: Abnormal uterine bleeding     Comment:  Overview:  [place]: shows 1. 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   Reproductive/Obstetrics                             Anesthesia Physical Anesthesia Plan  ASA: II  Anesthesia Plan: General   Post-op Pain Management:    Induction: Intravenous  PONV Risk Score and Plan: 1 and Ondansetron and Dexamethasone  Airway Management Planned: Oral  ETT  Additional Equipment:   Intra-op Plan:   Post-operative Plan: Extubation in OR  Informed Consent: I have reviewed the patients History and Physical, chart, labs and discussed the procedure including the risks, benefits and alternatives for the proposed anesthesia with the patient or authorized representative who has indicated his/her understanding and acceptance.   Dental advisory given  Plan Discussed with: CRNA and Anesthesiologist  Anesthesia Plan Comments:         Anesthesia Quick Evaluation

## 2017-08-11 NOTE — Anesthesia Post-op Follow-up Note (Signed)
Anesthesia QCDR form completed.        

## 2017-08-11 NOTE — Transfer of Care (Signed)
Immediate Anesthesia Transfer of Care Note  Patient: Nicole Horton  Procedure(s) Performed: HYSTERECTOMY TOTAL LAPAROSCOPIC BILATERAL SALPINGECTOMY (Bilateral )  Patient Location: PACU  Anesthesia Type:General  Level of Consciousness: sedated  Airway & Oxygen Therapy: Patient Spontanous Breathing and Patient connected to face mask oxygen  Post-op Assessment: Report given to RN and Post -op Vital signs reviewed and stable  Post vital signs: Reviewed and stable  Last Vitals:  Vitals:   08/11/17 1050 08/11/17 1344  BP: (!) 105/47 119/90  Pulse: 63 82  Resp: 17 14  Temp: 36.7 C 36.8 C  SpO2: 94% 100%    Last Pain:  Vitals:   08/11/17 1050  TempSrc: Tympanic         Complications: No apparent anesthesia complications

## 2017-08-11 NOTE — H&P (Signed)
History and Physical Interval Note:  08/11/2017 11:32 AM  Nicole Horton  has presented today for surgery, with the diagnosis of MENOMETRORRHAGIA,PELVIC PAIN  The various methods of treatment have been discussed with the patient and family. After consideration of risks, benefits and other options for treatment, the patient has consented to  Procedure(s): HYSTERECTOMY TOTAL LAPAROSCOPIC BILATERAL SALPINGECTOMY (Bilateral) as a surgical intervention .  The patient's history has been reviewed, patient examined, no change in status, stable for surgery.  Pt has the following beta blocker history-  Not taking Beta Blocker.  I have reviewed the patient's chart and labs.  Questions were answered to the patient's satisfaction.    Barnett Applebaum, MD, [facility], [facility] 08/11/2017  11:32 AM

## 2017-08-11 NOTE — Progress Notes (Signed)
Up to bathroom with assistance twice in post-op and voided. Did require phenergan IV for c/o nausea after first walking and did have relief.

## 2017-08-11 NOTE — Op Note (Signed)
Operative Report:  PRE-OP DIAGNOSIS: MENOMETRORRHAGIA,PELVIC PAIN   POST-OP DIAGNOSIS: MENOMETRORRHAGIA,PELVIC PAIN   PROCEDURE: Procedure(s): HYSTERECTOMY TOTAL LAPAROSCOPIC BILATERAL SALPINGECTOMY  SURGEON: Barnett Applebaum, MD, FACOG  ASSISTANT: Dr Georgianne Fick   ANESTHESIA: General endotracheal anesthesia  ESTIMATED BLOOD LOSS: less than 50   SPECIMENS: Uterus, Tubes.  COMPLICATIONS: None  DISPOSITION: stable to PACU  FINDINGS: Intraabdominal adhesions were not noted. Normal ovaries.  PROCEDURE:  The patient was taken to the OR where anesthesia was administed. She was prepped and draped in the normal sterile fashion in the dorsal lithotomy position in the [place] stirrups. A time out was performed. A Graves speculum was inserted, the cervix was grasped with a single tooth tenaculum and the endometrial cavity was sounded. The cervix was progressively dilated to a size 18 [place] with [employer] dilators. A V-Care uterine manipulator was inserted in the usual fashion without incident. Gloves were changed and attention was turned to the abdomen.   An infraumbilical transverse 67mm skin incision was made with the scalpel after local anesthesia applied to the skin. A Veress-step needle was inserted in the usual fashion and confirmed using the hanging drop technique. A pneumoperitoneum was obtained by insufflation of CO2 (opening pressure of 53mmHg) to 20mmHg. A diagnostic laparoscopy was performed yielding the previously described findings. Attention was turned to the left lower quadrant where after visualization of the inferior epigastric vessels a 74mm skin incision was made with the scalpel. A 5 mm laparoscopic port was inserted. The same procedure was repeated in the right lower quadrant with a 57mm trocar. Attention was turned to the left aspect of the uterus, where after visualization of the ureter, the round ligament was coagulated and transected using the 82mm Harmonic Scapel. The anterior and  posterior leafs of the broad ligament were dissected off as the anterior one was coagulated and transected in a caudal direction towards the cuff of the uterine manipulator.  Attention was then turned to the left fallopian tube which was recognized by visualization of the fimbria. The tube is excised to its attachment to the uterus. The uterine-ovarian ligament and its blood vessels were carefully coagulated and transected using the Harmonic scapel.  Attention was turned to the right aspect of the uterus where the same procedure was performed.  The vesicouterine reflection of the peritoneum was dissected with the harmonic scapel and the bladder flap was created bluntly.  The uterine vessels were coagulated and transected bilaterally using first bipolar cautery and then the harmonic scapel. A 360 degree, circumferential colpotomy was done to completely amputate the uterus with cervix and tubes. Once the specimen was amputated it was delivered through the vagina.   The colpotomy was repaired in a simple interrupted fashion using a 0-Polysorb suture with an endo-stitch device.  Vaginal exam confirms complete closure.  The cavity was copiously irrigated. A survey of the pelvic cavity revealed adequate hemostasis and no injury to bowel, bladder, or ureter.   A diagnostic cystoscopy was performed using saline distension of bladder with no lesions or injuries noted.  Bilateral urine flow from each ureteral orifice is visualized.  At this point the procedure was finalized. All the instruments were removed from the patient's body. Gas was expelled and patient is leveled.  Incisions are closed with skin adhesive.    Patient goes to recovery room in stable condition.  All sponge, instrument, and needle counts are correct x2.     Barnett Applebaum, MD, [facility], [facility] 08/11/2017  1:29 PM

## 2017-08-12 ENCOUNTER — Encounter: Payer: Self-pay | Admitting: Obstetrics & Gynecology

## 2017-08-12 LAB — SURGICAL PATHOLOGY

## 2017-08-12 NOTE — Anesthesia Postprocedure Evaluation (Signed)
Anesthesia Post Note  Patient: Shirl Harris  Procedure(s) Performed: HYSTERECTOMY TOTAL LAPAROSCOPIC BILATERAL SALPINGECTOMY (Bilateral ) CYSTOSCOPY  Patient location during evaluation: PACU Anesthesia Type: General Level of consciousness: awake and alert and oriented Pain management: pain level controlled Vital Signs Assessment: post-procedure vital signs reviewed and stable Respiratory status: spontaneous breathing, nonlabored ventilation and respiratory function stable Cardiovascular status: blood pressure returned to baseline and stable Postop Assessment: no signs of nausea or vomiting Anesthetic complications: no     Last Vitals:  Vitals:   08/11/17 1634 08/11/17 1706  BP: 130/74 117/66  Pulse: 93 70  Resp: 20 19  Temp:    SpO2: 96% 96%    Last Pain:  Vitals:   08/12/17 1127  TempSrc:   PainSc: 7                  Hania Cerone

## 2017-08-15 ENCOUNTER — Telehealth: Payer: Self-pay

## 2017-08-15 NOTE — Telephone Encounter (Signed)
Pt called to request that we contact the [employer] [facility] to notify them that her surgery has been completed on 08/11/17 for hysterectomy to start disability.   Called [facility] at [phone] and spoke to [facility] to notify them of surgery completion.

## 2017-08-16 ENCOUNTER — Emergency Department: Payer: Managed Care, Other (non HMO)

## 2017-08-16 ENCOUNTER — Encounter: Payer: Self-pay | Admitting: Emergency Medicine

## 2017-08-16 ENCOUNTER — Other Ambulatory Visit: Payer: Self-pay

## 2017-08-16 DIAGNOSIS — Z79899 Other long term (current) drug therapy: Secondary | ICD-10-CM | POA: Insufficient documentation

## 2017-08-16 DIAGNOSIS — K625 Hemorrhage of anus and rectum: Secondary | ICD-10-CM | POA: Diagnosis present

## 2017-08-16 DIAGNOSIS — K5901 Slow transit constipation: Secondary | ICD-10-CM | POA: Diagnosis not present

## 2017-08-16 DIAGNOSIS — I10 Essential (primary) hypertension: Secondary | ICD-10-CM | POA: Insufficient documentation

## 2017-08-16 DIAGNOSIS — F17210 Nicotine dependence, cigarettes, uncomplicated: Secondary | ICD-10-CM | POA: Diagnosis not present

## 2017-08-16 LAB — CBC
HEMATOCRIT: 41.9 % (ref 35.0–47.0)
Hemoglobin: 14.2 g/dL (ref 12.0–16.0)
MCH: 30.4 pg (ref 26.0–34.0)
MCHC: 33.9 g/dL (ref 32.0–36.0)
MCV: 89.6 fL (ref 80.0–100.0)
Platelets: 291 10*3/uL (ref 150–440)
RBC: 4.68 MIL/uL (ref 3.80–5.20)
RDW: 13.2 % (ref 11.5–14.5)
WBC: 9.8 10*3/uL (ref 3.6–11.0)

## 2017-08-16 NOTE — ED Triage Notes (Signed)
Patient ambulatory to triage with steady gait, without difficulty or distress noted; pt reports abd lap hysterectomy 5 days ago; no BM in 6 days; taking OTC without relief; c/o abd pain with nausea and rectal bleeding

## 2017-08-17 ENCOUNTER — Telehealth: Payer: Self-pay | Admitting: Obstetrics & Gynecology

## 2017-08-17 ENCOUNTER — Telehealth: Payer: Self-pay

## 2017-08-17 ENCOUNTER — Other Ambulatory Visit: Payer: Self-pay | Admitting: Obstetrics & Gynecology

## 2017-08-17 ENCOUNTER — Emergency Department
Admission: EM | Admit: 2017-08-17 | Discharge: 2017-08-17 | Disposition: A | Discharge status: Home / Self Care | Payer: Managed Care, Other (non HMO) | Attending: Emergency Medicine | Admitting: Emergency Medicine

## 2017-08-17 DIAGNOSIS — K5901 Slow transit constipation: Secondary | ICD-10-CM

## 2017-08-17 LAB — VISTASEQ BREAST AND GYN CANCER: Result Summary: POSITIVE — AB

## 2017-08-17 LAB — COMPREHENSIVE METABOLIC PANEL
ALT: 18 U/L (ref 14–54)
AST: 21 U/L (ref 15–41)
Albumin: 4 g/dL (ref 3.5–5.0)
Alkaline Phosphatase: 57 U/L (ref 38–126)
Anion gap: 7 (ref 5–15)
BUN: 12 mg/dL (ref 6–20)
CHLORIDE: 108 mmol/L (ref 101–111)
CO2: 23 mmol/L (ref 22–32)
Calcium: 9.1 mg/dL (ref 8.9–10.3)
Creatinine, Ser: 0.85 mg/dL (ref 0.44–1.00)
GFR calc Af Amer: >60 mL/min (ref >60)
Glucose, Bld: 115 mg/dL — ABNORMAL HIGH (ref 65–99)
POTASSIUM: 4.1 mmol/L (ref 3.5–5.1)
SODIUM: 138 mmol/L (ref 135–145)
Total Bilirubin: 0.4 mg/dL (ref 0.3–1.2)
Total Protein: 7.5 g/dL (ref 6.5–8.1)

## 2017-08-17 LAB — TYPE AND SCREEN
ABO/RH(D): A POS
Antibody Screen: NEGATIVE

## 2017-08-17 MED ORDER — MINERAL OIL RE ENEM
1.0000 | ENEMA | Freq: Once | RECTAL | Status: AC
Start: 2017-08-17 — End: 2017-08-17
  Administered 2017-08-17: 1 via RECTAL

## 2017-08-17 MED ORDER — MAGNESIUM CITRATE PO SOLN
1.0000 | Freq: Once | ORAL | Status: AC
  Administered 2017-08-17: 1 via ORAL
  Filled 2017-08-17: qty 296

## 2017-08-17 MED ORDER — POLYETHYLENE GLYCOL 3350 17 G PO PACK: 17.0000 g | PACK | Freq: Every day | ORAL | 0 refills | Status: DC | PRN

## 2017-08-17 MED ORDER — DOCUSATE SODIUM 100 MG PO CAPS
ORAL_CAPSULE | ORAL | Status: AC
  Administered 2017-08-17: 100 mg
  Filled 2017-08-17: qty 1

## 2017-08-17 MED ORDER — BISACODYL 10 MG RE SUPP: 10.0000 mg | RECTAL | 0 refills | Status: DC | PRN

## 2017-08-17 MED ORDER — DOCUSATE SODIUM 50 MG/5ML PO LIQD
100.0000 mg | Freq: Once | ORAL | Status: AC
  Administered 2017-08-17: 100 mg via ORAL
  Filled 2017-08-17: qty 10

## 2017-08-17 MED ORDER — POLYETHYLENE GLYCOL 3350 17 G PO PACK
17.0000 g | PACK | Freq: Every day | ORAL | Status: DC
  Administered 2017-08-17: 17 g via ORAL

## 2017-08-17 MED ORDER — OXYCODONE-ACETAMINOPHEN 5-325 MG PO TABS
1.0000 | ORAL_TABLET | Freq: Once | ORAL | Status: AC
  Administered 2017-08-17: 1 via ORAL
  Filled 2017-08-17: qty 1

## 2017-08-17 MED ORDER — POLYETHYLENE GLYCOL 3350 17 G PO PACK
PACK | ORAL | Status: AC
  Administered 2017-08-17: 17 g via ORAL
  Filled 2017-08-17: qty 1

## 2017-08-17 NOTE — Telephone Encounter (Signed)
Seen. TO be discussed at next visit as scheduled.

## 2017-08-17 NOTE — ED Notes (Signed)

## 2017-08-17 NOTE — Telephone Encounter (Signed)
D/w pt; Rx called in; will continue to try enema or suppository tx.

## 2017-08-17 NOTE — Telephone Encounter (Signed)
Nicole Horton, Dietitian from [employer], calling to be sure we received [facility] results.  If questions please call [phone] as pt is at risk.

## 2017-08-17 NOTE — Telephone Encounter (Signed)
Additional information for [facility] filled out and given to [place] for processing after signature obtained.

## 2017-08-17 NOTE — ED Notes (Signed)
esig pad did not transfer pt signature for d/c

## 2017-08-17 NOTE — ED Provider Notes (Signed)
[facility] Emergency Department Provider Note   First MD Initiated Contact with Patient 08/17/17 [phone]     (approximate)  I have reviewed the triage vital signs and the nursing notes.   HISTORY  Chief Complaint Rectal Bleeding   HPI Nicole Horton is a 47 y.o. female with history of recent total laparoscopic hysterectomy performed by Dr. Kenton Kingfisher on 08/11/2017 presents to the emergency department with constipation since 08/11/2017.  Patient admits to generalized abdominal discomfort sensation of fullness and need to have a bowel movement however unable to do so.  Patient states that she is taking Colace and fleets enema magnesium citrate at home without any results.  Patient has been taking Percocet very sparingly for pain stating she is only taken 5 tablets since the date of surgery.  Patient denies any fever.   Past Medical History:  Diagnosis Date  . A-fib ([place])   . Abnormal uterine bleeding 05/15/2014   Overview:  [place]: shows 1. 97 cm, Lt cyst .88   . Acute sinus infection 07/2017  . Adnexal pain 08/13/2015  . Anxiety    NO MEDS  . Dysrhythmia    PALPITATIONS  . Essential (primary) hypertension 07/31/2015  . GERD (gastroesophageal reflux disease)   . History of kidney stones   . Hyperlipidemia   . 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 Myocardial bridge     Patient Active Problem List   Diagnosis Date Noted  . Menometrorrhagia 07/07/2017  . Nephrolithiasis 08/14/2015  . Gross hematuria 08/14/2015  . Adnexal pain 08/13/2015  . Essential (primary) hypertension 07/31/2015  . Abnormal uterine bleeding 05/15/2014  . 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 ABDOMINAL HYSTERECTOMY    . CARDIAC CATHETERIZATION    . CYSTOSCOPY  08/11/2017   Procedure: CYSTOSCOPY;  Surgeon: Gae Dry, MD;  Location: [facility] ORS;  Service: Gynecology;;  .  ESOPHAGOGASTRODUODENOSCOPY (EGD) WITH ESOPHAGEAL DILATION    . LAPAROSCOPIC HYSTERECTOMY Bilateral 08/11/2017   Procedure: HYSTERECTOMY TOTAL LAPAROSCOPIC BILATERAL SALPINGECTOMY;  Surgeon: Gae Dry, MD;  Location: [facility] ORS;  Service: Gynecology;  Laterality: Bilateral;  . TUBAL LIGATION      Prior to Admission medications   Medication Sig Start Date End Date Taking? Authorizing Provider  acetaminophen (TYLENOL) 325 MG tablet Take 650 mg by mouth every 6 (six) hours as needed for moderate pain or headache.    [provider]  aspirin EC 81 MG tablet Take 81 mg by mouth daily. PT RAN OUT OF HER ASA 2-3 WEEKS AGO AND STATES THAT HER PCP SAID SHE DID NOT NEED TO TAKE HER ASA ANYMORE (STATED ON 08-04-17)    [provider]  ibuprofen (ADVIL,MOTRIN) 200 MG tablet Take 400 mg by mouth every 6 (six) hours as needed for headache or moderate pain.    [provider]  metoprolol tartrate (LOPRESSOR) 25 MG tablet Take 12.5 mg by mouth 2 (two) times daily.    [provider]  naproxen (NAPROSYN) 500 MG tablet Take 1 tablet (500 mg total) by mouth 2 (two) times daily with a meal. Patient not taking: Reported on 07/26/2017 08/07/15   Carrie Mew, MD  ondansetron (ZOFRAN ODT) 8 MG disintegrating tablet Take 1 tablet (8 mg total) by mouth every 8 (eight) hours as needed for nausea or vomiting. Patient not taking: Reported on 08/19/2015 08/13/15   Zara Council A, PA-C  oxyCODONE-acetaminophen (  PERCOCET) 5-325 MG tablet Take 1 tablet by mouth every 4 (four) hours as needed for moderate pain or severe pain. 08/11/17   Gae Dry, MD  pantoprazole (PROTONIX) 40 MG tablet Take 40 mg by mouth every morning.     [provider]  phenazopyridine (PYRIDIUM) 200 MG tablet Take 1 tablet (200 mg total) by mouth 3 (three) times daily as needed for pain. Patient not taking: Reported on 08/13/2015 08/07/15   Carrie Mew, MD  rOPINIRole (REQUIP) 0.25 MG tablet  Take by mouth as needed.  06/10/17   [provider]  tamsulosin (FLOMAX) 0.4 MG CAPS capsule Take 1 capsule (0.4 mg total) by mouth daily. Patient not taking: Reported on 08/04/2017 08/07/15   Carrie Mew, MD  Vitamins A & D (VITAMIN A & D) ointment Apply 1 application topically as needed for dry skin. Patient not taking: Reported on 07/26/2017 05/05/17   Eula Listen, MD    Allergies Other and Codeine  Family History  Problem Relation Age of Onset  . Diabetes Mother   . Heart failure Mother   . Breast cancer Mother   . Brain cancer Maternal Aunt   . Ovarian cancer Maternal Grandmother   . Rectal cancer Maternal Grandfather   . Hematuria Neg Hx     Social History Social History   Tobacco Use  . Smoking status: Current Every Day Smoker    Packs/day: 0.50    Years: 30.00    Pack years: 15.00    Types: Cigarettes  . Smokeless tobacco: Never Used  Substance Use Topics  . Alcohol use: No  . Drug use: No    Review of Systems Constitutional: No fever/chills Eyes: No visual changes. ENT: No sore throat. Cardiovascular: Denies chest pain. Respiratory: Denies shortness of breath. Gastrointestinal: Positive for abdominal pain.  No nausea, no vomiting.  No diarrhea.  Positive for constipation. Genitourinary: Negative for dysuria. Musculoskeletal: Negative for neck pain.  Negative for back pain. Integumentary: Negative for rash. Neurological: Negative for headaches, focal weakness or numbness.   ____________________________________________   PHYSICAL EXAM:  VITAL SIGNS: ED Triage Vitals  Enc Vitals Group     BP 08/16/17 2344 (!) 123/50     Pulse Rate 08/16/17 2344 82     Resp 08/16/17 2344 20     Temp 08/16/17 2344 98 F (36.7 C)     Temp Source 08/16/17 2344 Oral     SpO2 08/16/17 2344 97 %     Weight 08/16/17 2345 77.1 kg (170 lb)     Height 08/16/17 2345 1.549 m (5\' 1" )     Head Circumference --      Peak Flow --      Pain Score  08/17/17 0400 10     Pain Loc --      Pain Edu? --      Excl. in [place]? --     Constitutional: Alert and oriented. Well appearing and in no acute distress. Eyes: Conjunctivae are normal.  Head: Atraumatic.Marland Kitchen Mouth/Throat: Mucous membranes are moist.  Oropharynx non-erythematous. Neck: No stridor.   Cardiovascular: Normal rate, regular rhythm. Good peripheral circulation. Grossly normal heart sounds. Respiratory: Normal respiratory effort.  No retractions. Lungs CTAB. Gastrointestinal: Soft and nontender. No distention.  Musculoskeletal: No lower extremity tenderness nor edema. No gross deformities of extremities. Neurologic:  Normal speech and language. No gross focal neurologic deficits are appreciated.  Skin:  Skin is warm, dry and intact. No rash noted.  Abdominal laparoscopic wounds clean dry intact  without any signs of infection. Psychiatric: Mood and affect are normal. Speech and behavior are normal.  ____________________________________________   LABS (all labs ordered are listed, but only abnormal results are displayed)  Labs Reviewed  COMPREHENSIVE METABOLIC PANEL - Abnormal; Notable for the following components:      Result Value   Glucose, Bld 115 (*)    All other components within normal limits  CBC  POC OCCULT BLOOD, ED  TYPE AND SCREEN    RADIOLOGY I, Hadar N BROWN, personally viewed and evaluated these images (plain radiographs) as part of my medical decision making, as well as reviewing the written report by the radiologist.  Dg Abdomen 1 View  Result Date: 08/17/2017 CLINICAL DATA:  Constipation EXAM: ABDOMEN - 1 VIEW COMPARISON:  None. FINDINGS: The bowel gas pattern is normal. Large amount of stool in the proximal colon. No radio-opaque calculi or other significant radiographic abnormality are seen. IMPRESSION: Large amount of stool in the proximal colon. No radiographic evidence of small bowel obstruction. Electronically Signed   By: Ulyses Jarred M.D.   On:  08/17/2017 00:36      Procedures   ____________________________________________   INITIAL IMPRESSION / ASSESSMENT AND PLAN / ED COURSE  As part of my medical decision making, I reviewed the following data within the electronic MEDICAL RECORD [id number] year old female presented with history and physical exam consistent with constipation postoperatively.  Patient given enema in the emergency department with stool production.  Patient will be prescribed MiraLAX for home. ____________________________________________  FINAL CLINICAL IMPRESSION(S) / ED DIAGNOSES  Final diagnoses:  Slow transit constipation     MEDICATIONS GIVEN DURING THIS VISIT:  Medications  oxyCODONE-acetaminophen (PERCOCET/ROXICET) 5-325 MG per tablet 1 tablet (not administered)  polyethylene glycol (MIRALAX / GLYCOLAX) packet 17 g (not administered)  polyethylene glycol (MIRALAX / GLYCOLAX) packet (not administered)  mineral oil enema 1 enema (1 enema Rectal Given 08/17/17 0351)  docusate (COLACE) 50 MG/5ML liquid 100 mg (100 mg Oral Given 08/17/17 0357)  magnesium citrate solution 1 Bottle (1 Bottle Oral Given 08/17/17 0352)  docusate sodium (COLACE) 100 MG capsule (100 mg  Given 08/17/17 6269)     ED Discharge Orders    None       Note:  This document was prepared using Dragon voice recognition software and may include unintentional dictation errors.    Gregor Hams, MD 08/17/17 [phone]

## 2017-08-17 NOTE — Telephone Encounter (Signed)
Pt calling today is day 8 of her bowels not moving, was seen in the ER late last night earlier this morning. Had x-ray done and showed that she is backed up. Her incision is starting to hurt her from all the straining. Was given oral meds and soapy meds. She is having severe stomach cramps and lots of sweats. The oral meds is making her sick. Would like to see if Dr Kenton Kingfisher can call her in some meds to help. Would like meds called to [facility] in [place].  Cb# [phone]

## 2017-08-17 NOTE — Telephone Encounter (Signed)
Pt called nurse line stating she called [facility] last night, talked c nurse, and went to ER.  Needs call back ASAP.  [phone]

## 2017-08-17 NOTE — Telephone Encounter (Signed)
Pt needs you to send her in something for her constipation, states oral meds make her sick but the enema was to violent. Pt is in extreme pain . Please advise

## 2017-08-24 ENCOUNTER — Ambulatory Visit (INDEPENDENT_AMBULATORY_CARE_PROVIDER_SITE_OTHER): Payer: Managed Care, Other (non HMO) | Admitting: Obstetrics & Gynecology

## 2017-08-24 ENCOUNTER — Encounter: Payer: Self-pay | Admitting: Obstetrics & Gynecology

## 2017-08-24 VITALS — BP 100/60 | HR 81 | Ht 61.0 in | Wt 175.0 lb

## 2017-08-24 DIAGNOSIS — N939 Abnormal uterine and vaginal bleeding, unspecified: Secondary | ICD-10-CM

## 2017-08-24 DIAGNOSIS — Z1231 Encounter for screening mammogram for malignant neoplasm of breast: Secondary | ICD-10-CM

## 2017-08-24 DIAGNOSIS — N921 Excessive and frequent menstruation with irregular cycle: Secondary | ICD-10-CM

## 2017-08-24 DIAGNOSIS — Z1239 Encounter for other screening for malignant neoplasm of breast: Secondary | ICD-10-CM

## 2017-08-24 DIAGNOSIS — Z9189 Other specified personal risk factors, not elsewhere classified: Secondary | ICD-10-CM | POA: Insufficient documentation

## 2017-08-24 NOTE — Progress Notes (Signed)
  Postoperative Follow-up Patient presents post op from [facility] for abnormal uterine bleeding, 2 weeks ago.  Surgery Images:  Path: DIAGNOSIS:  A. UTERUS WITH CERVIX AND BILATERAL FALLOPIAN TUBES; HYSTERECTOMY WITH  BILATERAL SALPINGECTOMY:  - NABOTHIAN CYSTS.  - PROLIFERATIVE ENDOMETRIUM.  - LEIOMYOMA, 0.6 CM.  - PARATUBAL CYST OF RIGHT FALLOPIAN TUBE.  - LEFT FALLOPIAN TUBE WITHOUT PATHOLOGIC CHANGE.  Subjective: Patient reports marked improvement in her preop symptoms. Eating a regular diet without difficulty. The patient is not having any pain.  Activity: normal activities of daily living. Patient reports vaginal sx's of None  Objective: BP 100/60   Pulse 81   Ht 5\' 1"  (1.549 m)   Wt 175 lb (79.4 kg)   LMP 06/10/2017 (Approximate)   BMI 33.07 kg/m  Physical Exam  Constitutional: She is oriented to person, place, and time. She appears well-developed and well-nourished. No distress.  Cardiovascular: Normal rate.  Pulmonary/Chest: Effort normal.  Abdominal: Soft. She exhibits no distension. There is no tenderness.  Incision Healing Well   Musculoskeletal: Normal range of motion.  Neurological: She is alert and oriented to person, place, and time. No cranial nerve deficit.  Skin: Skin is warm and dry.  Psychiatric: She has a normal mood and affect.    Assessment: s/p :  total laparoscopic hysterectomy with bilateral salpingectomy stable  Plan: Patient has done well after surgery with no apparent complications.  I have discussed the post-operative course to date, and the expected progress moving forward.  The patient understands what complications to be concerned about.  I will see the patient in routine follow up, or sooner if needed.    Activity plan: No heavy lifting. Pelvic rest  Discussed genetic testing results and their implications.  GI referral for colon cancer risk and surveillance strategies.  Needs  MMG  Hoyt Koch 08/24/2017, 3:00 PM

## 2017-08-24 NOTE — Patient Instructions (Addendum)
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 While exact breakpoints of this deletion cannot  be determined by this assay, deletion of exon 10 has been  previously reported as a pathogenic variant in the  literature. Therefore, this variant has been classified as  associated with an increased risk for a diagnosis of Lynch  syndrome associated cancers. 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 Germline PMS2 mutations have been associated  with Lynch syndrome, which is characterized by an increased  risk for early-onset colorectal cancer and other tumors  including the GI, urological, female reproductive tracts,  CNS, and skin. Turcot syndrome can also be caused by  mutations in the PMS2 gene.  Clinical Significance: High Cancer Risk  NCCN Guidelines provide clinical management recommendations  for PMS2. The most current guidelines may be found on  [employer].fr. In addition to this individual being at increased  risk, other family members may also be at risk. There is a  50% (1 in 2) chance of a first-degree relative having this  mutation. Please call [phone] to speak to a [place] Counselor to discuss if targeted analysis for other  family members is appropriate.  This result is associated with the following cancer risks:  Lifetime High Risk      15-20% Colon, 15% Endometrial  *See table below for additional risk information  No additional sequence or copy number variants of clinical  significance were detected.   Recommendations Comment   Comment: Genetic counseling is recommended to discuss the clinical  implications of this result. Genetic counselors are  available for health care providers to discuss this result   further at [phone]. To refer your patient for  genetic counseling through [facility], please call  the scheduling line at [phone].  CANCER TYPE   CANCER RISK   RISK FOR    RELATED TO                  GENERAL                  POPULATION  Colon  To age 8    15-20%      4.5%      PMS2  Endometrial  To age 67    15%       2.8%      PMS2  LIST OF ALL GENES IN PANEL  ATM  MLH1  PTEN  BRCA2  [place]anti  RAD51D    FAM175A  NBN  MSH2  TP53  BRIP1  PALB2  STK11  NF1  MSH6  BARD1 CHEK2  RAD50  MRE11A  CDH1  PMS2  BRCA1 EPCAM  RAD51C MUTYH

## 2017-08-30 ENCOUNTER — Telehealth: Payer: Self-pay

## 2017-08-30 NOTE — Telephone Encounter (Signed)
FMLA/DISABILITY form for [employer] filled out, signature obtained, and given to [place] for processing. 

## 2017-09-01 ENCOUNTER — Other Ambulatory Visit: Payer: Self-pay

## 2017-09-01 DIAGNOSIS — Z1211 Encounter for screening for malignant neoplasm of colon: Secondary | ICD-10-CM

## 2017-09-02 ENCOUNTER — Telehealth: Payer: Self-pay

## 2017-09-02 NOTE — Telephone Encounter (Signed)
Right incision is completely red and had some drainage very sore feels warm, Do you want to see her today?

## 2017-09-02 NOTE — Telephone Encounter (Signed)
Pt was here and left and cant come back today, states she will be back monday

## 2017-09-02 NOTE — Telephone Encounter (Signed)
yes

## 2017-09-05 ENCOUNTER — Ambulatory Visit: Payer: Managed Care, Other (non HMO) | Admitting: Obstetrics & Gynecology

## 2017-09-15 ENCOUNTER — Telehealth: Payer: Self-pay

## 2017-09-15 NOTE — Telephone Encounter (Signed)
Dr. Stann Mainland faxed blood thinner request back  09/01/17.  Advised "Patient may stop ASA 7 days prior to procedure.  Patient may restart blood thinner 3-5 days after procedure".  Patient stated she no longer takes aspirin.  Thanks [employer]

## 2017-09-21 ENCOUNTER — Ambulatory Visit (INDEPENDENT_AMBULATORY_CARE_PROVIDER_SITE_OTHER): Payer: Managed Care, Other (non HMO) | Admitting: Obstetrics & Gynecology

## 2017-09-21 ENCOUNTER — Encounter: Payer: Self-pay | Admitting: Obstetrics & Gynecology

## 2017-09-21 VITALS — BP 110/70 | HR 80 | Ht 61.0 in | Wt 180.0 lb

## 2017-09-21 DIAGNOSIS — N939 Abnormal uterine and vaginal bleeding, unspecified: Secondary | ICD-10-CM

## 2017-09-21 NOTE — Progress Notes (Signed)
  Postoperative Follow-up Patient presents post op from [facility] for abnormal uterine bleeding, 6 weeks ago.  Subjective: Patient reports marked improvement in her preop symptoms. Eating a regular diet without difficulty. The patient is not having any pain.  Activity: normal activities of daily living. Patient reports vaginal sx's of None  Objective: BP 110/70   Pulse 80   Ht 5\' 1"  (1.549 m)   Wt 180 lb (81.6 kg)   LMP 06/10/2017 (Approximate)   BMI 34.01 kg/m  Physical Exam  Constitutional: She is oriented to person, place, and time. She appears well-developed and well-nourished. No distress.  Genitourinary: Rectum normal and vagina normal. Pelvic exam was performed with patient supine. There is no rash, tenderness or lesion on the right labia. There is no rash, tenderness or lesion on the left labia. No erythema or bleeding in the vagina. Right adnexum does not display mass and does not display tenderness. Left adnexum does not display mass and does not display tenderness.  Genitourinary Comments: Cervix and uterus absent. Vaginal cuff healing well.  Cardiovascular: Normal rate.  Pulmonary/Chest: Effort normal.  Abdominal: Soft. She exhibits no distension. There is no tenderness.  Incision healing well.  Musculoskeletal: Normal range of motion.  Neurological: She is alert and oriented to person, place, and time. No cranial nerve deficit.  Skin: Skin is warm and dry.  Psychiatric: She has a normal mood and affect.   Assessment: s/p :  total laparoscopic hysterectomy with bilateral salpingectomy stable  Plan: Patient has done well after surgery with no apparent complications.  I have discussed the post-operative course to date, and the expected progress moving forward.  The patient understands what complications to be concerned about.  I will see the patient in routine follow up, or sooner if needed.    Activity plan: No restriction.  MMG and Colonoscopy soon  Hoyt Koch 09/21/2017, 2:42 PM

## 2017-09-21 NOTE — Patient Instructions (Signed)
Mammogram every year    Call [phone] to schedule at [place]  

## 2017-10-18 ENCOUNTER — Ambulatory Visit
Admission: RE | Admit: 2017-10-18 | Discharge: 2017-10-18 | Disposition: A | Discharge status: Home / Self Care | Payer: Managed Care, Other (non HMO) | Source: Ambulatory Visit | Attending: Obstetrics & Gynecology | Admitting: Obstetrics & Gynecology

## 2017-10-18 DIAGNOSIS — Z1231 Encounter for screening mammogram for malignant neoplasm of breast: Secondary | ICD-10-CM | POA: Diagnosis present

## 2017-10-18 DIAGNOSIS — Z1239 Encounter for other screening for malignant neoplasm of breast: Secondary | ICD-10-CM

## 2017-10-19 ENCOUNTER — Encounter: Payer: Self-pay | Admitting: Obstetrics & Gynecology

## 2017-10-24 ENCOUNTER — Telehealth: Payer: Self-pay | Admitting: Gastroenterology

## 2017-10-24 NOTE — Telephone Encounter (Signed)
Patient called to cancel her colonoscopy. She wants to do the cologuard. Per Sharyn Lull, she needs to call per PCP to get them to order it. I called [facility] to let them know

## 2017-10-25 ENCOUNTER — Encounter: Admission: RE | Payer: Self-pay | Source: Ambulatory Visit

## 2017-10-25 ENCOUNTER — Ambulatory Visit
Admission: RE | Admit: 2017-10-25 | Payer: Managed Care, Other (non HMO) | Source: Ambulatory Visit | Admitting: Gastroenterology

## 2017-10-25 SURGERY — COLONOSCOPY WITH PROPOFOL
Anesthesia: General

## 2017-12-04 ENCOUNTER — Ambulatory Visit
Admission: EM | Admit: 2017-12-04 | Discharge: 2017-12-04 | Disposition: A | Discharge status: Home / Self Care | Payer: Managed Care, Other (non HMO) | Attending: Family Medicine | Admitting: Family Medicine

## 2017-12-04 ENCOUNTER — Encounter: Payer: Self-pay | Admitting: Gynecology

## 2017-12-04 ENCOUNTER — Other Ambulatory Visit: Payer: Self-pay

## 2017-12-04 DIAGNOSIS — J209 Acute bronchitis, unspecified: Secondary | ICD-10-CM

## 2017-12-04 MED ORDER — HYDROCOD POLST-CPM POLST ER 10-8 MG/5ML PO SUER: 5.0000 mL | Freq: Two times a day (BID) | ORAL | 0 refills | Status: DC | PRN

## 2017-12-04 MED ORDER — DOXYCYCLINE HYCLATE 100 MG PO CAPS: 100.0000 mg | ORAL_CAPSULE | Freq: Two times a day (BID) | ORAL | 0 refills | Status: DC

## 2017-12-04 MED ORDER — PREDNISONE 50 MG PO TABS: ORAL_TABLET | ORAL | 0 refills | Status: DC

## 2017-12-04 MED ORDER — ALBUTEROL SULFATE HFA 108 (90 BASE) MCG/ACT IN AERS: 1.0000 | INHALATION_SPRAY | Freq: Four times a day (QID) | RESPIRATORY_TRACT | 0 refills | Status: AC | PRN

## 2017-12-04 NOTE — ED Provider Notes (Signed)
[facility]  CSN: [id number] Arrival date & time: 12/04/17  0813  History   Chief Complaint Chief Complaint  Patient presents with  . Cough   HPI  47 year old female presents with cough.  Patient reports a severe cough for the past 4 days.  Dry cough.  Associated shortness of breath.  She states that her cough is "very deep".  No associated fever.  She is a smoker.  Cough is severe and worsening.  She is very concerned.  No known exacerbating or relieving factors.  No other associated symptoms.  No other complaints.  Past Medical History:  Diagnosis Date  . A-fib ([place])   . Abnormal uterine bleeding 05/15/2014   Overview:  [place]: shows 1. 97 cm, Lt cyst .88   . Acute sinus infection 07/2017  . Adnexal pain 08/13/2015  . Anxiety    NO MEDS  . Dysrhythmia    PALPITATIONS  . Essential (primary) hypertension 07/31/2015  . GERD (gastroesophageal reflux disease)   . History of kidney stones   . Hyperlipidemia   . 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 Myocardial bridge    Patient Active Problem List   Diagnosis Date Noted  . Colon cancer high risk 08/24/2017  . Menometrorrhagia 07/07/2017  . Nephrolithiasis 08/14/2015  . Gross hematuria 08/14/2015  . Adnexal pain 08/13/2015  . Essential (primary) hypertension 07/31/2015  . Abnormal uterine bleeding 05/15/2014  . 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 ABDOMINAL HYSTERECTOMY    . CARDIAC CATHETERIZATION    . CYSTOSCOPY  08/11/2017   Procedure: CYSTOSCOPY;  Surgeon: Gae Dry, MD;  Location: [facility] OR[facility];  Service: Gynecology;;  . ESOPHAGOGASTRODUODENOSCOPY (EGD) WITH ESOPHAGEAL DILATION    . LAPAROSCOPIC HYSTERECTOMY Bilateral 08/11/2017   Procedure: HYSTERECTOMY TOTAL LAPAROSCOPIC BILATERAL SALPINGECTOMY;  Surgeon: Gae Dry, MD;  Location: [facility] OR[facility];  Service: Gynecology;  Laterality: Bilateral;  . TUBAL LIGATION       OB History    Gravida  4   Para  4   Term  4   Preterm      AB      Living  4     SAB      TAB      Ectopic      Multiple      Live Births  4          Home Medications    Prior to Admission medications   Medication Sig Start Date End Date Taking? Authorizing Provider  acetaminophen (TYLENOL) 325 MG tablet Take 650 mg by mouth every 6 (six) hours as needed for moderate pain or headache.   Yes [provider]  ibuprofen (ADVIL,MOTRIN) 200 MG tablet Take 400 mg by mouth every 6 (six) hours as needed for headache or moderate pain.   Yes [provider]  rOPINIRole (REQUIP) 0.25 MG tablet Take by mouth as needed.  06/10/17  Yes [provider]  albuterol (PROVENTIL HFA;VENTOLIN HFA) 108 (90 Base) MCG/ACT inhaler Inhale 1-2 puffs into the lungs every 6 (six) hours as needed for wheezing or shortness of breath. 12/04/17   Coral Spikes, DO  chlorpheniramine-HYDROcodone (TUSSIONEX PENNKINETIC ER) 10-8 MG/5ML SUER Take 5 mLs by mouth every 12 (twelve) hours as needed. 12/04/17   Coral Spikes, DO  doxycycline (VIBRAMYCIN) 100 MG capsule Take 1 capsule (100 mg total) by mouth 2 (two) times daily. 12/04/17  Camara Rosander G, DO  predniSONE (DELTASONE) 50 MG tablet 1 tablet daily x 5 days. 12/04/17   Coral Spikes, DO    Family History Family History  Problem Relation Age of Onset  . Diabetes Mother   . Heart failure Mother   . Breast cancer Mother   . Brain cancer Maternal Aunt   . Ovarian cancer Maternal Grandmother   . Rectal cancer Maternal Grandfather   . Hematuria Neg Hx     Social History Social History   Tobacco Use  . Smoking status: Current Every Day Smoker    Packs/day: 0.50    Years: 30.00    Pack years: 15.00    Types: Cigarettes  . Smokeless tobacco: Never Used  Substance Use Topics  . Alcohol use: No  . Drug use: No     Allergies   Other and Codeine   Review of Systems Review of Systems  Constitutional: Negative for  chills and fever.  Respiratory: Positive for cough and shortness of breath.    Physical Exam Triage Vital Signs ED Triage Vitals  Enc Vitals Group     BP 12/04/17 0827 (!) 105/50     Pulse Rate 12/04/17 0827 92     Resp 12/04/17 0827 16     Temp 12/04/17 0827 98.4 F (36.9 C)     Temp Source 12/04/17 0827 Oral     SpO2 12/04/17 0827 96 %     Weight 12/04/17 0826 175 lb (79.4 kg)     Height 12/04/17 0826 5\' 1"  (1.549 m)     Head Circumference --      Peak Flow --      Pain Score 12/04/17 0826 7     Pain Loc --      Pain Edu? --      Excl. in [place]? --    No data found.  Updated Vital Signs BP (!) 105/50 (BP Location: Left Arm)   Pulse 92   Temp 98.4 F (36.9 C) (Oral)   Resp 16   Ht 5\' 1"  (1.549 m)   Wt 175 lb (79.4 kg)   LMP 06/10/2017 (Approximate)   SpO2 96%   BMI 33.07 kg/m   Visual Acuity Right Eye Distance:   Left Eye Distance:   Bilateral Distance:    Right Eye Near:   Left Eye Near:    Bilateral Near:     Physical Exam  Constitutional: She is oriented to person, place, and time. She appears well-developed. No distress.  HENT:  Head: Normocephalic and atraumatic.  Mouth/Throat: Oropharynx is clear and moist.  Normal TM's.  Eyes: Conjunctivae are normal. Right eye exhibits no discharge. Left eye exhibits no discharge.  Cardiovascular: Normal rate and regular rhythm.  Pulmonary/Chest: Effort normal and breath sounds normal. She has no wheezes. She has no rales.  Neurological: She is alert and oriented to person, place, and time.  Psychiatric: She has a normal mood and affect. Her behavior is normal.  Nursing note and vitals reviewed.   UC Treatments / Results  Labs (all labs ordered are listed, but only abnormal results are displayed) Labs Reviewed - No data to display  EKG None Radiology No results found.  Procedures Procedures (including critical care time)  Medications Ordered in UC Medications - No data to display   Initial Impression  / Assessment and Plan / UC Course  I have reviewed the triage vital signs and the nursing notes.  Pertinent labs & imaging results that were available  during my care of the patient were reviewed by me and considered in my medical decision making (see chart for details).     47 year old female presents with acute bronchitis.  Treating as below.  Final Clinical Impressions(s) / UC Diagnoses   Final diagnoses:  Acute bronchitis, unspecified organism    ED Discharge Orders        Ordered    doxycycline (VIBRAMYCIN) 100 MG capsule  2 times daily     12/04/17 0843    predniSONE (DELTASONE) 50 MG tablet     12/04/17 0843    chlorpheniramine-HYDROcodone (TUSSIONEX PENNKINETIC ER) 10-8 MG/5ML SUER  Every 12 hours PRN     12/04/17 0843    albuterol (PROVENTIL HFA;VENTOLIN HFA) 108 (90 Base) MCG/ACT inhaler  Every 6 hours PRN     12/04/17 0843      Controlled Substance Prescriptions [place] Controlled Substance Registry consulted? Not Applicable   Coral Spikes, [place] 12/04/17 [phone]

## 2017-12-04 NOTE — ED Triage Notes (Signed)
Patient c/o dry cough  X 4 days

## 2017-12-06 ENCOUNTER — Other Ambulatory Visit: Payer: Self-pay | Admitting: Family Medicine

## 2017-12-06 DIAGNOSIS — R0602 Shortness of breath: Secondary | ICD-10-CM

## 2017-12-07 ENCOUNTER — Ambulatory Visit
Admission: RE | Admit: 2017-12-07 | Discharge: 2017-12-07 | Disposition: A | Discharge status: Home / Self Care | Payer: Managed Care, Other (non HMO) | Source: Ambulatory Visit | Attending: Family Medicine | Admitting: Family Medicine

## 2017-12-07 DIAGNOSIS — R0602 Shortness of breath: Secondary | ICD-10-CM | POA: Diagnosis not present

## 2017-12-07 DIAGNOSIS — R05 Cough: Secondary | ICD-10-CM | POA: Insufficient documentation

## 2017-12-07 DIAGNOSIS — J8410 Pulmonary fibrosis, unspecified: Secondary | ICD-10-CM | POA: Insufficient documentation

## 2018-02-03 ENCOUNTER — Ambulatory Visit: Payer: Managed Care, Other (non HMO) | Admitting: Podiatry

## 2018-03-10 ENCOUNTER — Ambulatory Visit (INDEPENDENT_AMBULATORY_CARE_PROVIDER_SITE_OTHER): Payer: Managed Care, Other (non HMO)

## 2018-03-10 ENCOUNTER — Ambulatory Visit: Payer: Managed Care, Other (non HMO) | Admitting: Podiatry

## 2018-03-10 ENCOUNTER — Encounter: Payer: Self-pay | Admitting: Podiatry

## 2018-03-10 DIAGNOSIS — M722 Plantar fascial fibromatosis: Secondary | ICD-10-CM

## 2018-03-10 DIAGNOSIS — E669 Obesity, unspecified: Secondary | ICD-10-CM | POA: Insufficient documentation

## 2018-03-10 MED ORDER — MELOXICAM 15 MG PO TABS: 15.0000 mg | ORAL_TABLET | Freq: Every day | ORAL | 1 refills | Status: DC

## 2018-03-10 MED ORDER — PRAMIPEXOLE DIHYDROCHLORIDE 0.125 MG PO TABS: 0.1250 mg | ORAL_TABLET | Freq: Every day | ORAL | 0 refills | Status: DC

## 2018-03-10 MED ORDER — MELOXICAM 15 MG PO TABS: 15.0000 mg | ORAL_TABLET | Freq: Every day | ORAL | 1 refills | Status: AC

## 2018-03-10 NOTE — Patient Instructions (Signed)
For instructions on how to put on your Plantar Fascial Brace, please visit [email or web address]   Plantar Fasciitis (Heel Spur Syndrome) with Rehab The plantar fascia is a fibrous, ligament-like, soft-tissue structure that spans the bottom of the foot. Plantar fasciitis is a condition that causes pain in the foot due to inflammation of the tissue. SYMPTOMS   Pain and tenderness on the underneath side of the foot.  Pain that worsens with standing or walking. CAUSES  Plantar fasciitis is caused by irritation and injury to the plantar fascia on the underneath side of the foot. Common mechanisms of injury include:  Direct trauma to bottom of the foot.  Damage to a small nerve that runs under the foot where the main fascia attaches to the heel bone.  Stress placed on the plantar fascia due to bone spurs. RISK INCREASES WITH:   Activities that place stress on the plantar fascia (running, jumping, pivoting, or cutting).  Poor strength and flexibility.  Improperly fitted shoes.  Tight calf muscles.  Flat feet.  Failure to warm-up properly before activity.  Obesity. PREVENTION  Warm up and stretch properly before activity.  Allow for adequate recovery between workouts.  Maintain physical fitness:  Strength, flexibility, and endurance.  Cardiovascular fitness.  Maintain a health body weight.  Avoid stress on the plantar fascia.  Wear properly fitted shoes, including arch supports for individuals who have flat feet.  PROGNOSIS  If treated properly, then the symptoms of plantar fasciitis usually resolve without surgery. However, occasionally surgery is necessary.  RELATED COMPLICATIONS   Recurrent symptoms that may result in a chronic condition.  Problems of the lower back that are caused by compensating for the injury, such as limping.  Pain or weakness of the foot during push-off following surgery.  Chronic inflammation, scarring, and partial or complete  fascia tear, occurring more often from repeated injections.  TREATMENT  Treatment initially involves the use of ice and medication to help reduce pain and inflammation. The use of strengthening and stretching exercises may help reduce pain with activity, especially stretches of the Achilles tendon. These exercises may be performed at home or with a therapist. Your caregiver may recommend that you use heel cups of arch supports to help reduce stress on the plantar fascia. Occasionally, corticosteroid injections are given to reduce inflammation. If symptoms persist for greater than 6 months despite non-surgical (conservative), then surgery may be recommended.   MEDICATION   If pain medication is necessary, then nonsteroidal anti-inflammatory medications, such as aspirin and ibuprofen, or other minor pain relievers, such as acetaminophen, are often recommended.  Do not take pain medication within 7 days before surgery.  Prescription pain relievers may be given if deemed necessary by your caregiver. Use only as directed and only as much as you need.  Corticosteroid injections may be given by your caregiver. These injections should be reserved for the most serious cases, because they may only be given a certain number of times.  HEAT AND COLD  Cold treatment (icing) relieves pain and reduces inflammation. Cold treatment should be applied for 10 to 15 minutes every 2 to 3 hours for inflammation and pain and immediately after any activity that aggravates your symptoms. Use ice packs or massage the area with a piece of ice (ice massage).  Heat treatment may be used prior to performing the stretching and strengthening activities prescribed by your caregiver, physical therapist, or athletic trainer. Use a heat pack or soak the injury in warm water.  SEEK IMMEDIATE MEDICAL   CARE IF:  Treatment seems to offer no benefit, or the condition worsens.  Any medications produce adverse side effects.   EXERCISES- RANGE OF MOTION (ROM) AND STRETCHING EXERCISES - Plantar Fasciitis (Heel Spur Syndrome) These exercises may help you when beginning to rehabilitate your injury. Your symptoms may resolve with or without further involvement from your physician, physical therapist or athletic trainer. While completing these exercises, remember:   Restoring tissue flexibility helps normal motion to return to the joints. This allows healthier, less painful movement and activity.  An effective stretch should be held for at least 30 seconds.  A stretch should never be painful. You should only feel a gentle lengthening or release in the stretched tissue.  RANGE OF MOTION - Toe Extension, Flexion  Sit with your right / left leg crossed over your opposite knee.  Grasp your toes and gently pull them back toward the top of your foot. You should feel a stretch on the bottom of your toes and/or foot.  Hold this stretch for 10 seconds.  Now, gently pull your toes toward the bottom of your foot. You should feel a stretch on the top of your toes and or foot.  Hold this stretch for 10 seconds. Repeat  times. Complete this stretch 3 times per day.   RANGE OF MOTION - Ankle Dorsiflexion, Active Assisted  Remove shoes and sit on a chair that is preferably not on a carpeted surface.  Place right / left foot under knee. Extend your opposite leg for support.  Keeping your heel down, slide your right / left foot back toward the chair until you feel a stretch at your ankle or calf. If you do not feel a stretch, slide your bottom forward to the edge of the chair, while still keeping your heel down.  Hold this stretch for 10 seconds. Repeat 3 times. Complete this stretch 2 times per day.   STRETCH  Gastroc, Standing  Place hands on wall.  Extend right / left leg, keeping the front knee somewhat bent.  Slightly point your toes inward on your back foot.  Keeping your right / left heel on the floor and your  knee straight, shift your weight toward the wall, not allowing your back to arch.  You should feel a gentle stretch in the right / left calf. Hold this position for 10 seconds. Repeat 3 times. Complete this stretch 2 times per day.  STRETCH  Soleus, Standing  Place hands on wall.  Extend right / left leg, keeping the other knee somewhat bent.  Slightly point your toes inward on your back foot.  Keep your right / left heel on the floor, bend your back knee, and slightly shift your weight over the back leg so that you feel a gentle stretch deep in your back calf.  Hold this position for 10 seconds. Repeat 3 times. Complete this stretch 2 times per day.  STRETCH  Gastrocsoleus, Standing  Note: This exercise can place a lot of stress on your foot and ankle. Please complete this exercise only if specifically instructed by your caregiver.   Place the ball of your right / left foot on a step, keeping your other foot firmly on the same step.  Hold on to the wall or a rail for balance.  Slowly lift your other foot, allowing your body weight to press your heel down over the edge of the step.  You should feel a stretch in your right / left calf.  Hold this   position for 10 seconds.  Repeat this exercise with a slight bend in your right / left knee. Repeat 3 times. Complete this stretch 2 times per day.   STRENGTHENING EXERCISES - Plantar Fasciitis (Heel Spur Syndrome)  These exercises may help you when beginning to rehabilitate your injury. They may resolve your symptoms with or without further involvement from your physician, physical therapist or athletic trainer. While completing these exercises, remember:   Muscles can gain both the endurance and the strength needed for everyday activities through controlled exercises.  Complete these exercises as instructed by your physician, physical therapist or athletic trainer. Progress the resistance and repetitions only as guided.  STRENGTH -  Towel Curls  Sit in a chair positioned on a non-carpeted surface.  Place your foot on a towel, keeping your heel on the floor.  Pull the towel toward your heel by only curling your toes. Keep your heel on the floor. Repeat 3 times. Complete this exercise 2 times per day.  STRENGTH - Ankle Inversion  Secure one end of a rubber exercise band/tubing to a fixed object (table, pole). Loop the other end around your foot just before your toes.  Place your fists between your knees. This will focus your strengthening at your ankle.  Slowly, pull your big toe up and in, making sure the band/tubing is positioned to resist the entire motion.  Hold this position for 10 seconds.  Have your muscles resist the band/tubing as it slowly pulls your foot back to the starting position. Repeat 3 times. Complete this exercises 2 times per day.  Document Released: 07/26/2005 Document Revised: 10/18/2011 Document Reviewed: 11/07/2008 Exit[employer] Patient Information 2014 [employer]. 

## 2018-03-14 NOTE — Progress Notes (Signed)
   Subjective: 47 year old female presenting today as a new patient with a chief complaint of constant throbbing pain that feels like pins and needles of bilateral heels that began several years ago. She states the right is worse than the left. She reports associated swelling and states the pain radiates into her arches. The pain is worse when she first gets out of the bed in the morning. Walking and standing also increases the pain. She has been taking Ibuprofen, stretching and using heat therapy for treatment. Patient is here for further evaluation and treatment.   Past Medical History:  Diagnosis Date  . A-fib ([place])   . Abnormal uterine bleeding 05/15/2014   Overview:  [place]: shows 1. 97 cm, Lt cyst .88   . Acute sinus infection 07/2017  . Adnexal pain 08/13/2015  . Anxiety    NO MEDS  . Dysrhythmia    PALPITATIONS  . Essential (primary) hypertension 07/31/2015  . GERD (gastroesophageal reflux disease)   . History of kidney stones   . Hyperlipidemia   . 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 Myocardial bridge      Objective: Physical Exam General: The patient is alert and oriented x3 in no acute distress.  Dermatology: Skin is warm, dry and supple bilateral lower extremities. Negative for open lesions or macerations bilateral.   Vascular: Dorsalis Pedis and Posterior Tibial pulses palpable bilateral.  Capillary fill time is immediate to all digits.  Neurological: Epicritic and protective threshold intact bilateral.   Musculoskeletal: Tenderness to palpation to the plantar aspect of the bilateral heels along the plantar fascia. All other joints range of motion within normal limits bilateral. Strength 5/5 in all groups bilateral.   Radiographic exam: Normal osseous mineralization. Joint spaces preserved. No fracture/dislocation/boney destruction. No other soft tissue abnormalities or radiopaque foreign bodies.   Assessment: 1. plantar fasciitis  bilateral feet 2. RLS BLE  Plan of Care:  1. Patient evaluated. Xrays reviewed.   2. Injection of 0.5cc Celestone soluspan injected into the bilateral heels.  3. Prescription for Meloxicam provided to patient.  4. Plantar fascial brace dispensed.  5. Prescription for Mirapex 0.25 mg QHS provided to patient.  6. Return to clinic in 4 weeks.   Works at [employer].     Edrick Kins, DPM [facility]  Dr. Edrick Kins, DPM    [address], [place] [address]                Office [phone]  Fax [phone]

## 2018-04-07 ENCOUNTER — Encounter: Payer: Managed Care, Other (non HMO) | Admitting: Podiatry

## 2018-04-14 NOTE — Progress Notes (Signed)
This encounter was created in error - please disregard.

## 2019-01-08 IMAGING — CR DG ABDOMEN 1V
1 series · 1 of 1 positions shown · non-contrast
Comparison: None.

CLINICAL DATA: Constipation

EXAM:
ABDOMEN - 1 VIEW

[abdomen kub]
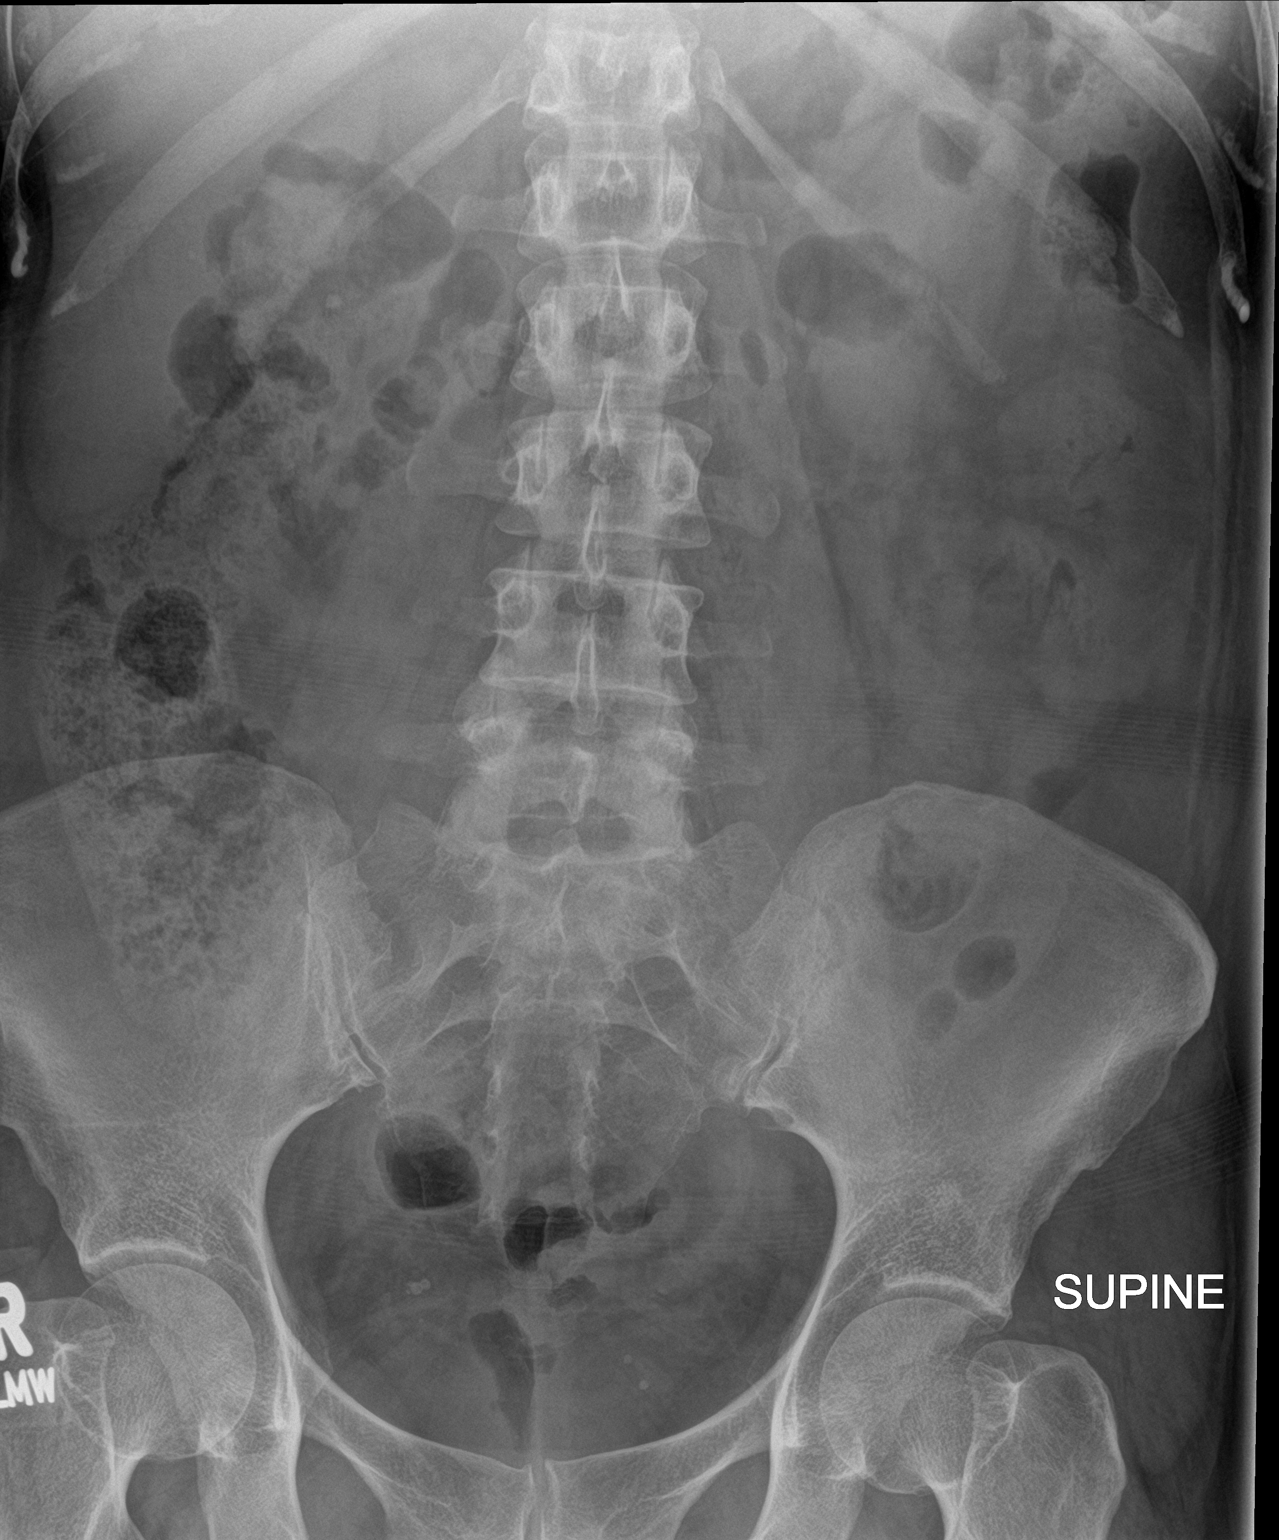

[1 of 1 positions shown; findings below may reference images not displayed]

FINDINGS: The bowel gas pattern is normal. Large amount of stool in the
proximal colon. No radio-opaque calculi or other significant
radiographic abnormality are seen.
IMPRESSION: Large amount of stool in the proximal colon. No radiographic
evidence of small bowel obstruction.

## 2019-11-29 ENCOUNTER — Other Ambulatory Visit (HOSPITAL_COMMUNITY): Payer: Self-pay | Admitting: General Practice

## 2019-11-29 DIAGNOSIS — Z1231 Encounter for screening mammogram for malignant neoplasm of breast: Secondary | ICD-10-CM

## 2019-12-21 ENCOUNTER — Ambulatory Visit (HOSPITAL_COMMUNITY): Admission: RE | Admit: 2019-12-21 | Payer: Self-pay | Source: Ambulatory Visit

## 2020-10-24 ENCOUNTER — Encounter: Payer: Medicaid Other | Admitting: Obstetrics & Gynecology

## 2020-11-19 ENCOUNTER — Encounter: Payer: Medicaid Other | Admitting: Obstetrics & Gynecology

## 2020-12-15 ENCOUNTER — Other Ambulatory Visit (HOSPITAL_COMMUNITY)
Admission: RE | Admit: 2020-12-15 | Discharge: 2020-12-15 | Disposition: A | Discharge status: Home / Self Care | Payer: Medicaid Other | Source: Ambulatory Visit | Attending: Obstetrics & Gynecology | Admitting: Obstetrics & Gynecology

## 2020-12-15 ENCOUNTER — Ambulatory Visit (INDEPENDENT_AMBULATORY_CARE_PROVIDER_SITE_OTHER): Payer: Medicaid Other | Admitting: Obstetrics & Gynecology

## 2020-12-15 ENCOUNTER — Encounter: Payer: Self-pay | Admitting: Obstetrics & Gynecology

## 2020-12-15 ENCOUNTER — Other Ambulatory Visit: Payer: Self-pay

## 2020-12-15 VITALS — BP 120/80 | Ht 61.0 in | Wt 162.0 lb

## 2020-12-15 DIAGNOSIS — Z1231 Encounter for screening mammogram for malignant neoplasm of breast: Secondary | ICD-10-CM | POA: Diagnosis not present

## 2020-12-15 DIAGNOSIS — Z0000 Encounter for general adult medical examination without abnormal findings: Secondary | ICD-10-CM

## 2020-12-15 DIAGNOSIS — Z1211 Encounter for screening for malignant neoplasm of colon: Secondary | ICD-10-CM

## 2020-12-15 DIAGNOSIS — R102 Pelvic and perineal pain: Secondary | ICD-10-CM

## 2020-12-15 DIAGNOSIS — Z01419 Encounter for gynecological examination (general) (routine) without abnormal findings: Secondary | ICD-10-CM | POA: Diagnosis not present

## 2020-12-15 DIAGNOSIS — Z124 Encounter for screening for malignant neoplasm of cervix: Secondary | ICD-10-CM | POA: Diagnosis not present

## 2020-12-15 NOTE — Patient Instructions (Addendum)
PAP every 5 years Mammogram every year    Call [phone] to schedule at [facility] Colonoscopy every 10 years, plan soon    (someone will call you) Labs yearly (with PCP)  Thank you for choosing [facility]. As part of our ongoing efforts to improve patient experience, we would appreciate your feedback. Please fill out the short survey that you will receive by mail or MyChart. Your opinion is important to [place]! - Dr. Kenton Kingfisher   Transvaginal Ultrasound A transvaginal ultrasound, also called an endovaginal ultrasound, is a test that uses sound waves to take pictures of the female genital tract. The pictures are taken with a device, called a transducer, that is placed in the vagina. This test may be done to:  Check for problems with your pregnancy.  Check your developing baby.  Check for anything abnormal in the uterus or ovaries.  Find out why you have pelvic pain or bleeding. Tell a health care provider about:  Any allergies you have.  All medicines you are taking, including vitamins, herbs, eye drops, creams, and over-the-counter medicines.  Any blood disorders you have.  Any surgeries you have had.  Any medical conditions you have.  Whether you are pregnant or may be pregnant.  Whether you are having your menstrual period. What are the risks? This is a safe procedure. There are no known risks or complications of having this test. What happens before the procedure? This procedure needs to be done when your bladder is empty. Follow your health care provider's instructions about drinking fluids and emptying your bladder before the test. What happens during the procedure?  You will empty your bladder before the procedure.  You will undress from the waist down.  You will lie down on an exam table, with your knees bent and your feet in foot holders.  A health care provider will cover the transducer with a sterile cover.  A gel will be put on the transducer. The gel helps  transmit the sound waves and prevents irritation of your vagina.  The technician will insert the transducer into your vagina to get images. These will be displayed on a monitor that looks like a small television screen.  The transducer will be removed when the procedure is complete. The procedure may vary among health care providers and hospitals.   What happens after the procedure?  It is up to you to get the results of your procedure. Ask your health care provider, or the department that is doing the procedure, when your results will be ready.  Keep all follow-up visits as told by your health care provider. This is important. Summary  A transvaginal ultrasound, also called an endovaginal ultrasound, is a test that uses sound waves to take pictures of the female genital tract.  This is a safe procedure. There are no known risks associated with this test.  The procedure needs to be done when your bladder is empty. Follow your health care provider's instructions about drinking fluids and emptying your bladder before the test.  During the procedure, you will undress from the waist down and lie down on an exam table. A technician will insert a transducer into your vagina to obtain images.  Ask your health care provider, or the department that is doing the procedure, when your results will be ready. This information is not intended to replace advice given to you by your health care provider. Make sure you discuss any questions you have with your health care provider. Document Revised:  03/08/2018 Document Reviewed: 03/08/2018 Elsevier Patient Education  [place].

## 2020-12-15 NOTE — Progress Notes (Signed)
HPI:      Nicole Horton is a 50 y.o. [id number] who LMP was in the past, she presents today for her annual examination.  The patient has complaints today of several month h/o new pain in her lower abdomen, it is intermittent but monthly (not precisely cyclical, more random) and is intense and severe pain, mostly midline, no radiation, no modifiers or other associated sx's.  No GI or GU sx's. Has h/o recurrurent UTI in past but none this past 12 mos.  The patient is occasionally sexually active. Herlast pap: was normal and last mammogram: was normal.  The patient does perform self breast exams.  There is no notable family history of breast or ovarian cancer in her family. The patient is not taking hormone replacement therapy. Patient denies post-menopausal vaginal bleeding.   The patient has regular exercise: yes. The patient denies current symptoms of depression.    GYN Hx: Last Colonoscopy:not yet  Prior [facility] BS 2019  PMHx: Past Medical History:  Diagnosis Date  . A-fib ([place])   . Abnormal uterine bleeding 05/15/2014   Overview:  [place]: shows 1. 97 cm, Lt cyst .88   . Acute sinus infection 07/2017  . Adnexal pain 08/13/2015  . Anxiety    NO MEDS  . Dysrhythmia    PALPITATIONS  . Essential (primary) hypertension 07/31/2015  . GERD (gastroesophageal reflux disease)   . History of kidney stones   . Hyperlipidemia   . 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 Myocardial bridge    Past Surgical History:  Procedure Laterality Date  . ABDOMINAL HYSTERECTOMY    . CARDIAC CATHETERIZATION    . CYSTOSCOPY  08/11/2017   Procedure: CYSTOSCOPY;  Surgeon: Gae Dry, MD;  Location: [facility] ORS;  Service: Gynecology;;  . ESOPHAGOGASTRODUODENOSCOPY (EGD) WITH ESOPHAGEAL DILATION    . LAPAROSCOPIC HYSTERECTOMY Bilateral 08/11/2017   Procedure: HYSTERECTOMY TOTAL LAPAROSCOPIC BILATERAL SALPINGECTOMY;  Surgeon: Gae Dry, MD;  Location: [facility] ORS;  Service:  Gynecology;  Laterality: Bilateral;  . TUBAL LIGATION     Family History  Problem Relation Age of Onset  . Diabetes Mother   . Heart failure Mother   . Breast cancer Mother   . Brain cancer Maternal Aunt   . Ovarian cancer Maternal Grandmother   . Rectal cancer Maternal Grandfather   . Hematuria Neg Hx    Social History   Tobacco Use  . Smoking status: Current Every Day Smoker    Packs/day: 0.50    Years: 30.00    Pack years: 15.00    Types: Cigarettes  . Smokeless tobacco: Never Used  Vaping Use  . Vaping Use: Never used  Substance Use Topics  . Alcohol use: No  . Drug use: No    Current Outpatient Medications:  .  acetaminophen (TYLENOL) 325 MG tablet, Take 650 mg by mouth every 6 (six) hours as needed for moderate pain or headache. (Patient not taking: Reported on 12/15/2020), Disp: , Rfl:  .  albuterol (PROVENTIL HFA;VENTOLIN HFA) 108 (90 Base) MCG/ACT inhaler, Inhale 1-2 puffs into the lungs every 6 (six) hours as needed for wheezing or shortness of breath. (Patient not taking: Reported on 12/15/2020), Disp: 1 Inhaler, Rfl: 0 .  chlorpheniramine-HYDROcodone (TUSSIONEX PENNKINETIC ER) 10-8 MG/5ML SUER, Take 5 mLs by mouth every 12 (twelve) hours as needed. (Patient not taking: Reported on 12/15/2020), Disp: 115 mL, Rfl: 0 .  doxycycline (VIBRAMYCIN) 100 MG capsule, Take 1 capsule (  100 mg total) by mouth 2 (two) times daily. (Patient not taking: Reported on 12/15/2020), Disp: 14 capsule, Rfl: 0 .  hydrOXYzine (ATARAX/VISTARIL) 25 MG tablet, TAKE 1 TABLET (25 MG TOTAL) BY MOUTH 3 (THREE) TIMES DAILY AS NEEDED FOR ITCHING (Patient not taking: Reported on 12/15/2020), Disp: , Rfl: 0 .  ibuprofen (ADVIL,MOTRIN) 200 MG tablet, Take 400 mg by mouth every 6 (six) hours as needed for headache or moderate pain. (Patient not taking: Reported on 12/15/2020), Disp: , Rfl:  .  metoprolol tartrate (LOPRESSOR) 100 MG tablet, TK 1 T PO THE NIGHT B THE HEART SCAN AND 1 T 90 MINUTES B SCAN. BRING EXTRA TS  WITH YOU. (Patient not taking: Reported on 12/15/2020), Disp: , Rfl: 0 .  metoprolol tartrate (LOPRESSOR) 25 MG tablet, , Disp: , Rfl:  .  Olopatadine HCl 0.2 % SOLN, PLACE 1 DROP INTO BOTH EYES ONCE DAILY (Patient not taking: Reported on 12/15/2020), Disp: , Rfl: 0 .  pantoprazole (PROTONIX) 40 MG tablet, Take by mouth. (Patient not taking: Reported on 12/15/2020), Disp: , Rfl:  .  pramipexole (MIRAPEX) 0.125 MG tablet, Take 1 tablet (0.125 mg total) by mouth at bedtime. (Patient not taking: Reported on 12/15/2020), Disp: 30 tablet, Rfl: 0 .  predniSONE (DELTASONE) 50 MG tablet, 1 tablet daily x 5 days. (Patient not taking: Reported on 12/15/2020), Disp: 5 tablet, Rfl: 0 .  rOPINIRole (REQUIP) 0.25 MG tablet, Take by mouth as needed.  (Patient not taking: Reported on 12/15/2020), Disp: , Rfl:  Allergies: Other and Codeine  Review of Systems  Constitutional: Negative for chills, fever and malaise/fatigue.  HENT: Negative for congestion, sinus pain and sore throat.   Eyes: Negative for blurred vision and pain.  Respiratory: Negative for cough and wheezing.   Cardiovascular: Negative for chest pain and leg swelling.  Gastrointestinal: Positive for abdominal pain. Negative for constipation, diarrhea, heartburn, nausea and vomiting.  Genitourinary: Negative for dysuria, frequency, hematuria and urgency.  Musculoskeletal: Positive for joint pain. Negative for back pain, myalgias and neck pain.  Skin: Negative for itching and rash.  Neurological: Negative for dizziness, tremors and weakness.  Endo/Heme/Allergies: Does not bruise/bleed easily.  Psychiatric/Behavioral: Negative for depression. The patient is not nervous/anxious and does not have insomnia.     Objective: BP 120/80   Ht 5\' 1"  (1.549 m)   Wt 162 lb (73.5 kg)   LMP 06/10/2017 (Approximate)   BMI 30.61 kg/m   Filed Weights   12/15/20 0858  Weight: 162 lb (73.5 kg)   Body mass index is 30.61 kg/m. Physical Exam Constitutional:       General: She is not in acute distress.    Appearance: She is well-developed.  Genitourinary:     Vulva, bladder and rectum normal.     No lesions in the vagina.     Genitourinary Comments: Vaginal cuff well healed     Vaginal cuff intact.    No vaginal bleeding.     No vaginal prolapse present.    No vaginal atrophy present.     Right Adnexa: tender.    Right Adnexa: no mass present.    Left Adnexa: tender.    Left Adnexa: no mass present.    Adnexa exam comments: No mass palpated Mild T, L>R.     Cervix is absent.     Uterus is absent.     Pelvic exam was performed with patient in the lithotomy position.  Breasts:     Right: No mass, skin change or  tenderness.     Left: No mass, skin change or tenderness.    HENT:     Head: Normocephalic and atraumatic. No laceration.     Right Ear: Hearing normal.     Left Ear: Hearing normal.     Mouth/Throat:     Pharynx: Uvula midline.  Eyes:     Pupils: Pupils are equal, round, and reactive to light.  Neck:     Thyroid: No thyromegaly.  Cardiovascular:     Rate and Rhythm: Normal rate and regular rhythm.     Heart sounds: No murmur heard. No friction rub. No gallop.   Pulmonary:     Effort: Pulmonary effort is normal. No respiratory distress.     Breath sounds: Normal breath sounds. No wheezing.  Abdominal:     General: Bowel sounds are normal. There is no distension.     Palpations: Abdomen is soft.     Tenderness: There is no abdominal tenderness. There is no rebound.  Musculoskeletal:        General: Normal range of motion.     Cervical back: Normal range of motion and neck supple.  Neurological:     Mental Status: She is alert and oriented to person, place, and time.     Cranial Nerves: No cranial nerve deficit.  Skin:    General: Skin is warm and dry.  Psychiatric:        Judgment: Judgment normal.  Vitals reviewed.     Assessment: Annual Exam 1. Women's annual routine gynecological examination   2. Screening  for cervical cancer   3. Encounter for screening mammogram for malignant neoplasm of breast   4. Pelvic pain   5. Screen for colon cancer     Plan:            1.  Vaginal Screening-  Pap smear done today  2. Breast screening- Exam annually and mammogram scheduled  3. Colonoscopy every 10 years, Hemoccult testing after age 19  4. Labs managed by PCP  5. Counseling for hormonal therapy: none   6. Pelvic pain.  Etiologies discussed.  Ultrasound to assess pelvic and ovaries.  Also consider GI and adhesions and alternative etiologies.   Referral for GI eval as is due for colonoscopy and may also help w this investigation  An addidtional total of 20 minutes were spent face-to-face with the patient as well as preparation, review, communication, and documentation during this encounter.  This is related to pelvic pain and its concerns and work up planned.      F/U  Return for Sch GYN [place] then f/u PH.  Barnett Applebaum, MD, [facility], [facility] 12/15/2020  9:40 AM

## 2020-12-17 LAB — CYTOLOGY - PAP: Diagnosis: NEGATIVE

## 2020-12-23 ENCOUNTER — Ambulatory Visit: Payer: Medicaid Other | Admitting: Podiatry

## 2020-12-24 ENCOUNTER — Other Ambulatory Visit: Payer: Self-pay

## 2020-12-24 ENCOUNTER — Other Ambulatory Visit: Payer: Self-pay | Admitting: Obstetrics & Gynecology

## 2020-12-24 ENCOUNTER — Ambulatory Visit
Admission: RE | Admit: 2020-12-24 | Discharge: 2020-12-24 | Disposition: A | Discharge status: Home / Self Care | Payer: Medicaid Other | Source: Ambulatory Visit | Attending: Obstetrics & Gynecology | Admitting: Obstetrics & Gynecology

## 2020-12-24 DIAGNOSIS — Z1231 Encounter for screening mammogram for malignant neoplasm of breast: Secondary | ICD-10-CM | POA: Diagnosis present

## 2020-12-24 DIAGNOSIS — R928 Other abnormal and inconclusive findings on diagnostic imaging of breast: Secondary | ICD-10-CM

## 2020-12-24 DIAGNOSIS — N6310 Unspecified lump in the right breast, unspecified quadrant: Secondary | ICD-10-CM

## 2020-12-25 ENCOUNTER — Encounter: Payer: Self-pay | Admitting: Obstetrics and Gynecology

## 2020-12-25 ENCOUNTER — Other Ambulatory Visit: Payer: Self-pay | Admitting: Obstetrics & Gynecology

## 2020-12-25 DIAGNOSIS — R928 Other abnormal and inconclusive findings on diagnostic imaging of breast: Secondary | ICD-10-CM

## 2020-12-30 ENCOUNTER — Other Ambulatory Visit: Payer: Self-pay | Admitting: Obstetrics & Gynecology

## 2020-12-30 ENCOUNTER — Other Ambulatory Visit: Payer: Self-pay

## 2020-12-30 ENCOUNTER — Ambulatory Visit
Admission: RE | Admit: 2020-12-30 | Discharge: 2020-12-30 | Disposition: A | Discharge status: Home / Self Care | Payer: Medicaid Other | Source: Ambulatory Visit | Attending: Obstetrics & Gynecology | Admitting: Obstetrics & Gynecology

## 2020-12-30 ENCOUNTER — Ambulatory Visit: Payer: Medicaid Other | Admitting: Podiatry

## 2020-12-30 DIAGNOSIS — R928 Other abnormal and inconclusive findings on diagnostic imaging of breast: Secondary | ICD-10-CM | POA: Insufficient documentation

## 2020-12-30 DIAGNOSIS — N6310 Unspecified lump in the right breast, unspecified quadrant: Secondary | ICD-10-CM | POA: Insufficient documentation

## 2021-01-01 NOTE — Progress Notes (Signed)
Discussed the results of mammogram and breast ultrasound with patient.  Plans for needle based biopsy, to be scheduled at [facility] soon.  All questions answered.  Pt also to have pelvic US in June, as well as planning for GI visit and potential colonoscopy.

## 2021-01-15 ENCOUNTER — Ambulatory Visit: Payer: Medicaid Other

## 2021-01-23 ENCOUNTER — Ambulatory Visit
Admission: RE | Admit: 2021-01-23 | Discharge: 2021-01-23 | Disposition: A | Discharge status: Home / Self Care | Payer: Managed Care, Other (non HMO) | Source: Ambulatory Visit | Attending: Obstetrics & Gynecology | Admitting: Obstetrics & Gynecology

## 2021-01-23 ENCOUNTER — Other Ambulatory Visit: Payer: Medicaid Other

## 2021-01-23 ENCOUNTER — Other Ambulatory Visit: Payer: Self-pay

## 2021-01-23 ENCOUNTER — Telehealth: Payer: Medicaid Other

## 2021-01-23 DIAGNOSIS — R928 Other abnormal and inconclusive findings on diagnostic imaging of breast: Secondary | ICD-10-CM | POA: Diagnosis not present

## 2021-01-23 DIAGNOSIS — N6310 Unspecified lump in the right breast, unspecified quadrant: Secondary | ICD-10-CM | POA: Insufficient documentation

## 2021-01-23 HISTORY — PX: BREAST BIOPSY: SHX20

## 2021-01-26 ENCOUNTER — Other Ambulatory Visit: Payer: Self-pay

## 2021-01-26 DIAGNOSIS — N63 Unspecified lump in unspecified breast: Secondary | ICD-10-CM

## 2021-01-26 LAB — SURGICAL PATHOLOGY

## 2021-01-26 NOTE — Progress Notes (Signed)
Received notification from Electa Sniff of benign breast lesion, and request for surgical consult.  Spoke to patient, and she has already scheduled appointment with Dr. Bary Castilla for 01/27/21 at 3:00.

## 2021-01-27 ENCOUNTER — Other Ambulatory Visit: Payer: Self-pay | Admitting: General Surgery

## 2021-01-27 ENCOUNTER — Telehealth: Payer: Self-pay

## 2021-01-27 DIAGNOSIS — Z800 Family history of malignant neoplasm of digestive organs: Secondary | ICD-10-CM

## 2021-01-27 NOTE — Progress Notes (Signed)
Subjective:     Patient ID: Nicole Horton is a 50 y.o. female.   HPI   The following portions of the patient's history were reviewed and updated as appropriate.   This a new patient is here today for: office visit. Here for a breast evaluation. Right breast biopsy at [facility] was 01-23-21 showing fibroadenoma. She could not feel anything different in the breast prior to the mammogram. Prior mammogram was 2019. Dr. Kenton Kingfisher found a mass on her left ovary.     Review of Systems  Constitutional: Negative for chills and fever.  Respiratory: Negative for cough.          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  No other significant CAD.   CYSTOSCOPY   08/11/2017   EGD       HYSTERECTOMY   08/2017   TUBAL LIGATION                    OB History     Gravida  4   Para  4   Term  4   Preterm      AB      Living  4      SAB      IAB      Ectopic      Molar      Multiple      Live Births           Obstetric Comments  Age at first period 70 Age of first pregnancy 51             Social History           Socioeconomic History   Marital status: Married  Tobacco Use    Smoking status: Current Every Day Smoker      Packs/day: 0.25      Years: 30.00      Pack years: 7.50      Types: Cigarettes   Smokeless tobacco: Never Used   Tobacco comment: has decreased to half a pack a day now and doesn't smoke for 9 hours at work  Scientific laboratory technician Use: Never used  Substance and Sexual Activity   Alcohol use: No   Drug use: No   Sexual activity: Yes      Partners: Male      Birth control/protection: Surgical  Allergies  Allergen Reactions   Codeine Vomiting   Other Nausea      Phlocodine      Current Medications        Current Outpatient Medications  Medication Sig Dispense Refill   METOPROLOL SUCCINATE ORAL Take 25 mg by mouth once daily       metFORMIN (GLUCOPHAGE) 500 MG tablet Take 500 mg by mouth 2 (two) times daily with meals (Patient not taking: No sig reported)        No current facility-administered medications for this visit.             Family History  Problem Relation Age of Onset   Heart failure Mother     Cardiomyopathy (Abnormal function of the heart muscle) Mother          NICM   Diabetes Mother     Breast cancer Mother 76   Diabetes Father     Colon cancer Father 61   Cardiomyopathy (Abnormal function of the heart muscle) Brother          NICM   Cirrhosis Brother     Coronary Artery Disease (Blocked arteries around heart) Maternal Grandmother     Ovarian cancer Maternal Grandmother 57   Rectal cancer Maternal Grandfather     Cardiomyopathy (Abnormal function of the heart muscle) Maternal Uncle     Brain cancer Maternal Aunt             Objective:   Physical Exam Exam conducted with a chaperone present.  Constitutional:      Appearance: Normal appearance.  Cardiovascular:     Rate and Rhythm: Normal rate and regular rhythm.     Pulses: Normal pulses.     Heart sounds: Normal heart sounds.  Pulmonary:     Effort: Pulmonary effort is normal.     Breath sounds: Normal breath sounds.  Chest:   Breasts:     Right: Mass present. No axillary adenopathy or supraclavicular adenopathy.     Left: Normal. No axillary adenopathy or supraclavicular adenopathy.       Comments: Bruising in the upper outer quadrant of the right breast from her recent biopsy.  Breasts are symmetrical in volume. Musculoskeletal:     Cervical back: Neck supple.  Lymphadenopathy:     Upper Body:     Right upper body: No supraclavicular or axillary adenopathy.     Left upper body: No supraclavicular or axillary adenopathy.  Skin:    General: Skin is warm and dry.  Neurological:     Mental Status: She is alert and oriented to person, place, and time.  Psychiatric:        Mood and Affect: Mood normal.        Behavior: Behavior normal.      Labs and Radiology:  Mammograms of Dec 24, 2020 through January 23, 2021 were independently reviewed as well as associated ultrasound.     January 23, 2021 biopsy:   DIAGNOSIS:  A. BREAST, RIGHT 10:00 3 CM FN; ULTRASOUND-GUIDED BIOPSY:  - FIBROEPITHELIAL LESION, SEE COMMENT.   Comment:  Sections demonstrate a fibroepithelial lesion with mildly cellular  stroma, usual ductal hyperplasia, sclerosing adenosis, and microcysts.  Intracanalicular architecture is not present in this sample. Cytologic  atypia is not identified. Based on this sample, a complex fibroadenoma  is favored. Clinical correlation is advised.  (14-gauge core biopsy)   Ultrasound:   In office ultrasound was done to confirm visualization of the mass recently biopsied.  In the  10 o'clock position 3 cm from the nipple a well-defined 1.36 x 1.63 x 1.86 cm mass with evidence of a biopsy clip is present.  This is hypoechoic with faint posterior shadowing and clear edge effect.  BI-RADS-3.   (Informal review with cardiology of the diagnosis of myocardial bridge: Does not affect diastolic filling of the coronary vessels).    Assessment:     Cellular fibroadenoma.    Plan:     Indications for excision to  be sure with this large lesion and small volume of sample there is no upstaging.   Patient has a family history of ovarian cancer in her maternal grandmother and breast cancer under age 72 and her mother.  This along with the paternal history of colon cancer at age 37 and maternal grandfather with renal cancer warrants genetic testing.      This note is partially prepared by Karie Fetch, RN, acting as a scribe in the presence of Dr. Hervey Ard, MD.  The documentation recorded by the scribe accurately reflects the service I personally performed and the decisions made by me.    Robert Bellow, MD FACS

## 2021-01-27 NOTE — Telephone Encounter (Signed)
Pt calling; needs ref to Dr. Bary Castilla; had needle bx Fri - fibroepithelial anatoma - benign; needs to come out. [phone] b/c pt works at [employer] she has appt already c Dr. Bary Castilla today at [place]; d/t ins still needs referral; has appt for endoscopy coming up; colonoscopy not scheduled yet.  Pt aware Spooner not in office until next week; aware msg will be sent to him.

## 2021-01-28 ENCOUNTER — Other Ambulatory Visit: Payer: Self-pay

## 2021-01-28 ENCOUNTER — Ambulatory Visit
Admission: RE | Admit: 2021-01-28 | Discharge: 2021-01-28 | Disposition: A | Discharge status: Home / Self Care | Payer: Medicaid Other | Source: Ambulatory Visit | Attending: Obstetrics & Gynecology | Admitting: Obstetrics & Gynecology

## 2021-01-28 DIAGNOSIS — R102 Pelvic and perineal pain: Secondary | ICD-10-CM

## 2021-01-29 ENCOUNTER — Other Ambulatory Visit: Payer: Self-pay | Admitting: General Surgery

## 2021-02-01 NOTE — Progress Notes (Signed)
Review of ULTRASOUND.    I have personally reviewed images and report of recent ultrasound done at [facility].    Plan of management to be discussed with patient. Will discuss at appointment this week.  Barnett Applebaum, MD, [facility], [facility] 02/01/2021  9:47 AM

## 2021-02-02 ENCOUNTER — Encounter: Payer: Self-pay | Admitting: Obstetrics & Gynecology

## 2021-02-02 ENCOUNTER — Other Ambulatory Visit: Payer: Self-pay

## 2021-02-02 ENCOUNTER — Other Ambulatory Visit: Payer: Self-pay | Admitting: Obstetrics & Gynecology

## 2021-02-02 ENCOUNTER — Ambulatory Visit (INDEPENDENT_AMBULATORY_CARE_PROVIDER_SITE_OTHER): Payer: Medicaid Other | Admitting: Obstetrics & Gynecology

## 2021-02-02 VITALS — BP 120/80 | Ht 61.0 in | Wt 158.0 lb

## 2021-02-02 DIAGNOSIS — R928 Other abnormal and inconclusive findings on diagnostic imaging of breast: Secondary | ICD-10-CM

## 2021-02-02 DIAGNOSIS — R102 Pelvic and perineal pain: Secondary | ICD-10-CM | POA: Diagnosis not present

## 2021-02-02 NOTE — Telephone Encounter (Signed)
Referral placed.

## 2021-02-02 NOTE — Progress Notes (Signed)
  HPI: Pt cont to have lower abd pains, non-specific in location. Intermittent in timing, moderate in intensity, no assoc sx's or modifiers.  Prior hysterectomy.  Pain is not predictably cyclic.  Ultrasound demonstrates no cysts or Gyn pathology (prior hyst).  PMHx: She  has a past medical history of A-fib ([place]), Abnormal uterine bleeding (05/15/2014), Acute sinus infection (07/2017), Adnexal pain (08/13/2015), Anxiety, Dysrhythmia, Essential (primary) hypertension (07/31/2015), Family history of breast cancer, GERD (gastroesophageal reflux disease), History of kidney stones, Hyperlipidemia, Malformation of coronary vessel (05/15/2014), and Myocardial bridge. Also,  has a past surgical history that includes Tubal ligation; Cardiac catheterization; Esophagogastroduodenoscopy (egd) with esophageal dilation; Laparoscopic hysterectomy (Bilateral, 08/11/2017); Cystoscopy (08/11/2017); Abdominal hysterectomy; and Breast biopsy (Right, 01/23/2021)., family history includes Brain cancer in her maternal aunt; Breast cancer (age of onset: 53) in her mother; Diabetes in her mother; Heart failure in her mother; Ovarian cancer (age of onset: 33) in her maternal grandmother; Rectal cancer in her maternal grandfather.,  reports that she has been smoking cigarettes. She has a 15.00 pack-year smoking history. She has never used smokeless tobacco. She reports that she does not drink alcohol and does not use drugs.  She has a current medication list which includes the following prescription(s): acetaminophen, albuterol, flecainide, ibuprofen, metoprolol succinate, rosuvastatin, and vitamin d (ergocalciferol). Also, is allergic to other and codeine.  Review of Systems  All other systems reviewed and are negative.  Objective: BP 120/80   Ht 5\' 1"  (1.549 m)   Wt 158 lb (71.7 kg)   LMP 06/10/2017 (Approximate)   BMI 29.85 kg/m   Physical examination Constitutional NAD, Conversant  Skin No rashes, lesions or ulceration.    Extremities: Moves all appropriately.  Normal ROM for age. No lymphadenopathy.  Neuro: Grossly intact  Psych: Oriented to PPT.  Normal mood. Normal affect.   Assessment:  Pelvic pain No s/sx GYN etiology.  Ovaries normal on exam and [place]. Consider GI, other as etiology.  Pt to see Dr Bary Castilla soon, and will assess with him as next investigation.  A total of 20 minutes were spent face-to-face with the patient as well as preparation, review, communication, and documentation during this encounter.   Barnett Applebaum, MD, [facility], [facility] 02/02/2021  4:21 PM

## 2021-02-03 ENCOUNTER — Other Ambulatory Visit
Admission: RE | Admit: 2021-02-03 | Discharge: 2021-02-03 | Disposition: A | Discharge status: Home / Self Care | Payer: Managed Care, Other (non HMO) | Source: Ambulatory Visit | Attending: General Surgery | Admitting: General Surgery

## 2021-02-03 ENCOUNTER — Encounter: Payer: Self-pay | Admitting: General Surgery

## 2021-02-03 DIAGNOSIS — Z01810 Encounter for preprocedural cardiovascular examination: Secondary | ICD-10-CM | POA: Diagnosis not present

## 2021-02-03 DIAGNOSIS — Z01818 Encounter for other preprocedural examination: Secondary | ICD-10-CM | POA: Insufficient documentation

## 2021-02-03 NOTE — Patient Instructions (Addendum)
Your procedure is scheduled on: 7/8/227/7/ Report to [place]. To find out your arrival time please call [phone] between 1PM - 3PM on 02/12/21 .  Remember: Instructions that are not followed completely may result in serious medical risk, up to and including death, or upon the discretion of your surgeon and anesthesiologist your surgery may need to be rescheduled.     _X__ 1. Do not eat food after midnight the night before your procedure.                 No gum chewing or hard candies. You may drink clear liquids up to 2 hours                 before you are scheduled to arrive for your surgery- DO not drink clear                 liquids within 2 hours of the start of your surgery.                 Clear Liquids include:  water, apple juice without pulp, clear carbohydrate                 drink such as Clearfast or Gatorade, Black Coffee or Tea (Do not add                 anything to coffee or tea). Diabetics water only  __X__2.  On the morning of surgery brush your teeth with toothpaste and water, you                 may rinse your mouth with mouthwash if you wish.  Do not swallow any              toothpaste of mouthwash.     _X__ 3.  No Alcohol for 24 hours before or after surgery.   _X__ 4.  Do Not Smoke or use e-cigarettes For 24 Hours Prior to Your Surgery.                 Do not use any chewable tobacco products for at least 6 hours prior to                 surgery.  ____  5.  Bring all medications with you on the day of surgery if instructed.   __X__  6.  Notify your doctor if there is any change in your medical condition      (cold, fever, infections).     Do not wear jewelry, make-up, hairpins, clips or nail polish. Do not wear lotions, powders, or perfumes.  Do not shave 48 hours prior to surgery. Men may shave face and neck. Do not bring valuables to the hospital.    [facility] is not responsible for any belongings  or valuables.  Contacts, dentures/partials or body piercings may not be worn into surgery. Bring a case for your contacts, glasses or hearing aids, a denture cup will be supplied. Leave your suitcase in the car. After surgery it may be brought to your room. For patients admitted to the hospital, discharge time is determined by your treatment team.   Patients discharged the day of surgery will not be allowed to drive home.   Please read over the following fact sheets that you were given:   MRSA Information, CHG soap  __X__ Take these medicines the morning of surgery with A SIP OF  WATER:    1. none  2.   3.   4.  5.  6.  ____ Fleet Enema (as directed)   __X__ Use CHG Soap/SAGE wipes as directed  __X__ Use inhalers on the day of surgery  ____ Stop metformin/Janumet/Farxiga 2 days prior to surgery    ____ Take 1/2 of usual insulin dose the night before surgery. No insulin the morning          of surgery.   ____ Stop Blood Thinners Coumadin/Plavix/Xarelto/Pleta/Pradaxa/Eliquis/Effient/Aspirin  on   Or contact your Surgeon, Cardiologist or Medical Doctor regarding  ability to stop your blood thinners  __X__ Stop Anti-inflammatories 7 days before surgery such as Advil, Ibuprofen, Motrin,  BC or Goodies Powder, Naprosyn, Naproxen, Aleve, Aspirin    __X__ Stop all herbal supplements, fish oil or vitamin E until after surgery.    ____ Bring C-Pap to the hospital.

## 2021-02-12 MED ORDER — CHLORHEXIDINE GLUCONATE 0.12 % MT SOLN: 15.0000 mL | Freq: Once | OROMUCOSAL | Status: DC

## 2021-02-12 MED ORDER — CHLORHEXIDINE GLUCONATE CLOTH 2 % EX PADS: 6.0000 | MEDICATED_PAD | Freq: Once | CUTANEOUS | Status: DC

## 2021-02-12 MED ORDER — LACTATED RINGERS IV SOLN: INTRAVENOUS | Status: DC

## 2021-02-12 MED ORDER — ORAL CARE MOUTH RINSE: 15.0000 mL | Freq: Once | OROMUCOSAL | Status: DC

## 2021-02-13 ENCOUNTER — Encounter
Admission: RE | Disposition: A | Discharge status: Home / Self Care | Payer: Self-pay | Source: Home / Self Care | Attending: General Surgery

## 2021-02-13 ENCOUNTER — Ambulatory Visit: Payer: Managed Care, Other (non HMO) | Admitting: Urgent Care

## 2021-02-13 ENCOUNTER — Ambulatory Visit
Admission: RE | Admit: 2021-02-13 | Discharge: 2021-02-13 | Disposition: A | Discharge status: Home / Self Care | Payer: Managed Care, Other (non HMO) | Attending: General Surgery | Admitting: General Surgery

## 2021-02-13 ENCOUNTER — Other Ambulatory Visit: Payer: Self-pay

## 2021-02-13 ENCOUNTER — Encounter: Payer: Self-pay | Admitting: General Surgery

## 2021-02-13 ENCOUNTER — Ambulatory Visit: Payer: Managed Care, Other (non HMO)

## 2021-02-13 DIAGNOSIS — Z885 Allergy status to narcotic agent status: Secondary | ICD-10-CM | POA: Insufficient documentation

## 2021-02-13 DIAGNOSIS — Z419 Encounter for procedure for purposes other than remedying health state, unspecified: Secondary | ICD-10-CM

## 2021-02-13 DIAGNOSIS — F17210 Nicotine dependence, cigarettes, uncomplicated: Secondary | ICD-10-CM | POA: Insufficient documentation

## 2021-02-13 DIAGNOSIS — Z79899 Other long term (current) drug therapy: Secondary | ICD-10-CM | POA: Insufficient documentation

## 2021-02-13 DIAGNOSIS — D241 Benign neoplasm of right breast: Secondary | ICD-10-CM | POA: Diagnosis present

## 2021-02-13 HISTORY — PX: EXCISION OF BREAST LESION: SHX6676

## 2021-02-13 HISTORY — DX: Palpitations: R00.2

## 2021-02-13 LAB — VITAMIN B12: Vitamin B-12: 166 pg/mL — ABNORMAL LOW (ref 180–914)

## 2021-02-13 SURGERY — EXCISION OF BREAST LESION
Anesthesia: General | Laterality: Right

## 2021-02-13 MED ORDER — APREPITANT 40 MG PO CAPS
40.0000 mg | ORAL_CAPSULE | Freq: Once | ORAL | Status: AC
  Administered 2021-02-13: 40 mg via ORAL

## 2021-02-13 MED ORDER — LIDOCAINE HCL (PF) 2 % IJ SOLN
INTRAMUSCULAR | Status: AC
  Filled 2021-02-13: qty 5

## 2021-02-13 MED ORDER — LACTATED RINGERS IV SOLN: INTRAVENOUS | Status: DC | PRN

## 2021-02-13 MED ORDER — PHENYLEPHRINE HCL (PRESSORS) 10 MG/ML IV SOLN
INTRAVENOUS | Status: DC | PRN
  Administered 2021-02-13: 100 ug via INTRAVENOUS

## 2021-02-13 MED ORDER — FENTANYL CITRATE (PF) 100 MCG/2ML IJ SOLN
INTRAMUSCULAR | Status: AC
  Administered 2021-02-13: 25 ug via INTRAVENOUS
  Filled 2021-02-13: qty 2

## 2021-02-13 MED ORDER — DEXAMETHASONE SODIUM PHOSPHATE 10 MG/ML IJ SOLN
INTRAMUSCULAR | Status: DC | PRN
  Administered 2021-02-13: 4 mg via INTRAVENOUS

## 2021-02-13 MED ORDER — FENTANYL CITRATE (PF) 100 MCG/2ML IJ SOLN
INTRAMUSCULAR | Status: AC
  Filled 2021-02-13: qty 2

## 2021-02-13 MED ORDER — CHLORHEXIDINE GLUCONATE 0.12 % MT SOLN
OROMUCOSAL | Status: AC
  Filled 2021-02-13: qty 15

## 2021-02-13 MED ORDER — BUPIVACAINE HCL (PF) 0.5 % IJ SOLN
INTRAMUSCULAR | Status: DC | PRN
  Administered 2021-02-13: 30 mL

## 2021-02-13 MED ORDER — APREPITANT 40 MG PO CAPS
ORAL_CAPSULE | ORAL | Status: AC
  Filled 2021-02-13: qty 1

## 2021-02-13 MED ORDER — TRAMADOL HCL 50 MG PO TABS: 50.0000 mg | ORAL_TABLET | ORAL | 0 refills | Status: AC | PRN

## 2021-02-13 MED ORDER — ONDANSETRON HCL 4 MG/2ML IJ SOLN
INTRAMUSCULAR | Status: DC | PRN
  Administered 2021-02-13: 4 mg via INTRAVENOUS

## 2021-02-13 MED ORDER — EPHEDRINE SULFATE 50 MG/ML IJ SOLN
INTRAMUSCULAR | Status: DC | PRN
  Administered 2021-02-13: 5 mg via INTRAVENOUS

## 2021-02-13 MED ORDER — MIDAZOLAM HCL 2 MG/2ML IJ SOLN
INTRAMUSCULAR | Status: DC | PRN
  Administered 2021-02-13: 2 mg via INTRAVENOUS

## 2021-02-13 MED ORDER — ACETAMINOPHEN 10 MG/ML IV SOLN
INTRAVENOUS | Status: DC | PRN
  Administered 2021-02-13: 1000 mg via INTRAVENOUS

## 2021-02-13 MED ORDER — PROPOFOL 10 MG/ML IV BOLUS
INTRAVENOUS | Status: AC
  Filled 2021-02-13: qty 20

## 2021-02-13 MED ORDER — FENTANYL CITRATE (PF) 100 MCG/2ML IJ SOLN
INTRAMUSCULAR | Status: DC | PRN
  Administered 2021-02-13 (×2): 25 ug via INTRAVENOUS

## 2021-02-13 MED ORDER — LIDOCAINE HCL (CARDIAC) PF 100 MG/5ML IV SOSY
PREFILLED_SYRINGE | INTRAVENOUS | Status: DC | PRN
  Administered 2021-02-13: 100 mg via INTRAVENOUS

## 2021-02-13 MED ORDER — BUPIVACAINE HCL (PF) 0.5 % IJ SOLN
INTRAMUSCULAR | Status: AC
  Filled 2021-02-13: qty 30

## 2021-02-13 MED ORDER — ACETAMINOPHEN 10 MG/ML IV SOLN
INTRAVENOUS | Status: AC
  Filled 2021-02-13: qty 100

## 2021-02-13 MED ORDER — MIDAZOLAM HCL 2 MG/2ML IJ SOLN
INTRAMUSCULAR | Status: AC
  Filled 2021-02-13: qty 2

## 2021-02-13 MED ORDER — STERILE WATER FOR IRRIGATION IR SOLN
Status: DC | PRN
  Administered 2021-02-13: 100 mL

## 2021-02-13 MED ORDER — FENTANYL CITRATE (PF) 100 MCG/2ML IJ SOLN
25.0000 ug | INTRAMUSCULAR | Status: DC | PRN
  Administered 2021-02-13: 25 ug via INTRAVENOUS

## 2021-02-13 MED ORDER — PROPOFOL 10 MG/ML IV BOLUS
INTRAVENOUS | Status: DC | PRN
  Administered 2021-02-13: 50 mg via INTRAVENOUS
  Administered 2021-02-13: 150 mg via INTRAVENOUS

## 2021-02-13 MED ORDER — PROMETHAZINE HCL 25 MG/ML IJ SOLN: 6.2500 mg | INTRAMUSCULAR | Status: DC | PRN

## 2021-02-13 SURGICAL SUPPLY — 36 items
APL PRP STRL LF DISP 70% ISPRP (MISCELLANEOUS) ×1
BINDER BREAST LRG (GAUZE/BANDAGES/DRESSINGS) ×2 IMPLANT
BLADE SURG 15 STRL SS SAFETY (BLADE) ×4 IMPLANT
CANISTER SUCT 1200ML W/VALVE (MISCELLANEOUS) ×2 IMPLANT
CHLORAPREP W/TINT 26 (MISCELLANEOUS) ×2 IMPLANT
COVER PROBE FLX POLY STRL (MISCELLANEOUS) ×2 IMPLANT
DEVICE DUBIN SPECIMEN MAMMOGRA (MISCELLANEOUS) ×2 IMPLANT
DRAPE LAPAROTOMY TRNSV 106X77 (MISCELLANEOUS) ×2 IMPLANT
DRSG GAUZE FLUFF 36X18 (GAUZE/BANDAGES/DRESSINGS) ×2 IMPLANT
DRSG TELFA 3X8 NADH (GAUZE/BANDAGES/DRESSINGS) ×2 IMPLANT
ELECT CAUTERY BLADE TIP 2.5 (TIP) ×2
ELECT COATED BLADE 2.86 ST (ELECTRODE) ×2 IMPLANT
ELECT REM PT RETURN 9FT ADLT (ELECTROSURGICAL) ×2
ELECTRODE CAUTERY BLDE TIP 2.5 (TIP) ×1 IMPLANT
ELECTRODE REM PT RTRN 9FT ADLT (ELECTROSURGICAL) ×1 IMPLANT
GAUZE 4X4 16PLY ~~LOC~~+RFID DBL (SPONGE) ×2 IMPLANT
GLOVE SURG ENC MOIS LTX SZ7.5 (GLOVE) ×2 IMPLANT
GLOVE SURG UNDER LTX SZ8 (GLOVE) ×2 IMPLANT
GOWN STRL REUS W/ TWL LRG LVL3 (GOWN DISPOSABLE) ×2 IMPLANT
GOWN STRL REUS W/TWL LRG LVL3 (GOWN DISPOSABLE) ×4
KIT TURNOVER KIT A (KITS) ×2 IMPLANT
LABEL OR SOLS (LABEL) ×2 IMPLANT
MANIFOLD NEPTUNE II (INSTRUMENTS) ×2 IMPLANT
NEEDLE HYPO 25X1 1.5 SAFETY (NEEDLE) ×2 IMPLANT
PACK BASIN MINOR ARMC (MISCELLANEOUS) ×2 IMPLANT
STRIP CLOSURE SKIN 1/2X4 (GAUZE/BANDAGES/DRESSINGS) ×2 IMPLANT
SUT SILK 2 0 (SUTURE) ×2
SUT SILK 2-0 18XBRD TIE 12 (SUTURE) ×1 IMPLANT
SUT VIC AB 2-0 CT1 27 (SUTURE) ×4
SUT VIC AB 2-0 CT1 TAPERPNT 27 (SUTURE) ×2 IMPLANT
SUT VIC AB 4-0 FS2 27 (SUTURE) ×2 IMPLANT
SWABSTK COMLB BENZOIN TINCTURE (MISCELLANEOUS) ×2 IMPLANT
SYR 10ML LL (SYRINGE) ×2 IMPLANT
SYR BULB IRRIG 60ML STRL (SYRINGE) ×2 IMPLANT
SYR CONTROL 10ML LL (SYRINGE) ×2 IMPLANT
WATER STERILE IRR 1000ML POUR (IV SOLUTION) ×2 IMPLANT

## 2021-02-13 NOTE — Anesthesia Preprocedure Evaluation (Signed)
Anesthesia Evaluation  Patient identified by MRN, date of birth, ID band Patient awake    Reviewed: Allergy & Precautions, NPO status , Patient's Chart, lab work & pertinent test results  History of Anesthesia Complications Negative for: history of anesthetic complications  Airway Mallampati: III  TM Distance: <3 FB Neck ROM: full    Dental  (+) Chipped, Poor Dentition, Missing   Pulmonary neg shortness of breath, Current Smoker and Patient abstained from smoking.,    Pulmonary exam normal        Cardiovascular Exercise Tolerance: Good hypertension, + CAD  Normal cardiovascular exam     Neuro/Psych PSYCHIATRIC DISORDERS negative neurological ROS     GI/Hepatic Neg liver ROS, GERD  Medicated and Controlled,  Endo/Other  negative endocrine ROS  Renal/GU Renal disease     Musculoskeletal   Abdominal   Peds  Hematology negative hematology ROS (+)   Anesthesia Other Findings Past Medical History: No date: A-fib ([facility]) 05/15/2014: Abnormal uterine bleeding     Comment:  Overview:  [place]: shows 1. 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 No date: Palpitations  Past Surgical History: No date: ABDOMINAL HYSTERECTOMY 01/23/2021: BREAST BIOPSY; Right     Comment:  Bx showed fibroepithelial (fibroadenoma) lesion 01/18/2014: CARDIAC CATHETERIZATION; N/A     Comment:  Normal coronaries; myocaridial bridge; normal EF 08/11/2017: CYSTOSCOPY     Comment:  Procedure: CYSTOSCOPY;  Surgeon: Gae Dry, MD;                Location: [facility] ORS;  Service:  Gynecology;; No date: ESOPHAGOGASTRODUODENOSCOPY (EGD) WITH ESOPHAGEAL DILATION 08/11/2017: LAPAROSCOPIC HYSTERECTOMY; Bilateral     Comment:  Procedure: HYSTERECTOMY TOTAL LAPAROSCOPIC BILATERAL               SALPINGECTOMY;  Surgeon: Gae Dry, MD;  Location:              [facility] ORS;  Service: Gynecology;  Laterality: Bilateral; No date: TUBAL LIGATION  BMI    Body Mass Index: 28.34 kg/m      Reproductive/Obstetrics negative OB ROS                             Anesthesia Physical Anesthesia Plan  ASA: 3  Anesthesia Plan: General LMA   Post-op Pain Management:    Induction: Intravenous  PONV Risk Score and Plan: Dexamethasone, Ondansetron, Midazolam and Treatment may vary due to age or medical condition  Airway Management Planned: LMA  Additional Equipment:   Intra-op Plan:   Post-operative Plan: Extubation in OR  Informed Consent: I have reviewed the patients History and Physical, chart, labs and discussed the procedure including the risks, benefits and alternatives for the proposed anesthesia with the patient or authorized representative who has indicated his/her understanding and acceptance.     Dental Advisory Given  Plan Discussed with: Anesthesiologist, CRNA and Surgeon  Anesthesia Plan Comments: (Patient consented for risks of anesthesia including but not limited to:  - adverse reactions to medications - damage to eyes, teeth,  lips or other oral mucosa - nerve damage due to positioning  - sore throat or hoarseness - Damage to heart, brain, nerves, lungs, other parts of body or loss of life  Patient voiced understanding.)        Anesthesia Quick Evaluation

## 2021-02-13 NOTE — H&P (Signed)
Nicole Horton [id number] 04-Mar-1971     HPI:  50 y/o woman with recent biopsy showing a cellular fibroadenoma.  For formal excision.   Medications Prior to Admission  Medication Sig Dispense Refill Last Dose   acetaminophen (TYLENOL) 325 MG tablet Take 650 mg by mouth every 6 (six) hours as needed for moderate pain or headache.   Past Month   albuterol (PROVENTIL HFA;VENTOLIN HFA) 108 (90 Base) MCG/ACT inhaler Inhale 1-2 puffs into the lungs every 6 (six) hours as needed for wheezing or shortness of breath. 1 Inhaler 0 02/13/2021 at 1210   flecainide (TAMBOCOR) 50 MG tablet Take 50 mg by mouth at bedtime.   02/11/2021   ibuprofen (ADVIL,MOTRIN) 200 MG tablet Take 400 mg by mouth every 6 (six) hours as needed for headache or moderate pain.   Past Month   metoprolol succinate (TOPROL-XL) 25 MG 24 hr tablet Take 25 mg by mouth at bedtime.   02/11/2021   rosuvastatin (CRESTOR) 20 MG tablet Take 20 mg by mouth at bedtime.   02/11/2021   Vitamin D, Ergocalciferol, (DRISDOL) 1.25 MG (50000 UNIT) CAPS capsule Take 50,000 Units by mouth every Friday.   02/06/2021   Allergies  Allergen Reactions   Other Nausea Only and Other (See Comments)    Pholcodine   Codeine Nausea And Vomiting   Past Medical History:  Diagnosis Date   A-fib ([facility])    Abnormal uterine bleeding 05/15/2014   Overview:  [place]: shows 1. 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 LAD   Palpitations    Past Surgical History:  Procedure Laterality Date   ABDOMINAL HYSTERECTOMY     BREAST BIOPSY Right 01/23/2021   Bx showed fibroepithelial (fibroadenoma) lesion   CARDIAC CATHETERIZATION N/A 01/18/2014   Normal coronaries;  myocaridial bridge; normal EF   CYSTOSCOPY  08/11/2017   Procedure: CYSTOSCOPY;  Surgeon: Gae Dry, MD;  Location: [facility] OR[facility];  Service: Gynecology;;   ESOPHAGOGASTRODUODENOSCOPY (EGD) WITH ESOPHAGEAL DILATION     LAPAROSCOPIC HYSTERECTOMY Bilateral 08/11/2017   Procedure: HYSTERECTOMY TOTAL LAPAROSCOPIC BILATERAL SALPINGECTOMY;  Surgeon: Gae Dry, MD;  Location: [facility] OR[facility];  Service: Gynecology;  Laterality: Bilateral;   TUBAL LIGATION     Social History   Socioeconomic History   Marital status: Divorced    Spouse name: Not on file   Number of children: Not on file   Years of education: Not on file   Highest education level: Not on file  Occupational History   Not on file  Tobacco Use   Smoking status: Every Day    Packs/day: 0.50    Years: 30.00    Pack years: 15.00    Types: Cigarettes   Smokeless tobacco: Never  Vaping Use   Vaping Use: Never used  Substance and Sexual Activity   Alcohol use: No   Drug use: No   Sexual activity: Not on file  Other Topics Concern   Not on file  Social History Narrative   Not on file   Social Determinants of Health   Financial Resource Strain: Not on file  Food Insecurity: Not on file  Transportation Needs: Not  on file  Physical Activity: Not on file  Stress: Not on file  Social Connections: Not on file  Intimate Partner Violence: Not on file   Social History   Social History Narrative   Not on file     ROS: Negative.     PE: HEENT: Negative. Lungs: Clear. Cardio: RR.   Assessment/Plan:  Proceed with planned excision of right breast fibroadenoma.   Forest Gleason [facility] 02/13/2021

## 2021-02-13 NOTE — Transfer of Care (Signed)
Immediate Anesthesia Transfer of Care Note  Patient: Nicole Horton  Procedure(s) Performed: EXCISION OF BREAST LESION (Right)  Patient Location: PACU  Anesthesia Type:General  Level of Consciousness: awake  Airway & Oxygen Therapy: Patient Spontanous Breathing  Post-op Assessment: Report given to RN  Post vital signs: Reviewed  Last Vitals:  Vitals Value Taken Time  BP 102/55 02/13/21 1430  Temp 36.2 C 02/13/21 1429  Pulse 72 02/13/21 1433  Resp 16 02/13/21 1433  SpO2 96 % 02/13/21 1433  Vitals shown include unvalidated device data.  Last Pain:  Vitals:   02/13/21 1429  TempSrc:   PainSc: Asleep         Complications: No notable events documented.

## 2021-02-13 NOTE — Op Note (Signed)
Preoperative diagnosis: Cellular fibroadenoma of the right breast, upper outer quadrant.  Postoperative diagnosis: Same.  Operative procedure: Excision right breast fibroadenoma with ultrasound guidance.  Operating Surgeon: Hervey Ard, MD.  Anesthesia: General by LMA, Marcaine 0.5% plain, 30 cc local infiltration.  Estimated blood loss: Less than 3 cc.  Clinical note: This 50 year old woman had a mammographic identification of a mass in the upper outer quadrant of the right breast.  Subsequent ultrasound-guided core biopsy showed a cellular fibroadenoma.  She is admitted for excisional biopsy to confirm no other pathology.  Operative note: The patient underwent general anesthesia and tolerated this well.  The breast was cleansed with ChloraPrep and draped.  Ultrasound was used to identify the location of the mass in the 10 o'clock position of the right breast.  Image captured for permanent record.  Local anesthesia was infiltrated.  A circumareolar incision from the 8 to 12 o'clock position was made sharply and the remaining dissection completed with electrocautery.  The adipose tissue was elevated off the underlying breast parenchyma until the index lesion was identified.  This was excised and radiograph confirmed the previously placed clip was included in the specimen.  The specimen was not oriented due to the benign histology.  The deep tissue was approximated with 2-0 Vicryl figure-of-eight suture.  The adipose layer was approximated in similar fashion.  The skin was closed with a running 4-0 Vicryl subcuticular suture.  Benzoin, Steri-Strips, Telfa and Tegaderm dressing was applied.  Patient tolerated the procedure well and was taken to recovery room in stable condition.

## 2021-02-13 NOTE — Anesthesia Procedure Notes (Signed)
Procedure Name: LMA Insertion Date/Time: 02/13/2021 1:48 PM Performed by: Jonna Clark, CRNA Pre-anesthesia Checklist: Patient identified, Patient being monitored, Timeout performed, Emergency Drugs available and Suction available Patient Re-evaluated:Patient Re-evaluated prior to induction Oxygen Delivery Method: Circle system utilized Preoxygenation: Pre-oxygenation with 100% oxygen Induction Type: IV induction Ventilation: Mask ventilation without difficulty LMA: LMA inserted LMA Size: 3.5 Tube type: Oral Number of attempts: 1 Placement Confirmation: positive ETCO2 and breath sounds checked- equal and bilateral Tube secured with: Tape Dental Injury: Teeth and Oropharynx as per pre-operative assessment

## 2021-02-13 NOTE — Discharge Instructions (Signed)

## 2021-02-15 NOTE — Anesthesia Postprocedure Evaluation (Signed)
Anesthesia Post Note  Patient: Nicole Horton  Procedure(s) Performed: EXCISION OF BREAST LESION (Right)  Patient location during evaluation: PACU Anesthesia Type: General Level of consciousness: awake and alert Pain management: pain level controlled Vital Signs Assessment: post-procedure vital signs reviewed and stable Respiratory status: spontaneous breathing, nonlabored ventilation, respiratory function stable and patient connected to nasal cannula oxygen Cardiovascular status: blood pressure returned to baseline and stable Postop Assessment: no apparent nausea or vomiting Anesthetic complications: no   No notable events documented.   Last Vitals:  Vitals:   02/13/21 1514 02/13/21 1528  BP: (!) 99/55 (!) 106/43  Pulse: 61 (!) 59  Resp: 12 14  Temp: 36.7 C (!) 36.1 C  SpO2: 96% 96%    Last Pain:  Vitals:   02/13/21 1528  TempSrc: Temporal  PainSc: 0-No pain                 Precious Haws Kristena Wilhelmi

## 2021-02-16 ENCOUNTER — Encounter: Payer: Self-pay | Admitting: General Surgery

## 2021-02-17 LAB — SURGICAL PATHOLOGY

## 2021-02-22 ENCOUNTER — Other Ambulatory Visit: Payer: Self-pay | Admitting: Obstetrics & Gynecology

## 2021-02-22 DIAGNOSIS — R928 Other abnormal and inconclusive findings on diagnostic imaging of breast: Secondary | ICD-10-CM

## 2021-02-24 ENCOUNTER — Inpatient Hospital Stay: Payer: Managed Care, Other (non HMO)

## 2021-02-24 ENCOUNTER — Other Ambulatory Visit: Payer: Self-pay

## 2021-02-24 ENCOUNTER — Encounter: Payer: Self-pay | Admitting: Licensed Clinical Social Worker

## 2021-02-24 ENCOUNTER — Inpatient Hospital Stay: Payer: Managed Care, Other (non HMO) | Attending: Obstetrics and Gynecology | Admitting: Licensed Clinical Social Worker

## 2021-02-24 DIAGNOSIS — Z808 Family history of malignant neoplasm of other organs or systems: Secondary | ICD-10-CM

## 2021-02-24 DIAGNOSIS — Z803 Family history of malignant neoplasm of breast: Secondary | ICD-10-CM

## 2021-02-24 DIAGNOSIS — Z8051 Family history of malignant neoplasm of kidney: Secondary | ICD-10-CM | POA: Insufficient documentation

## 2021-02-24 DIAGNOSIS — F17210 Nicotine dependence, cigarettes, uncomplicated: Secondary | ICD-10-CM

## 2021-02-24 DIAGNOSIS — Z800 Family history of malignant neoplasm of digestive organs: Secondary | ICD-10-CM

## 2021-02-24 DIAGNOSIS — Z8041 Family history of malignant neoplasm of ovary: Secondary | ICD-10-CM | POA: Diagnosis not present

## 2021-02-24 NOTE — Progress Notes (Signed)
REFERRING PROVIDER: Robert Bellow, MD [place],  [place] [address]  PRIMARY PROVIDER:  [facility]  PRIMARY REASON FOR VISIT:  1. Family history of breast cancer   2. Family history of colon cancer   3. Family history of brain cancer   4. Family history of ovarian cancer   5. Family history of renal cancer      HISTORY OF PRESENT ILLNESS:   [name], a 50 y.o. female, was seen for a [facility] cancer genetics consultation at the request of Dr. Bary Castilla due to a family history of breast and ovarian cancer.  Ms. Desena presents to clinic today to discuss the possibility of a hereditary predisposition to cancer, genetic testing, and to further clarify her future cancer risks, as well as potential cancer risks for family members.   Ms. Berko is a 50 y.o. female with no personal history of cancer.    CANCER HISTORY:  Oncology History   No history exists.     RISK FACTORS:  Menarche was at age 35.  First live birth at age 50.  OCP use for approximately 1 years.  Ovaries intact: yes.  Hysterectomy: yes.  Menopausal status: postmenopausal.  HRT use: 0 years. Colonoscopy: no;  will be scheduled soon . Mammogram within the last year: yes. Number of breast biopsies: 1.- fibroadenoma Up to date with pelvic exams: yes; mass found on ovary; benign.  Past Medical History:  Diagnosis Date   A-fib [facility])    Abnormal uterine bleeding 05/15/2014   Overview:  [place]: shows 1. 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   distal LAD had myocardial  bridge which leads to mechanial compression of the LAD   Palpitations     Past Surgical History:  Procedure Laterality Date   ABDOMINAL HYSTERECTOMY     BREAST BIOPSY Right 01/23/2021   Bx showed fibroepithelial (fibroadenoma) lesion   CARDIAC CATHETERIZATION N/A 01/18/2014   Normal coronaries; myocaridial bridge; normal EF   CYSTOSCOPY  08/11/2017   Procedure: CYSTOSCOPY;  Surgeon: Gae Dry, MD;  Location: [facility] ORS;  Service: Gynecology;;   ESOPHAGOGASTRODUODENOSCOPY (EGD) WITH ESOPHAGEAL DILATION     EXCISION OF BREAST LESION Right 02/13/2021   Procedure: EXCISION OF BREAST LESION;  Surgeon: Robert Bellow, MD;  Location: [facility] ORS;  Service: General;  Laterality: Right;   LAPAROSCOPIC HYSTERECTOMY Bilateral 08/11/2017   Procedure: HYSTERECTOMY TOTAL LAPAROSCOPIC BILATERAL SALPINGECTOMY;  Surgeon: Gae Dry, MD;  Location: [facility] ORS;  Service: Gynecology;  Laterality: Bilateral;   TUBAL LIGATION      Social History   Socioeconomic History   Marital status: Divorced    Spouse name: Not on file   Number of children: Not on file   Years of education: Not on file   Highest education level: Not on file  Occupational History   Not on file  Tobacco Use   Smoking status: Every Day    Packs/day: 0.50    Years: 30.00  Pack years: 15.00    Types: Cigarettes   Smokeless tobacco: Never  Vaping Use   Vaping Use: Never used  Substance and Sexual Activity   Alcohol use: No   Drug use: No   Sexual activity: Not on file  Other Topics Concern   Not on file  Social History Narrative   Not on file   Social Determinants of Health   Financial Resource Strain: Not on file  Food Insecurity: Not on file  Transportation Needs: Not on file  Physical Activity: Not on file  Stress: Not on file  Social Connections: Not on file     FAMILY HISTORY:  We obtained a detailed, 4-generation family history.  Significant diagnoses are listed below: Family History  Problem  Relation Age of Onset   Diabetes Mother    Heart failure Mother    Breast cancer Mother 11   Brain cancer Maternal Aunt    Ovarian cancer Maternal Grandmother 57   Rectal cancer Maternal Grandfather    Hematuria Neg Hx    Ms. Chaires has 2 sons and 2 daughters, no cancers. She has 1 full brother, passed of cirrhosis of liver at 73. She has 1 maternal half sister living, no history of cancer.  Ms. Rayo mother had breast cancer in her 31s the first time, and then again at 82. She has not had genetic testing. She also has cardiomyopathy. Patient had 2 maternal uncles, 1 aunt. Her aunt died of brain cancer at 8. No cancers in cousins. Maternal grandmother had ovarian cancer and died in her 40s. Her mother, patient's great grandmother, had metastatic cancer in her 43s that spread to uterus/ovaries. Patient's maternal grandfather had renal cancer and died in his 71s.  Ms. Kunde father had 2 cancerous polyps removed at 53. Patient had 2 paternal uncles, 1 aunt, no cancers for them or for paternal cousins. Paternal grandmother and grandfather both passed over 78, no cancers.  Ms. Morissette is unaware of previous family history of genetic testing for hereditary cancer risks. Patient's maternal ancestors are of unknown/Irish descent, and paternal ancestors are of unknown/Irish descent. There is no reported Ashkenazi Jewish ancestry. There is no known consanguinity.    GENETIC COUNSELING ASSESSMENT: Ms. Bourget is a 50 y.o. female with a family history of breast and ovarian cancer which is somewhat suggestive of a hereditary cancer syndrome and predisposition to cancer. We, therefore, discussed and recommended the following at today's visit.   DISCUSSION: We discussed that approximately 5-10% of breast cancer is hereditary. Most cases of hereditary breast and ovarian cancer are associated with BRCA1/BRCA2 genes, although there are other genes associated with hereditary cancer as well including  genes associated with other cancers we see in her family such as renal, brain and colon. Cancers and risks are gene specific.  We discussed that testing is beneficial for several reasons including knowing about other cancer risks, identifying potential screening and risk-reduction options that may be appropriate, and to understand if other family members could be at risk for cancer and allow them to undergo genetic testing.   We reviewed the characteristics, features and inheritance patterns of hereditary cancer syndromes. We also discussed genetic testing, including the appropriate family members to test, the process of testing, insurance coverage and turn-around-time for results. We discussed the implications of a negative, positive and/or variant of uncertain significant result. We recommended Ms. Rigel pursue genetic testing for the [facility] CancerNext-Expanded+RNA gene panel.   The CancerNext-Expanded + RNAinsight gene panel offered by [facility] and includes sequencing and rearrangement analysis for the following 77 genes: IP, ALK, APC*, ATM*, AXIN2, BAP1, BARD1, BLM, BMPR1A, BRCA1*, BRCA2*, BRIP1*, CDC73, CDH1*,CDK4, CDKN1B, CDKN2A, CHEK2*, CTNNA1, DICER1, FANCC, FH, FLCN, GALNT12, KIF1B, LZTR1, MAX, MEN1, MET, MLH1*, MSH2*, MSH3, MSH6*, MUTYH*, NBN, NF1*, NF2, NTHL1, PALB2*, PHOX2B, PMS2*, POT1, PRKAR1A, PTCH1, PTEN*, RAD51C*, RAD51D*,RB1, RECQL, RET, SDHA, SDHAF2, SDHB, SDHC, SDHD, SMAD4, SMARCA4, SMARCB1, SMARCE1, STK11, SUFU, TMEM127, TP53*,TSC1, TSC2, VHL and XRCC2 (sequencing and deletion/duplication); EGFR, EGLN1, HOXB13, KIT, MITF, PDGFRA, POLD1 and POLE (sequencing only); EPCAM and GREM1 (deletion/duplication only).  Based on Ms. Lacko's family history of cancer, she meets medical criteria for genetic testing. Despite that she meets criteria, she may still have an out of pocket cost. We discussed that if her out of pocket cost for testing is over $100, the laboratory will call and  confirm whether she wants to proceed with testing.  If the out of pocket cost of testing is less than $100 she will be billed by the genetic testing laboratory.   PLAN: After considering the risks, benefits, and limitations, Ms. Fuentes provided informed consent to pursue genetic testing and the blood sample was sent to [facility] for analysis of the CancerNext-Expanded+RNA panel. Results should be available within approximately 2-3 weeks' time, at which point they will be disclosed by telephone to Ms. Grossi, as will any additional recommendations warranted by these results. Ms. Capili will receive a summary of her genetic counseling visit and a copy of her results once available. This information will also be available in Epic.   Ms. Pfefferkorn questions were answered to her satisfaction today. Our contact information was provided should additional questions or concerns arise. Thank you for the referral and allowing [place] to share in the care of your patient.   Faith Rogue, MS, [facility] Genetic Counselor [email or web address] Phone: [phone]  The patient was seen for a total of 25 minutes in face-to-face genetic counseling.  Patient was seen alone. Dr. Grayland Ormond was available for discussion regarding this case.   _______________________________________________________________________ For Office Staff:  Number of people involved in session: 1 Was an Intern/ student involved with case: no

## 2021-03-10 ENCOUNTER — Encounter: Payer: Self-pay | Admitting: Licensed Clinical Social Worker

## 2021-03-10 ENCOUNTER — Telehealth: Payer: Self-pay | Admitting: Licensed Clinical Social Worker

## 2021-03-10 NOTE — Telephone Encounter (Signed)
Revealed positive genetic testing. PMS2 pathogenic variant identified, consistent with Lynch syndrome diagnosis. We discussed this briefly and made appointment for her to come back tomorrow, 8/3, at 11 am to discuss in detail.

## 2021-03-11 ENCOUNTER — Inpatient Hospital Stay: Payer: Managed Care, Other (non HMO) | Attending: Oncology | Admitting: Licensed Clinical Social Worker

## 2021-03-11 DIAGNOSIS — Z1509 Genetic susceptibility to other malignant neoplasm: Secondary | ICD-10-CM

## 2021-03-11 DIAGNOSIS — Z803 Family history of malignant neoplasm of breast: Secondary | ICD-10-CM

## 2021-03-11 DIAGNOSIS — Z808 Family history of malignant neoplasm of other organs or systems: Secondary | ICD-10-CM

## 2021-03-11 DIAGNOSIS — Z8041 Family history of malignant neoplasm of ovary: Secondary | ICD-10-CM | POA: Diagnosis not present

## 2021-03-11 DIAGNOSIS — Z8051 Family history of malignant neoplasm of kidney: Secondary | ICD-10-CM

## 2021-03-11 DIAGNOSIS — Z1379 Encounter for other screening for genetic and chromosomal anomalies: Secondary | ICD-10-CM | POA: Diagnosis not present

## 2021-03-11 DIAGNOSIS — Z800 Family history of malignant neoplasm of digestive organs: Secondary | ICD-10-CM

## 2021-03-11 NOTE — Progress Notes (Signed)
Genetic Test Results  HPI:  Ms. Mercer was previously seen in the [facility] due to a family history of cancer and concerns regarding a hereditary predisposition to cancer. Please refer to our prior cancer genetics clinic note for more information regarding our discussion, assessment and recommendations, at the time. Ms. Foronda recent genetic test results were disclosed to her, as were recommendations warranted by these results. These results and recommendations are discussed in more detail below.  CANCER HISTORY:  Oncology History   No history exists.    FAMILY HISTORY:  We obtained a detailed, 4-generation family history.  Significant diagnoses are listed below: Family History  Problem Relation Age of Onset   Diabetes Mother    Heart failure Mother    Breast cancer Mother 54   Brain cancer Maternal Aunt    Ovarian cancer Maternal Grandmother 57   Rectal cancer Maternal Grandfather    Hematuria Neg Hx    Ms. Petruska has 2 sons and 2 daughters, no cancers. She has 1 full brother, passed of cirrhosis of liver at 36. She has 1 maternal half sister living, no history of cancer.   Ms. Basquez mother had breast cancer in her 9s the first time, and then again at 17. She has not had genetic testing. She also has cardiomyopathy. Patient had 2 maternal uncles, 1 aunt. Her aunt died of brain cancer at 26. No cancers in cousins. Maternal grandmother had ovarian cancer and died in her 21s. Her mother, patient's great grandmother, had metastatic cancer in her 58s that spread to uterus/ovaries. Patient's maternal grandfather had renal cancer and died in his 77s.   Ms. Caton father had 2 cancerous polyps removed at 73. Patient had 2 paternal uncles, 1 aunt, no cancers for them or for paternal cousins. Paternal grandmother and grandfather both passed over 61, no cancers.   Ms. Cobarrubias is unaware of previous family history of genetic testing for hereditary cancer  risks. Patient's maternal ancestors are of unknown/Irish descent, and paternal ancestors are of unknown/Irish descent. There is no reported Ashkenazi Jewish ancestry. There is no known consanguinity.      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 The remainder of testing was negative/normal.   The CancerNext-Expanded + RNAinsight gene panel offered by [employer] and includes sequencing and rearrangement analysis for the following 77 genes: IP, ALK, APC*, ATM*, AXIN2, BAP1, BARD1, BLM, BMPR1A, BRCA1*, BRCA2*, BRIP1*, CDC73, CDH1*,CDK4, CDKN1B, CDKN2A, CHEK2*, CTNNA1, DICER1, FANCC, FH, FLCN, GALNT12, KIF1B, LZTR1, MAX, MEN1, MET, MLH1*, MSH2*, MSH3, MSH6*, MUTYH*, NBN, NF1*, NF2, NTHL1, PALB2*, PHOX2B, PMS2*, POT1, PRKAR1A, PTCH1, PTEN*, RAD51C*, RAD51D*,RB1, RECQL, RET, SDHA, SDHAF2, SDHB, SDHC, SDHD, SMAD4, SMARCA4, SMARCB1, SMARCE1, STK11, SUFU, TMEM127, TP53*,TSC1, TSC2, VHL and XRCC2 (sequencing and deletion/duplication); EGFR, EGLN1, HOXB13, KIT, MITF, PDGFRA, POLD1 and POLE (sequencing only); EPCAM and GREM1 (deletion/duplication only).  The test report has been scanned into EPIC and is located under the Molecular Pathology section of the Results Review tab.  A portion of the result report is included below for reference.     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 At this time, it is unknown if this variant is associated with increased cancer risk or if this is a normal finding, but most variants such as this get reclassified to being inconsequential. It should not be used to make medical management decisions.  With time, we suspect the lab will determine the significance of this variant, if any. If we do learn more about it we will try to contact Ms. Waterfield to discuss it further. However, it is important to  stay in touch with [place] periodically and keep the address and phone number up to date.  DISCUSSION: PMS2 (Lynch syndrome)   We discussed the cancers, inheritance, management asscociated with PMS2 and the importance of telling family members about this result.   Clinical condition Lynch syndrome is characterized by an increased risk of colorectal cancer as well as cancers of the endometrium, ovary, prostate, stomach, small intestine, hepatobiliary tract, urinary tract, pancreas and brain (PMID: 0814481, [id number], [id number]). It is caused by mutations in MLH1, MSH2, MSH6, PMS2 and EPCAM genes.   Inheritance Lynch syndrome has autosomal dominant inheritance. This means that an individual with a pathogenic variant has a 50% chance of passing the condition on to their offspring. Once a pathogenic mutation is detected in an individual, it is possible to identify at-risk relatives who can pursue testing for this specific familial variant.  Additionally, individuals with a pathogenic variant in one of the MMR genes (MLH1, MSH2, MSH6, PMS2) are carriers of constitutional mismatch repair deficiency (CMMR-D). CMMR-D is a childhood-onset cancer predisposition syndrome that can present with hematological malignancies, cancers of the brain and central nervous system, Lynch syndrome-associated cancers (colon, uterine, small bowel, urinary tract), embryonic tumors, and sarcomas.  For there to be a risk of CMMR-D in offspring, an individual and their partner would each have to have a single pathogenic variant in the same MMR gene; in such a case, the risk of having an affected child is 25%.  Management:  From NCCN v1.2022    Colorectal cancer -High-quality colonoscopy at 30-35 or 2-5 years prior to earliest colon cancer if it is diagnosed before age 39 and repeat every 1-2 years -There are data demonstrating that use of 600 mg/daily of aspirin for at least 2 years decreases CRC risk in LS, decision to use aspirin  should be individualized  Ms. Dalporto is in the process of having her colonoscopy scheduled with Dr. Bary Castilla.   Endometrial cancer -PMS2 carriers appear to be at only modestly increased risk of endometrial cancer compared to other Lynch syndrome genes -Women should be educated on prompt reporting and evaluation of abnormal uterine bleeding/postmenopausal bleeding, evaluation should include endometrial biopsy -Hysterectomy has not been shown to reduce endometrial cancer mortality, but can reduce incidence of endometrial cancer. Therefore hysterectomy can be considered, timing can be individualized. -Endometrial cancer screening does not have proven benefit in women with LS, however endometrial biopsy is both highly sensitive and specific, screening via endometrial biopsy can be considered every 1-2 years at age 59-35 can be considered. -Transvaginal ultrasound to screen for endometrial cancer in postmenopausal women has not been shown to be sufficiently sensitive or specific as to support a positive recommendation, but may be considered at clinician's discretion.   Ms. Correia has had a hysterectomy.   Ovarian cancer -Insufficient evidence exists to make a recommendation for RRSO for PMS2 pathogenic variant carriers -PMS2 carriers appear to be at no greater than average risk for ovarian cancer -Bilateral salpingo-oophorectomy may reduce incidence of ovarian cancer. The decision to have BSO and timing should be individualized -Since there is no effective screening for ovarian cancer, women should be educated on symptoms that may be associated such as pelvic or abdominal pain, bloating, increased abdominal girth, difficulty eating, early satiety, or urinary frequency  or urgency. Symptoms that persist for several weeks and are a change from woman's baseline should prompt evaluation by physician.  -Data do not support routine ovarian cancer screening for LS. Transvaginal ultrasound may be considered,  serum CA-125 may be considered at clinician's discretion -Consider risk-reducing agents for endometrial and ovarian cancers  Urothelial cancer (Renal pelvis, ureter and/or bladder) -No clear evidence to support surveillance for urothelial cancers in LS -Surveillance may be considered in selected individuals such as family history of urothelial cancer, individuals with MSH2 mutations appear to be at higher risk -Surveillance options may include urinalysis starting at 30-35  Gastric and small bowel cancer -Consider baseline EGD with random biopsy of the proximal and distal stomach for H. Pylori, autoimmune gastritis, and intestinal metaplasia beginning at age 51-40y and surveillance EGD every 3-5 years in those with risk factors  -Stomach biopsy should be performed to assess for H. pylori  Pancreatic cancer -PMS2 carriers have not been shown to be at increased risk for pancreatic tumors -Consider pancreatic cancer screening beginning at 62 (or 10 years younger than earliest exocrine pancreatic cancer diagnosis in the family, whichever is earlier) for individuals with exocrine pancreatic cancer in >1 first or second degree relative from the same side of the family as the identified germline variant.   Prostate cancer -Insufficient evidence to recommend earlier or more frequent prostate cancer screening   Brain cancer -Patient should be educated regarding signs and symptoms of neurologic cancer   Skin -Consider skin exam every 1-2 years  These guidelines are based on current NCCN guidelines (NCCN v.1.2022).  These guidelines are subject to change and continually updated and should be directly referenced for future medical management.     An individual's cancer risk and medical management are not determined by genetic test results alone. Overall cancer risk assessment incorporates additional factors, including personal medical history, family history, and any available genetic information that  may result in a personalized plan for cancer prevention and surveillance.  Knowing if a pathogenic MSH2 variant is present is advantageous. At-risk relatives can be identified, enabling pursuit of a diagnostic evaluation. Information regarding hereditary cancer susceptibility genes is constantly evolving, and more clinically relevant data regarding ATM is likely to become available in the near future. Awareness of this cancer predisposition encourages patients and their providers to inform at-risk family members, to diligently follow condition-specific screening protocols, and to be vigilant in maintaining close and regular contact with their local genetics clinic in anticipation of new information.   FAMILY MEMBERS: It is important that all of Ms. Prindle's relatives (both men and women) know of the presence of this gene mutation. Site-specific genetic testing can sort out who in the family is at risk and who is not.   Ms. Mester children and siblings have a 50% chance to have inherited this mutation. We recommend they have genetic testing for this same mutation, as identifying the presence of this mutation would allow them to also take advantage of risk-reducing measures.   PLAN:   Ms. Gengler has identified a provider that she plans to see for follow up for her diagnosis of Lynch syndrome.  This individual is Dr. Bary Castilla.   Ms. Acero has identified a gynecologist that she plans to see for follow up for her diagnosis of Lynch syndrome.  This individual is Dr. Kenton Kingfisher.     Ms. Ensey plans to discuss these results with family and will reach out to [place] if we can be of any assistance in coordinating  genetic testing for any of relatives.    SUPPORT AND RESOURCES: Ms. Rosenburg was given patient information about the cancer registry program, Vivia Ewing, out of [employer].  The cancer registry provides a way for researchers and patients to link together.  [employer] also provides a biannual  newsletter that provides updated information about hereditary cancer genes and syndromes. Ms. Doering was also given information about [employer], a lynch syndrome hereditary cancer advocacy organization: [employer].cy  We encouraged Ms. Macintyre to remain in contact with [place] on an annual basis so we can update her personal and family histories, and let her know of advances in cancer genetics that may benefit the family. Our contact number was provided. Ms. Spilker questions were answered to her satisfaction today, and she knows she is welcome to call anytime with additional questions.   Faith Rogue, MS, [facility] Genetic Counselor [email or web address] Phone: [phone]

## 2021-08-15 ENCOUNTER — Other Ambulatory Visit: Payer: Self-pay

## 2021-08-15 ENCOUNTER — Emergency Department
Admission: EM | Admit: 2021-08-15 | Discharge: 2021-08-15 | Disposition: A | Discharge status: Home / Self Care | Payer: Managed Care, Other (non HMO) | Attending: Emergency Medicine | Admitting: Emergency Medicine

## 2021-08-15 ENCOUNTER — Emergency Department: Payer: Managed Care, Other (non HMO)

## 2021-08-15 ENCOUNTER — Encounter: Payer: Self-pay | Admitting: Intensive Care

## 2021-08-15 DIAGNOSIS — U071 COVID-19: Secondary | ICD-10-CM | POA: Diagnosis not present

## 2021-08-15 DIAGNOSIS — F17200 Nicotine dependence, unspecified, uncomplicated: Secondary | ICD-10-CM | POA: Diagnosis not present

## 2021-08-15 DIAGNOSIS — R059 Cough, unspecified: Secondary | ICD-10-CM | POA: Diagnosis present

## 2021-08-15 DIAGNOSIS — Z5321 Procedure and treatment not carried out due to patient leaving prior to being seen by health care provider: Secondary | ICD-10-CM | POA: Insufficient documentation

## 2021-08-15 LAB — CBC WITH DIFFERENTIAL/PLATELET
Abs Immature Granulocytes: 0.05 10*3/uL (ref 0.00–0.07)
Basophils Absolute: 0.1 10*3/uL (ref 0.0–0.1)
Basophils Relative: 0 %
Eosinophils Absolute: 0 10*3/uL (ref 0.0–0.5)
Eosinophils Relative: 0 %
HCT: 41.1 % (ref 36.0–46.0)
Hemoglobin: 14 g/dL (ref 12.0–15.0)
Immature Granulocytes: 0 %
Lymphocytes Relative: 11 %
Lymphs Abs: 1.5 10*3/uL (ref 0.7–4.0)
MCH: 31 pg (ref 26.0–34.0)
MCHC: 34.1 g/dL (ref 30.0–36.0)
MCV: 90.9 fL (ref 80.0–100.0)
Monocytes Absolute: 1 10*3/uL (ref 0.1–1.0)
Monocytes Relative: 7 %
Neutro Abs: 10.7 10*3/uL — ABNORMAL HIGH (ref 1.7–7.7)
Neutrophils Relative %: 82 %
Platelets: 257 10*3/uL (ref 150–400)
RBC: 4.52 MIL/uL (ref 3.87–5.11)
RDW: 12.5 % (ref 11.5–15.5)
WBC: 13.2 10*3/uL — ABNORMAL HIGH (ref 4.0–10.5)
nRBC: 0 % (ref 0.0–0.2)

## 2021-08-15 LAB — COMPREHENSIVE METABOLIC PANEL
ALT: 28 U/L (ref 0–44)
AST: 25 U/L (ref 15–41)
Albumin: 4 g/dL (ref 3.5–5.0)
Alkaline Phosphatase: 58 U/L (ref 38–126)
Anion gap: 10 (ref 5–15)
BUN: 10 mg/dL (ref 6–20)
CO2: 21 mmol/L — ABNORMAL LOW (ref 22–32)
Calcium: 9.1 mg/dL (ref 8.9–10.3)
Chloride: 102 mmol/L (ref 98–111)
Creatinine, Ser: 0.89 mg/dL (ref 0.44–1.00)
GFR, Estimated: >60 mL/min (ref >60)
Glucose, Bld: 146 mg/dL — ABNORMAL HIGH (ref 70–99)
Potassium: 3.7 mmol/L (ref 3.5–5.1)
Sodium: 133 mmol/L — ABNORMAL LOW (ref 135–145)
Total Bilirubin: 0.7 mg/dL (ref 0.3–1.2)
Total Protein: 7.9 g/dL (ref 6.5–8.1)

## 2021-08-15 LAB — LACTIC ACID, PLASMA: Lactic Acid, Venous: 1.9 mmol/L (ref 0.5–1.9)

## 2021-08-15 MED ORDER — ACETAMINOPHEN 325 MG PO TABS
650.0000 mg | ORAL_TABLET | Freq: Once | ORAL | Status: AC | PRN
  Administered 2021-08-15: 650 mg via ORAL
  Filled 2021-08-15: qty 2

## 2021-08-15 MED ORDER — ONDANSETRON 4 MG PO TBDP
4.0000 mg | ORAL_TABLET | Freq: Once | ORAL | Status: AC | PRN
  Administered 2021-08-15: 4 mg via ORAL
  Filled 2021-08-15: qty 1

## 2021-08-15 NOTE — ED Triage Notes (Signed)
Patient c/o cough, fever, body aches and sob. Patient reports her fever started Thursday. Stated she had flu on thanksgiving and diagnosed with covid during Christmas.

## 2021-08-15 NOTE — ED Notes (Signed)
Pt reported to front desk registration that she was leaving

## 2021-11-03 ENCOUNTER — Other Ambulatory Visit: Payer: Self-pay | Admitting: Family Medicine

## 2021-11-03 DIAGNOSIS — J189 Pneumonia, unspecified organism: Secondary | ICD-10-CM

## 2021-11-09 ENCOUNTER — Other Ambulatory Visit: Payer: Managed Care, Other (non HMO)

## 2021-11-09 ENCOUNTER — Other Ambulatory Visit: Payer: Self-pay | Admitting: Family Medicine

## 2021-11-09 DIAGNOSIS — R10823 Right lower quadrant rebound abdominal tenderness: Secondary | ICD-10-CM

## 2021-11-09 DIAGNOSIS — M545 Low back pain, unspecified: Secondary | ICD-10-CM

## 2021-11-10 ENCOUNTER — Ambulatory Visit
Admission: RE | Admit: 2021-11-10 | Discharge: 2021-11-10 | Disposition: A | Discharge status: Home / Self Care | Payer: Managed Care, Other (non HMO) | Source: Ambulatory Visit | Attending: Family Medicine | Admitting: Family Medicine

## 2021-11-10 DIAGNOSIS — M545 Low back pain, unspecified: Secondary | ICD-10-CM

## 2021-11-10 DIAGNOSIS — R10823 Right lower quadrant rebound abdominal tenderness: Secondary | ICD-10-CM

## 2021-11-10 MED ORDER — IOPAMIDOL (ISOVUE-300) INJECTION 61%
100.0000 mL | Freq: Once | INTRAVENOUS | Status: AC | PRN
  Administered 2021-11-10: 100 mL via INTRAVENOUS

## 2021-11-26 ENCOUNTER — Inpatient Hospital Stay: Admission: RE | Admit: 2021-11-26 | Payer: Managed Care, Other (non HMO) | Source: Ambulatory Visit

## 2022-01-29 ENCOUNTER — Encounter: Admission: RE | Payer: Self-pay | Source: Home / Self Care

## 2022-01-29 ENCOUNTER — Ambulatory Visit
Admission: RE | Admit: 2022-01-29 | Payer: Managed Care, Other (non HMO) | Source: Home / Self Care | Admitting: General Surgery

## 2022-01-29 SURGERY — COLONOSCOPY
Anesthesia: General

## 2022-04-03 ENCOUNTER — Encounter: Payer: Self-pay | Admitting: Emergency Medicine

## 2022-04-03 ENCOUNTER — Emergency Department: Payer: Managed Care, Other (non HMO)

## 2022-04-03 ENCOUNTER — Other Ambulatory Visit: Payer: Self-pay

## 2022-04-03 ENCOUNTER — Emergency Department
Admission: EM | Admit: 2022-04-03 | Discharge: 2022-04-03 | Discharge status: Against Medical Advice | Payer: Managed Care, Other (non HMO) | Attending: Emergency Medicine | Admitting: Emergency Medicine

## 2022-04-03 DIAGNOSIS — R1110 Vomiting, unspecified: Secondary | ICD-10-CM | POA: Insufficient documentation

## 2022-04-03 DIAGNOSIS — Z5321 Procedure and treatment not carried out due to patient leaving prior to being seen by health care provider: Secondary | ICD-10-CM | POA: Insufficient documentation

## 2022-04-03 DIAGNOSIS — R002 Palpitations: Secondary | ICD-10-CM | POA: Insufficient documentation

## 2022-04-03 LAB — CBC
HCT: 41 % (ref 36.0–46.0)
Hemoglobin: 13.8 g/dL (ref 12.0–15.0)
MCH: 30.4 pg (ref 26.0–34.0)
MCHC: 33.7 g/dL (ref 30.0–36.0)
MCV: 90.3 fL (ref 80.0–100.0)
Platelets: 277 10*3/uL (ref 150–400)
RBC: 4.54 MIL/uL (ref 3.87–5.11)
RDW: 12.5 % (ref 11.5–15.5)
WBC: 8.7 10*3/uL (ref 4.0–10.5)
nRBC: 0 % (ref 0.0–0.2)

## 2022-04-03 LAB — BASIC METABOLIC PANEL
Anion gap: 7 (ref 5–15)
BUN: 15 mg/dL (ref 6–20)
CO2: 21 mmol/L — ABNORMAL LOW (ref 22–32)
Calcium: 9 mg/dL (ref 8.9–10.3)
Chloride: 110 mmol/L (ref 98–111)
Creatinine, Ser: 0.88 mg/dL (ref 0.44–1.00)
GFR, Estimated: >60 mL/min (ref >60)
Glucose, Bld: 121 mg/dL — ABNORMAL HIGH (ref 70–99)
Potassium: 3.7 mmol/L (ref 3.5–5.1)
Sodium: 138 mmol/L (ref 135–145)

## 2022-04-03 LAB — TROPONIN I (HIGH SENSITIVITY): Troponin I (High Sensitivity): 3 ng/L (ref <18)

## 2022-04-03 NOTE — ED Triage Notes (Signed)
Pt reports she woke up from her sleep with palpitations, pt vomited once. Pt reports she is supposed to have surgery for same symptoms in November. Pt denies any chest pain.

## 2022-04-03 NOTE — ED Notes (Signed)
Pt seen walking out of ER with daughter.  Pt alert and in NAD while ambulating out.

## 2022-04-21 HISTORY — PX: SVT ABLATION: EP1225

## 2022-08-13 ENCOUNTER — Emergency Department: Payer: Managed Care, Other (non HMO)

## 2022-08-13 ENCOUNTER — Encounter: Payer: Self-pay | Admitting: Emergency Medicine

## 2022-08-13 ENCOUNTER — Other Ambulatory Visit: Payer: Self-pay

## 2022-08-13 ENCOUNTER — Emergency Department
Admission: EM | Admit: 2022-08-13 | Discharge: 2022-08-13 | Disposition: A | Discharge status: Home / Self Care | Payer: Managed Care, Other (non HMO) | Attending: Emergency Medicine | Admitting: Emergency Medicine

## 2022-08-13 DIAGNOSIS — I10 Essential (primary) hypertension: Secondary | ICD-10-CM | POA: Diagnosis not present

## 2022-08-13 DIAGNOSIS — J45909 Unspecified asthma, uncomplicated: Secondary | ICD-10-CM | POA: Diagnosis not present

## 2022-08-13 DIAGNOSIS — I4891 Unspecified atrial fibrillation: Secondary | ICD-10-CM | POA: Insufficient documentation

## 2022-08-13 DIAGNOSIS — G4483 Primary cough headache: Secondary | ICD-10-CM | POA: Diagnosis not present

## 2022-08-13 DIAGNOSIS — J069 Acute upper respiratory infection, unspecified: Secondary | ICD-10-CM | POA: Diagnosis not present

## 2022-08-13 DIAGNOSIS — Z1152 Encounter for screening for COVID-19: Secondary | ICD-10-CM | POA: Diagnosis not present

## 2022-08-13 DIAGNOSIS — R059 Cough, unspecified: Secondary | ICD-10-CM | POA: Diagnosis present

## 2022-08-13 LAB — RESP PANEL BY RT-PCR (RSV, FLU A&B, COVID)  RVPGX2
Influenza A by PCR: NEGATIVE
Influenza B by PCR: NEGATIVE
Resp Syncytial Virus by PCR: NEGATIVE
SARS Coronavirus 2 by RT PCR: NEGATIVE

## 2022-08-13 MED ORDER — ONDANSETRON 4 MG PO TBDP: 4.0000 mg | ORAL_TABLET | Freq: Three times a day (TID) | ORAL | 0 refills | Status: DC | PRN

## 2022-08-13 MED ORDER — IPRATROPIUM-ALBUTEROL 0.5-2.5 (3) MG/3ML IN SOLN: 3.0000 mL | RESPIRATORY_TRACT | 0 refills | Status: AC | PRN

## 2022-08-13 MED ORDER — HYDROCOD POLI-CHLORPHE POLI ER 10-8 MG/5ML PO SUER: 5.0000 mL | Freq: Two times a day (BID) | ORAL | 0 refills | Status: DC | PRN

## 2022-08-13 NOTE — ED Notes (Signed)
See triage note  Presents with cough for 3 days   States she was exposed the the flu  Afebrile on arrival

## 2022-08-13 NOTE — Discharge Instructions (Signed)
Follow-up with your primary care provider if any continued problems or concerns.  A prescription for cough medication and solution for your nebulizer machine was sent to the pharmacy.  As we discussed do not take the cough medication and drive or operate machinery as it could cause drowsiness and increase your risk for injury.  Continue to take Tylenol if needed for headache or bodyaches.  Increase fluids to stay hydrated.

## 2022-08-13 NOTE — ED Provider Notes (Signed)
[facility] Provider Note    Event Date/Time   First MD Initiated Contact with Patient 08/13/22 [phone]     (approximate)   History   Cough   HPI  Nicole Horton is a 52 y.o. female   presents to the ED with complaint of cough for the last 3 days.  Patient also is concerned because she last evening when she was coughing she began having a headache posteriorly.  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  Patient denies any visual changes, nausea or vomiting.  Patient states that her mother did have the flu and pneumonia and patient was with her in hospice care until she passed away Christmas eve.  Patient has a history of hypertension, asthma, GERD and A-fib.      Physical Exam   Triage Vital Signs: ED Triage Vitals  Enc Vitals Group     BP 08/13/22 0846 104/67     Pulse Rate 08/13/22 0846 81     Resp 08/13/22 0846 18     Temp 08/13/22 0846 98.1 F (36.7 C)     Temp src --      SpO2 08/13/22 0846 94 %     Weight 08/13/22 0849 157 lb (71.2 kg)     Height 08/13/22 0849 '5\' 1"'$  (1.549 m)     Head Circumference --      Peak Flow --      Pain Score 08/13/22 0848 10     Pain Loc --      Pain Edu? --      Excl. in [place]? --     Most recent vital signs: Vitals:   08/13/22 0846 08/13/22 1130  BP: 104/67 110/70  Pulse: 81 78  Resp: 18 18  Temp: 98.1 F (36.7 C)   SpO2: 94% 96%     General: Awake, no distress.  Alert, talkative, able to talk in complete sentences without any difficulty.  Cranial nerves II through XII grossly intact.  Speech is normal.  Normal gait noted. CV:  Good peripheral perfusion.  Resp:  Normal effort.  Lungs are clear bilaterally, no wheezing at present. Abd:  No distention.  Other:     ED Results / Procedures / Treatments   Labs (all labs ordered are listed, but only abnormal results are displayed) Labs Reviewed  RESP PANEL  BY RT-PCR (RSV, FLU A&B, COVID)  [id number]      RADIOLOGY  Chest x-ray images were reviewed and interpreted as negative.  Radiology official report is negative for acute cardiopulmonary disease.  52 year old female presents to the ED with complaint of cough for the last 3 days.  Patient has a history of asthma and has medication at home but does not have any solution for her nebulizer machine.  Patient also states that last evening she was coughing and after this began having a headache.  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  Both chest x-ray and CT scan were reassuring.  Was also made aware that her COVID, influenza and RSV test were negative.  A prescription for Tussionex was sent to the pharmacy along with a prescription for Zofran if needed for nausea.  Patient is to increase fluids to stay hydrated and also help with the mucus secretions.  A prescription for DuoNeb solution  for her nebulizer machine was also sent to use as needed.  She is to follow-up with her PCP if any continued problems and may return to work on Monday as scheduled.   PROCEDURES:  Critical Care performed:   Procedures   MEDICATIONS ORDERED IN ED: Medications - No data to display   IMPRESSION / MDM / [employer] / ED COURSE  I reviewed the triage vital signs and the nursing notes.   Differential diagnosis includes, but is not limited to, COVID, influenza, RSV, bronchitis, pneumonia, exacerbation of asthma.  52 year old female presents to the ED with complaint of cough for the last 3 days and concerns of possible bronchitis/pneumonia.  Patient was recently exposed to her mother who was in hospice care and had the flu that developed into pneumonia.  Patient also was concerned about a posterior headache that started after coughing last evening and had already talked to Dr. Manuella Ghazi who is a neurologist at [facility] about this who suggested a CT  scan of her head to evaluate her headache.  Chest x-ray and CT scan were reassuring to the patient as they were both negative.  Respiratory panel was negative for COVID, influenza and RSV.  Patient was made aware that most likely this is a viral upper respiratory infection and to continue with her inhalers as prescribed by her PCP.  A prescription for Tussionex, Zofran and DuoNeb was sent to the pharmacy for her to use at home.  Patient is to return to the emergency department if any severe worsening of her symptoms.      Patient's presentation is most consistent with acute complicated illness / injury requiring diagnostic workup.  FINAL CLINICAL IMPRESSION(S) / ED DIAGNOSES   Final diagnoses:  Viral URI with cough  Headache after cough     Rx / DC Orders   ED Discharge Orders          Ordered    chlorpheniramine-HYDROcodone (TUSSIONEX) 10-8 MG/5ML  Every 12 hours PRN        08/13/22 1123    ipratropium-albuterol (DUONEB) 0.5-2.5 (3) MG/3ML SOLN  Every 4 hours PRN        08/13/22 1123    ondansetron (ZOFRAN-ODT) 4 MG disintegrating tablet  Every 8 hours PRN        08/13/22 1326             Note:  This document was prepared using Dragon voice recognition software and may include unintentional dictation errors.   Johnn Hai, PA-C 08/13/22 1420    Vanessa , MD 08/14/22 [phone]

## 2022-08-13 NOTE — ED Triage Notes (Addendum)
PT states coming in with a cough for 3 days and last night started to have posterior neck pain/head pain from the coughing that started last night at 930pm. Pt states her mother did have the flu.

## 2022-09-08 ENCOUNTER — Other Ambulatory Visit: Payer: Self-pay | Admitting: Obstetrics and Gynecology

## 2022-09-08 DIAGNOSIS — Z1231 Encounter for screening mammogram for malignant neoplasm of breast: Secondary | ICD-10-CM

## 2022-12-10 ENCOUNTER — Ambulatory Visit
Admission: RE | Admit: 2022-12-10 | Discharge: 2022-12-10 | Disposition: A | Discharge status: Home / Self Care | Payer: Managed Care, Other (non HMO) | Source: Ambulatory Visit | Attending: Obstetrics and Gynecology | Admitting: Obstetrics and Gynecology

## 2022-12-10 DIAGNOSIS — Z1231 Encounter for screening mammogram for malignant neoplasm of breast: Secondary | ICD-10-CM | POA: Diagnosis present

## 2022-12-22 ENCOUNTER — Other Ambulatory Visit: Payer: Self-pay | Admitting: Podiatry

## 2022-12-22 DIAGNOSIS — M25871 Other specified joint disorders, right ankle and foot: Secondary | ICD-10-CM

## 2023-02-04 ENCOUNTER — Ambulatory Visit: Payer: Managed Care, Other (non HMO)

## 2023-06-07 ENCOUNTER — Other Ambulatory Visit: Payer: Self-pay | Admitting: Neurology

## 2023-06-07 DIAGNOSIS — M7918 Myalgia, other site: Secondary | ICD-10-CM

## 2023-06-08 ENCOUNTER — Ambulatory Visit
Admission: RE | Admit: 2023-06-08 | Discharge: 2023-06-08 | Disposition: A | Discharge status: Home / Self Care | Payer: Managed Care, Other (non HMO) | Source: Ambulatory Visit | Attending: Neurology | Admitting: Neurology

## 2023-06-08 DIAGNOSIS — M7918 Myalgia, other site: Secondary | ICD-10-CM

## 2023-06-08 MED ORDER — GADOPICLENOL 0.5 MMOL/ML IV SOLN
7.5000 mL | Freq: Once | INTRAVENOUS | Status: AC | PRN
  Administered 2023-06-08: 7.5 mL via INTRAVENOUS

## 2023-06-13 NOTE — Progress Notes (Unsigned)
Referring Physician:  Lonell Face, MD [phone] [facility] [address] [facility] [place],  [place] [address]  Primary Physician:  Wilford Corner, PA-C  History of Present Illness: 06/14/2023 Nicole Horton has a history of HTN, GERD, obesity, RLS.   Referral from neurology Sherryll Burger) for cervical DDD and severe central stenosis.   4 week history of constant neck pain with radiation to left arm into her hand. No known injury- she woke up with pain and it got progressively worse. She has numbness and tingling in left arm. Pain is worse with moving her arm. She has weakness in left arm, she is dropping things. She has some dexterity issues as well. No balance issues.   No relief with prednisone taper or neurontin (made her loopy). No relief with mobic, motrin, and flexeril.   Bowel/Bladder Dysfunction: none  Conservative measures:  Physical therapy: has not participated in  Multimodal medical therapy including regular antiinflammatories: tylenol, motrin, flexeril, meloxicam Injections: has not received epidural steroid injections for her neck. She has gotten a trigger point injections from Dr. Sherryll Burger on 06/02/23  Past Surgery: no prior spinal surgeries   Nicole Horton has symptoms of cervical myelopathy. She has weakness and dexterity issues. No balance issues.   The symptoms are causing a significant impact on the patient's life.   Review of Systems:  A 10 point review of systems is negative, except for the pertinent positives and negatives detailed in the HPI.  Past Medical History: Past Medical History:  Diagnosis Date   A-fib (HCC)    Abnormal uterine bleeding 05/15/2014   Overview:  [place]: shows 1. 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   Palpitations     Past Surgical History: Past Surgical History:  Procedure Laterality Date   ABDOMINAL HYSTERECTOMY     BREAST BIOPSY Right 01/23/2021   Bx showed fibroepithelial (fibroadenoma) lesion   CARDIAC CATHETERIZATION N/A 01/18/2014   Normal coronaries; myocaridial bridge; normal EF   CYSTOSCOPY  08/11/2017   Procedure: CYSTOSCOPY;  Surgeon: Nadara Mustard, MD;  Location: [facility] ORS;  Service: Gynecology;;   ESOPHAGOGASTRODUODENOSCOPY (EGD) WITH ESOPHAGEAL DILATION     EXCISION OF BREAST LESION Right 02/13/2021   Procedure: EXCISION OF BREAST LESION;  Surgeon: Earline Mayotte, MD;  Location: [facility] ORS;  Service: General;  Laterality: Right;   LAPAROSCOPIC HYSTERECTOMY Bilateral 08/11/2017   Procedure: HYSTERECTOMY TOTAL LAPAROSCOPIC BILATERAL SALPINGECTOMY;  Surgeon: Nadara Mustard, MD;  Location: [facility] ORS;  Service: Gynecology;  Laterality: Bilateral;   TUBAL LIGATION      Allergies: Allergies as of 06/14/2023 - Review Complete 06/14/2023  Allergen Reaction Noted   Other Nausea Only and Other (See Comments)    Codeine Nausea And Vomiting 05/06/2015    Medications: Outpatient Encounter Medications as of 06/14/2023  Medication Sig   acetaminophen (TYLENOL) 325 MG tablet Take 650 mg by mouth every  6 (six) hours as needed for moderate pain or headache.   albuterol (PROVENTIL HFA;VENTOLIN HFA) 108 (90 Base) MCG/ACT inhaler Inhale 1-2 puffs into the lungs every 6 (six) hours as needed for wheezing or shortness of breath.   ibuprofen (ADVIL,MOTRIN) 200 MG tablet Take 400 mg by mouth every 6 (six) hours as needed for headache or moderate pain.   ipratropium-albuterol (DUONEB) 0.5-2.5 (3) MG/3ML SOLN Take 3  mLs by nebulization every 4 (four) hours as needed (wheezing, shortness of breath or coughing).   meloxicam (MOBIC) 15 MG tablet Take 15 mg by mouth daily.   Naproxen DR 500 MG TBEC Take 1 tablet by mouth 2 (two) times daily.   ondansetron (ZOFRAN-ODT) 4 MG disintegrating tablet Take 1 tablet (4 mg total) by mouth every 8 (eight) hours as needed for nausea or vomiting.   cyclobenzaprine (FLEXERIL) 5 MG tablet Take 5 mg by mouth at bedtime.   [DISCONTINUED] chlorpheniramine-HYDROcodone (TUSSIONEX) 10-8 MG/5ML Take 5 mLs by mouth every 12 (twelve) hours as needed for cough. (Patient not taking: Reported on 06/14/2023)   [DISCONTINUED] flecainide (TAMBOCOR) 50 MG tablet Take 50 mg by mouth at bedtime. (Patient not taking: Reported on 06/14/2023)   [DISCONTINUED] metoprolol succinate (TOPROL-XL) 25 MG 24 hr tablet Take 25 mg by mouth at bedtime. (Patient not taking: Reported on 06/14/2023)   [DISCONTINUED] rosuvastatin (CRESTOR) 20 MG tablet Take 20 mg by mouth at bedtime.   [DISCONTINUED] Vitamin D, Ergocalciferol, (DRISDOL) 1.25 MG (50000 UNIT) CAPS capsule Take 50,000 Units by mouth every Friday. (Patient not taking: Reported on 06/14/2023)   No facility-administered encounter medications on file as of 06/14/2023.    Social History: Social History   Tobacco Use   Smoking status: Every Day    Current packs/day: 0.50    Average packs/day: 0.5 packs/day for 30.0 years (15.0 ttl pk-yrs)    Types: Cigarettes   Smokeless tobacco: Never  Vaping Use   Vaping status: Some Days  Substance Use Topics   Alcohol use: No   Drug use: No    Family Medical History: Family History  Problem Relation Age of Onset   Diabetes Mother    Heart failure Mother    Breast cancer Mother 52   Brain cancer Maternal Aunt    Ovarian cancer Maternal Grandmother 66   Rectal cancer Maternal Grandfather    Hematuria Neg Hx     Physical Examination: Vitals:   06/14/23 0958  BP: 130/84    General: Patient is well  developed, well nourished, calm, collected, and in no apparent distress. Attention to examination is appropriate.  She is tearful.   Respiratory: Patient is breathing without any difficulty.   NEUROLOGICAL:     Awake, alert, oriented to person, place, and time.  Speech is clear and fluent. Fund of knowledge is appropriate.   Cranial Nerves: Pupils equal round and reactive to light.  Facial tone is symmetric.    No posterior cervical tenderness. Mild tenderness in left trapezial region in scapular area.   She holds her arm splinted against her chest.   No abnormal lesions on exposed skin.   Strength: Side Biceps Triceps Deltoid Interossei Grip Wrist Ext. Wrist Flex.  R 5 5 5 5 5 5 5   L 5 4+ 5 4+ 4 4+ 4+   Side Iliopsoas Quads Hamstring PF DF EHL  R 5 5 5 5 5 5   L 5 5 5 5 5 5    Reflexes are 2+ and symmetric at the biceps, brachioradialis, patella  and achilles.   Hoffman's is absent.  Clonus is not present.   Bilateral upper and lower extremity sensation is intact to light touch, but diminished in right upper extremity compared to left.    Gait is normal.    Medical Decision Making  Imaging: MRI of cervical spine dated 06/08/23:  FINDINGS: Alignment: Straightening and reversal of the normal cervical lordosis. No significant listhesis.   Vertebrae: No acute fracture, evidence of discitis, or suspicious osseous lesion. No abnormal enhancement. Congenitally short pedicles, which narrow the AP diameter of the spinal canal.   Cord: Focal cord deformation and compression at C5-C6, with possible mildly increased T2 hyperintense signal in the right aspect ((series 109, image 21). Milder indentation and deformation at C4-C5 and C6-C7, without signal abnormality. The spinal cord is otherwise normal in signal and morphology. No abnormal spinal cord enhancement.   Posterior Fossa, vertebral arteries, paraspinal tissues: Multiple nodules in the thyroid, the largest of which  measures up to 1.7 cm (series 109, image 26). Normal vertebral artery flow voids. Normal craniocervical junction. Partial empty sella.   Disc levels:   C2-C3: No significant disc bulge. No spinal canal stenosis or neural foraminal narrowing.   C3-C4: Minimal disc bulge, with right subarticular protrusion. Mild spinal canal stenosis. No neural foraminal narrowing.   C4-C5: Moderate disc bulge with right subarticular protrusion, which indents the ventral thecal sac and spinal cord. Moderate spinal canal stenosis. No neural foraminal narrowing.   C5-C6: Moderate disc osteophyte complex with superimposed right subarticular disc osteophyte protrusion, which indents and deforms the right aspect of the spinal cord, with resulting compression. Facet and uncovertebral hypertrophy. Severe spinal canal stenosis. Mild-to-moderate bilateral neural foraminal narrowing.   C6-C7: Mild disc bulge with left subarticular disc osteophyte protrusion, which indents the left aspect of the thecal sac and spinal cord. Left-greater-than-right facet and uncovertebral hypertrophy. Moderate spinal canal stenosis. Mild-to-moderate left neural foraminal narrowing.   C7-T1: No significant disc bulge. Left uncovertebral hypertrophy. No spinal canal stenosis. Mild left neural foraminal narrowing.   IMPRESSION: 1. C5-C6 severe spinal canal stenosis with mild-to-moderate bilateral neural foraminal narrowing. A right subarticular disc osteophyte protrusion indents and deforms the right aspect of the spinal cord, with resulting compression. Possible mildly increased T2 hyperintense signal in the right aspect of the spinal cord, which may represent edema or myelomalacia. 2. C6-C7 moderate spinal canal stenosis and mild-to-moderate left neural foraminal narrowing. 3. C4-C5 moderate spinal canal stenosis. 4. C3-C4 mild spinal canal stenosis. 5. C7-T1 mild left neural foraminal narrowing. 6. Multiple nodules in the  thyroid, the largest of which measures up to 1.7 cm. If this has not previously been evaluated, a non-emergent ultrasound of the thyroid is recommended. (Reference: J Am Coll Radiol. 2015 Feb;12(2): 143-50)     Electronically Signed   By: Wiliam Ke M.D.   On: 06/08/2023 14:10   I have personally reviewed the images and agree with the above interpretation.  Assessment and Plan: Nicole Horton is a pleasant 52 y.o. female who has a 4 week history of constant neck pain with radiation to left arm into her hand. No known injury- she woke up with pain and it got progressively worse. She has numbness, weakness, and tingling in left arm. She is dropping things. She has some dexterity issues as well. No balance issues.   She has known moderate central stenosis C4-C5. Severe central stenosis C5-C6 with bilateral foraminal stenosis and question of increased T2 cord signal. Also with moderate central stenosis C6-C7 with  bilateral foraminal stenosis.   She has weakness in right arm along with dexterity issues.   Treatment options discussed with patient and following plan made:   - Recommend she follow up with Dr. Myer Haff to discuss further treatment options. As above, she has severe central stenosis and possible increased cord signal at C5-C6. She has weakness in right arm with dexterity issues.  - Note keeping her out of work until her follow up with him next week.  - Okay to continue prn flexeril. She knows this can make her sleepy.  - No relief with dose pack. Neurontin made her loopy.  - Limited prescription for ultram. Reviewed dosing and side effects. Will take with food. PMP reviewed and is appropriate.  - Follow up with PCP for thyroid nodules as instructed by neurology.   I spent a total of 30 minutes in face-to-face and non-face-to-face activities related to this patient's care today including review of outside records, review of imaging, review of symptoms, physical exam, discussion of  differential diagnosis, discussion of treatment options, and documentation.   Thank you for involving me in the care of this patient.   Drake Leach PA-C Dept. of Neurosurgery

## 2023-06-14 ENCOUNTER — Encounter: Payer: Self-pay | Admitting: Orthopedic Surgery

## 2023-06-14 ENCOUNTER — Ambulatory Visit (INDEPENDENT_AMBULATORY_CARE_PROVIDER_SITE_OTHER): Payer: Managed Care, Other (non HMO) | Admitting: Orthopedic Surgery

## 2023-06-14 VITALS — BP 130/84 | Ht 61.0 in | Wt 161.0 lb

## 2023-06-14 DIAGNOSIS — M47812 Spondylosis without myelopathy or radiculopathy, cervical region: Secondary | ICD-10-CM

## 2023-06-14 DIAGNOSIS — M4802 Spinal stenosis, cervical region: Secondary | ICD-10-CM | POA: Diagnosis not present

## 2023-06-14 DIAGNOSIS — M542 Cervicalgia: Secondary | ICD-10-CM | POA: Diagnosis not present

## 2023-06-14 DIAGNOSIS — M5412 Radiculopathy, cervical region: Secondary | ICD-10-CM

## 2023-06-14 DIAGNOSIS — G959 Disease of spinal cord, unspecified: Secondary | ICD-10-CM

## 2023-06-14 MED ORDER — TRAMADOL HCL 50 MG PO TABS: 50.0000 mg | ORAL_TABLET | Freq: Three times a day (TID) | ORAL | 0 refills | Status: DC | PRN

## 2023-06-14 NOTE — Patient Instructions (Signed)
It was so nice to see you today. Thank you so much for coming in.    You have pressure on the spinal cord in your neck and I am concerned that this may need surgery.   I want you to follow up with Dr. Myer Haff next week to discuss your options.   I sent a prescription for tramadol to your pharmacy to take only as needed for severe pain. This can make you constipated and/or sleepy. Take with food.   You can continue the flexeril at night as needed. This can make you sleepy as well.   I gave you a work note. Let me know if we need to fill out papers.  you sleepy.   Please do not hesitate to call if you have any questions or concerns. You can also message me in MyChart.   Drake Leach PA-C [phone]     The physicians and staff at [facility] at [facility] are committed to providing excellent care. You may receive a survey asking for feedback about your experience at our office. We value you your feedback and appreciate you taking the time to to fill it out. The [facility] leadership team is also available to discuss your experience in person, feel free to contact us [phone].

## 2023-06-15 ENCOUNTER — Ambulatory Visit: Payer: Managed Care, Other (non HMO) | Admitting: Orthopedic Surgery

## 2023-06-17 NOTE — Progress Notes (Unsigned)
Referring Physician:  Wilford Corner, PA-C [address] [place],  [place] [address]  Primary Physician:  Wilford Corner, PA-C  History of Present Illness: 06/17/2023 Nicole Horton is here today with a chief complaint of ***  Nicole Horton has ***no symptoms of cervical myelopathy.  The symptoms are causing a significant impact on the patient's life.   I have utilized the care everywhere function in epic to review the outside records available from external health systems.  History of Present Illness: 06/14/2023 Notes from [facility] PA-C Ms. Herlinda Marmon has a history of HTN, GERD, obesity, RLS.    Referral from neurology Sherryll Burger) for cervical DDD and severe central stenosis.    4 week history of constant neck pain with radiation to left arm into her hand. No known injury- she woke up with pain and it got progressively worse. She has numbness and tingling in left arm. Pain is worse with moving her arm. She has weakness in left arm, she is dropping things. She has some dexterity issues as well. No balance issues.    No relief with prednisone taper or neurontin (made her loopy). No relief with mobic, motrin, and flexeril.    Bowel/Bladder Dysfunction: none   Conservative measures:  Physical therapy: has not participated in  Multimodal medical therapy including regular antiinflammatories: tylenol, motrin, flexeril, meloxicam Injections: has not received epidural steroid injections for her neck. She has gotten a trigger point injections from Dr. Sherryll Burger on 06/02/23   Past Surgery: no prior spinal surgeries    [name] has symptoms of cervical myelopathy. She has weakness and dexterity issues. No balance issues.   Review of Systems:  A 10 point review of systems is negative, except for the pertinent positives and negatives detailed in the HPI.  Past Medical History: Past Medical History:  Diagnosis Date   A-fib (HCC)    Abnormal  uterine bleeding 05/15/2014   Overview:  [place]: shows 1. 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   Palpitations     Past Surgical History: Past Surgical History:  Procedure Laterality Date   ABDOMINAL HYSTERECTOMY     BREAST BIOPSY Right 01/23/2021   Bx showed fibroepithelial (fibroadenoma) lesion   CARDIAC CATHETERIZATION N/A 01/18/2014   Normal coronaries; myocaridial bridge; normal EF   CYSTOSCOPY  08/11/2017   Procedure: CYSTOSCOPY;  Surgeon: Nadara Mustard, MD;  Location: [facility] ORS;  Service: Gynecology;;   ESOPHAGOGASTRODUODENOSCOPY (EGD) WITH ESOPHAGEAL DILATION     EXCISION OF BREAST LESION Right 02/13/2021   Procedure: EXCISION OF BREAST LESION;  Surgeon: Earline Mayotte, MD;  Location: [facility] ORS;  Service: General;  Laterality: Right;   LAPAROSCOPIC HYSTERECTOMY Bilateral 08/11/2017   Procedure: HYSTERECTOMY TOTAL LAPAROSCOPIC BILATERAL SALPINGECTOMY;  Surgeon: Nadara Mustard, MD;  Location: [facility] ORS;  Service: Gynecology;  Laterality: Bilateral;   TUBAL LIGATION  Allergies: Allergies as of 06/21/2023 - Review Complete 06/14/2023  Allergen Reaction Noted   Other Nausea Only and Other (See Comments)    Codeine Nausea And Vomiting 05/06/2015    Medications:  Current Outpatient Medications:    acetaminophen (TYLENOL) 325 MG tablet, Take 650 mg by mouth every 6 (six) hours as needed for moderate pain or headache., Disp: , Rfl:    albuterol (PROVENTIL HFA;VENTOLIN HFA) 108 (90 Base)  MCG/ACT inhaler, Inhale 1-2 puffs into the lungs every 6 (six) hours as needed for wheezing or shortness of breath., Disp: 1 Inhaler, Rfl: 0   ipratropium-albuterol (DUONEB) 0.5-2.5 (3) MG/3ML SOLN, Take 3 mLs by nebulization every 4 (four) hours as needed (wheezing, shortness of breath or coughing)., Disp: 360 mL, Rfl: 0   ondansetron (ZOFRAN-ODT) 4 MG disintegrating tablet, Take 1 tablet (4 mg total) by mouth every 8 (eight) hours as needed for nausea or vomiting., Disp: 12 tablet, Rfl: 0   traMADol (ULTRAM) 50 MG tablet, Take 1 tablet (50 mg total) by mouth every 8 (eight) hours as needed for severe pain (pain score 7-10)., Disp: 15 tablet, Rfl: 0  Social History: Social History   Tobacco Use   Smoking status: Every Day    Current packs/day: 0.50    Average packs/day: 0.5 packs/day for 30.0 years (15.0 ttl pk-yrs)    Types: Cigarettes   Smokeless tobacco: Never  Vaping Use   Vaping status: Some Days  Substance Use Topics   Alcohol use: No   Drug use: No    Family Medical History: Family History  Problem Relation Age of Onset   Diabetes Mother    Heart failure Mother    Breast cancer Mother 3   Brain cancer Maternal Aunt    Ovarian cancer Maternal Grandmother 65   Rectal cancer Maternal Grandfather    Hematuria Neg Hx     Physical Examination: There were no vitals filed for this visit.  General: Patient is in no apparent distress. Attention to examination is appropriate.  Neck:   Supple.  Full range of motion.  Respiratory: Patient is breathing without any difficulty.   NEUROLOGICAL:     Awake, alert, oriented to person, place, and time.  Speech is clear and fluent.   Cranial Nerves: Pupils equal round and reactive to light.  Facial tone is symmetric.  Facial sensation is symmetric. Shoulder shrug is symmetric. Tongue protrusion is midline.  There is no pronator drift.  Strength: Side Biceps Triceps Deltoid Interossei Grip Wrist Ext. Wrist Flex.  R 5 5 5 5 5 5 5    L 5 5 5 5 5 5 5    Side Iliopsoas Quads Hamstring PF DF EHL  R 5 5 5 5 5 5   L 5 5 5 5 5 5    Reflexes are ***2+ and symmetric at the biceps, triceps, brachioradialis, patella and achilles.   Hoffman's is absent.   Bilateral upper and lower extremity sensation is intact to light touch.    No evidence of dysmetria noted.  Gait is normal.     Medical Decision Making  Imaging: ***  I have personally reviewed the images and agree with the above interpretation.  Assessment and Plan: Ms. Waag is a pleasant 52 y.o. female with ***    Thank you for involving me in the care of this patient.      Astaria Nanez K. Myer Haff MD, [facility]

## 2023-06-17 NOTE — H&P (View-Only) (Signed)
Referring Physician:  Wilford Corner, PA-C [id number] [place],  [place] [address]  Primary Physician:  Wilford Corner, PA-C  History of Present Illness: 06/17/2023 [name] is here today with a chief complaint of ***  Nicole Horton has ***no symptoms of cervical myelopathy.  The symptoms are causing a significant impact on the patient's life.   I have utilized the care everywhere function in epic to review the outside records available from external health systems.  History of Present Illness: 06/14/2023 Notes from [facility] PA-C Ms. Aurelie Misa has a history of HTN, GERD, obesity, RLS.    Referral from neurology Sherryll Burger) for cervical DDD and severe central stenosis.    4 week history of constant neck pain with radiation to left arm into her hand. No known injury- she woke up with pain and it got progressively worse. She has numbness and tingling in left arm. Pain is worse with moving her arm. She has weakness in left arm, she is dropping things. She has some dexterity issues as well. No balance issues.    No relief with prednisone taper or neurontin (made her loopy). No relief with mobic, motrin, and flexeril.    Bowel/Bladder Dysfunction: none   Conservative measures:  Physical therapy: has not participated in  Multimodal medical therapy including regular antiinflammatories: tylenol, motrin, flexeril, meloxicam Injections: has not received epidural steroid injections for her neck. She has gotten a trigger point injections from Dr. Sherryll Burger on 06/02/23   Past Surgery: no prior spinal surgeries    Nicole Horton has symptoms of cervical myelopathy. She has weakness and dexterity issues. No balance issues.   Review of Systems:  A 10 point review of systems is negative, except for the pertinent positives and negatives detailed in the HPI.  Past Medical History: Past Medical History:  Diagnosis Date   A-fib (HCC)    Abnormal  uterine bleeding 05/15/2014   Overview:  [place]: shows 1. 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   Palpitations     Past Surgical History: Past Surgical History:  Procedure Laterality Date   ABDOMINAL HYSTERECTOMY     BREAST BIOPSY Right 01/23/2021   Bx showed fibroepithelial (fibroadenoma) lesion   CARDIAC CATHETERIZATION N/A 01/18/2014   Normal coronaries; myocaridial bridge; normal EF   CYSTOSCOPY  08/11/2017   Procedure: CYSTOSCOPY;  Surgeon: Nadara Mustard, MD;  Location: [facility] ORS;  Service: Gynecology;;   ESOPHAGOGASTRODUODENOSCOPY (EGD) WITH ESOPHAGEAL DILATION     EXCISION OF BREAST LESION Right 02/13/2021   Procedure: EXCISION OF BREAST LESION;  Surgeon: Earline Mayotte, MD;  Location: [facility] ORS;  Service: General;  Laterality: Right;   LAPAROSCOPIC HYSTERECTOMY Bilateral 08/11/2017   Procedure: HYSTERECTOMY TOTAL LAPAROSCOPIC BILATERAL SALPINGECTOMY;  Surgeon: Nadara Mustard, MD;  Location: [facility] ORS;  Service: Gynecology;  Laterality: Bilateral;   TUBAL LIGATION  Allergies: Allergies as of 06/21/2023 - Review Complete 06/14/2023  Allergen Reaction Noted   Other Nausea Only and Other (See Comments)    Codeine Nausea And Vomiting 05/06/2015    Medications:  Current Outpatient Medications:    acetaminophen (TYLENOL) 325 MG tablet, Take 650 mg by mouth every 6 (six) hours as needed for moderate pain or headache., Disp: , Rfl:    albuterol (PROVENTIL HFA;VENTOLIN HFA) 108 (90 Base)  MCG/ACT inhaler, Inhale 1-2 puffs into the lungs every 6 (six) hours as needed for wheezing or shortness of breath., Disp: 1 Inhaler, Rfl: 0   ipratropium-albuterol (DUONEB) 0.5-2.5 (3) MG/3ML SOLN, Take 3 mLs by nebulization every 4 (four) hours as needed (wheezing, shortness of breath or coughing)., Disp: 360 mL, Rfl: 0   ondansetron (ZOFRAN-ODT) 4 MG disintegrating tablet, Take 1 tablet (4 mg total) by mouth every 8 (eight) hours as needed for nausea or vomiting., Disp: 12 tablet, Rfl: 0   traMADol (ULTRAM) 50 MG tablet, Take 1 tablet (50 mg total) by mouth every 8 (eight) hours as needed for severe pain (pain score 7-10)., Disp: 15 tablet, Rfl: 0  Social History: Social History   Tobacco Use   Smoking status: Every Day    Current packs/day: 0.50    Average packs/day: 0.5 packs/day for 30.0 years (15.0 ttl pk-yrs)    Types: Cigarettes   Smokeless tobacco: Never  Vaping Use   Vaping status: Some Days  Substance Use Topics   Alcohol use: No   Drug use: No    Family Medical History: Family History  Problem Relation Age of Onset   Diabetes Mother    Heart failure Mother    Breast cancer Mother 92   Brain cancer Maternal Aunt    Ovarian cancer Maternal Grandmother 21   Rectal cancer Maternal Grandfather    Hematuria Neg Hx     Physical Examination: There were no vitals filed for this visit.  General: Patient is in no apparent distress. Attention to examination is appropriate.  Neck:   Supple.  Full range of motion.  Respiratory: Patient is breathing without any difficulty.   NEUROLOGICAL:     Awake, alert, oriented to person, place, and time.  Speech is clear and fluent.   Cranial Nerves: Pupils equal round and reactive to light.  Facial tone is symmetric.  Facial sensation is symmetric. Shoulder shrug is symmetric. Tongue protrusion is midline.  There is no pronator drift.  Strength: Side Biceps Triceps Deltoid Interossei Grip Wrist Ext. Wrist Flex.  R 5 5 5 5 5 5 5    L 5 5 5 5 5 5 5    Side Iliopsoas Quads Hamstring PF DF EHL  R 5 5 5 5 5 5   L 5 5 5 5 5 5    Reflexes are ***2+ and symmetric at the biceps, triceps, brachioradialis, patella and achilles.   Hoffman's is absent.   Bilateral upper and lower extremity sensation is intact to light touch.    No evidence of dysmetria noted.  Gait is normal.     Medical Decision Making  Imaging: ***  I have personally reviewed the images and agree with the above interpretation.  Assessment and Plan: Ms. Granda is a pleasant 52 y.o. female with ***    Thank you for involving me in the care of this patient.      Kharee Lesesne K. Myer Haff MD, [facility] Neurosurgery

## 2023-06-21 ENCOUNTER — Other Ambulatory Visit: Payer: Self-pay

## 2023-06-21 ENCOUNTER — Encounter: Payer: Self-pay | Admitting: Neurosurgery

## 2023-06-21 ENCOUNTER — Ambulatory Visit: Payer: Managed Care, Other (non HMO) | Admitting: Orthopedic Surgery

## 2023-06-21 ENCOUNTER — Ambulatory Visit (INDEPENDENT_AMBULATORY_CARE_PROVIDER_SITE_OTHER): Payer: Managed Care, Other (non HMO) | Admitting: Neurosurgery

## 2023-06-21 VITALS — BP 130/84 | Ht 61.0 in | Wt 161.0 lb

## 2023-06-21 DIAGNOSIS — G959 Disease of spinal cord, unspecified: Secondary | ICD-10-CM | POA: Diagnosis not present

## 2023-06-21 DIAGNOSIS — M4802 Spinal stenosis, cervical region: Secondary | ICD-10-CM

## 2023-06-21 DIAGNOSIS — M5412 Radiculopathy, cervical region: Secondary | ICD-10-CM

## 2023-06-21 DIAGNOSIS — Z01818 Encounter for other preprocedural examination: Secondary | ICD-10-CM

## 2023-06-21 DIAGNOSIS — M4012 Other secondary kyphosis, cervical region: Secondary | ICD-10-CM | POA: Diagnosis not present

## 2023-06-21 DIAGNOSIS — E041 Nontoxic single thyroid nodule: Secondary | ICD-10-CM

## 2023-06-21 DIAGNOSIS — G9589 Other specified diseases of spinal cord: Secondary | ICD-10-CM

## 2023-06-21 NOTE — Patient Instructions (Signed)
Please see below for information in regards to your upcoming surgery:   Planned surgery: C4-7 anterior cervical discectomy and fusion   Surgery date: 07/01/23 at [facility] (Medical Mall: [address] [place], [place] [address]) - you will find out your arrival time the business day before your surgery.   Pre-op appointment at [facility] Pre-admit Testing: we will call you with a date/time for this. If you are scheduled for an in person appointment, Pre-admit Testing is located on the first floor of the [facility] building, [address], [address]. Please bring all prescriptions in the original prescription bottles to your appointment. During this appointment, they will advise you which medications you can take the morning of surgery, and which medications you will need to hold for surgery. Labs (such as blood work, EKG) may be done at your pre-op appointment. You are not required to fast for these labs. Should you need to change your pre-op appointment, please call Pre-admit testing at [phone].      Surgical clearance: we will send a clearance form to Debbra Riding, PA-C and Dr Juliann Pares. They may wish to see you in their office prior to signing the clearance form. If so, they may call you to schedule an appointment.     NSAIDS (Non-steroidal anti-inflammatory drugs): because you are having a fusion, please avoid taking any NSAIDS (examples: ibuprofen, motrin, aleve, naproxen, meloxicam, diclofenac) for 3 months after surgery. Celebrex is an exception and is OK to take, if prescribed. Tylenol is not an NSAID.    Common restrictions after surgery: No bending, lifting, or twisting ("BLT"). Avoid lifting objects heavier than 10 pounds for the first 6 weeks after surgery. Where possible, avoid household activities that involve lifting, bending, reaching, pushing, or pulling such as laundry, vacuuming, grocery shopping, and childcare. Try to  arrange for help from friends and family for these activities while you heal. Do not drive while taking prescription pain medication. Weeks 6 through 12 after surgery: avoid lifting more than 25 pounds.    X-rays after surgery: Because you are having a fusion: for appointments after your 2 week follow-up: please arrive at the [facility] outpatient imaging center ([address], [address], [place]) or [facility] one hour prior to your appointment for x-rays. This applies to every appointment after your 2 week follow-up. Failure to do so may result in your appointment being rescheduled.   How to contact us:  If you have any questions/concerns before or after surgery, you can reach [place] at [phone], or you can send a mychart message. We can be reached by phone or mychart 8am-4pm, Monday-Friday.  *Please note: Calls after 4pm are forwarded to a third party answering service. Mychart messages are not routinely monitored during evenings, weekends, and holidays. Please call our office to contact the answering service for urgent concerns during non-business hours.    If you have FMLA/disability paperwork, please drop it off or fax it to [phone], attention Patty.   Appointments/FMLA & disability paperwork: Joycelyn Rua, & Flonnie Hailstone Registered Nurse/Surgery scheduler: Royston Cowper Medical Assistants: Nash Mantis Physician Assistants: Joan Flores, PA-C, Manning Charity, PA-C & Drake Leach, PA-C Surgeons: Venetia Night, MD & Ernestine Mcmurray, MD

## 2023-06-24 ENCOUNTER — Inpatient Hospital Stay: Admission: RE | Admit: 2023-06-24 | Payer: Managed Care, Other (non HMO) | Source: Ambulatory Visit

## 2023-06-27 ENCOUNTER — Other Ambulatory Visit: Payer: Self-pay

## 2023-06-27 ENCOUNTER — Telehealth: Payer: Self-pay

## 2023-06-27 ENCOUNTER — Inpatient Hospital Stay
Admission: RE | Admit: 2023-06-27 | Discharge: 2023-06-27 | Disposition: A | Discharge status: Home / Self Care | Payer: Managed Care, Other (non HMO) | Source: Ambulatory Visit | Attending: Neurosurgery | Admitting: Neurosurgery

## 2023-06-27 VITALS — BP 113/65 | HR 74 | Resp 14 | Ht 61.0 in | Wt 158.0 lb

## 2023-06-27 DIAGNOSIS — Z01812 Encounter for preprocedural laboratory examination: Secondary | ICD-10-CM | POA: Insufficient documentation

## 2023-06-27 DIAGNOSIS — Z01818 Encounter for other preprocedural examination: Secondary | ICD-10-CM

## 2023-06-27 HISTORY — DX: COVID-19: U07.1

## 2023-06-27 HISTORY — DX: Pneumonia, unspecified organism: J18.9

## 2023-06-27 LAB — SURGICAL PCR SCREEN
MRSA, PCR: NEGATIVE
Staphylococcus aureus: NEGATIVE

## 2023-06-27 NOTE — Patient Instructions (Addendum)
Your procedure is scheduled on: Friday 07/01/23 To find out your arrival time, please call [phone] between 1PM - 3PM on:   Thursday 06/30/23 Report to the Registration Desk on the 1st floor of the Medical Mall. FREE Valet parking is available.  If your arrival time is 6:00 am, do not arrive before that time as the Medical Mall entrance doors do not open until 6:00 am.  REMEMBER: Instructions that are not followed completely may result in serious medical risk, up to and including death; or upon the discretion of your surgeon and anesthesiologist your surgery may need to be rescheduled.  Do not eat food after midnight the night before surgery.  No gum chewing or hard candies.  You may however, drink CLEAR liquids up to 2 hours before you are scheduled to arrive for your surgery. Do not drink anything within 2 hours of your scheduled arrival time.  Clear liquids include: - water  - apple juice without pulp - gatorade (not RED colors) - black coffee or tea (Do NOT add milk or creamers to the coffee or tea) Do NOT drink anything that is not on this list.  Type 1 and Type 2 diabetics should only drink water.  One week prior to surgery: Stop Anti-inflammatories (NSAIDS) such as Advil, Aleve, Ibuprofen, Motrin, Naproxen, Naprosyn and Aspirin based products such as Excedrin, Goody's Powder, BC Powder. You may however, continue to take Tylenol if needed for pain up until the day of surgery.  Stop ANY OVER THE COUNTER supplements and vitamins until after surgery.  Continue taking all prescribed medications.   TAKE ONLY THESE MEDICATIONS THE MORNING OF SURGERY WITH A SIP OF WATER:  May take xanax if needed  No Alcohol for 24 hours before or after surgery.  No Smoking including e-cigarettes for 24 hours before surgery.  No chewable tobacco products for at least 6 hours before surgery.  No nicotine patches on the day of surgery.  Do not use any "recreational" drugs for at least a  week (preferably 2 weeks) before your surgery.  Please be advised that the combination of cocaine and anesthesia may have negative outcomes, up to and including death. If you test positive for cocaine, your surgery will be cancelled.  On the morning of surgery brush your teeth with toothpaste and water, you may rinse your mouth with mouthwash if you wish. Do not swallow any toothpaste or mouthwash.  Use CHG Soap or wipes as directed on instruction sheet. Shower daily with CHG soap starting today 06/27/23.  Do not wear lotions, powders, or perfumes on the day of surgery.  Do not shave body hair from the neck down 48 hours before surgery.  Wear clean comfortable clothing (specific to your surgery type) to the hospital.  Do not wear jewelry, make-up, hairpins, clips or nail polish.  For welded (permanent) jewelry: bracelets, anklets, waist bands, etc.  Please have this removed prior to surgery.  If it is not removed, there is a chance that hospital personnel will need to cut it off on the day of surgery. Contact lenses, hearing aids and dentures may not be worn into surgery.  Do not bring valuables to the hospital. [facility] is not responsible for any missing/lost belongings or valuables.   Notify your doctor if there is any change in your medical condition (cold, fever, infection).  If you are being discharged the day of surgery, you will not be allowed to drive home. You will need a responsible individual to drive  you home and stay with you for 24 hours after surgery.   If you are taking public transportation, you will need to have a responsible individual with you.  If you are being admitted to the hospital overnight, leave your suitcase in the car. After surgery it may be brought to your room.  In case of increased patient census, it may be necessary for you, the patient, to continue your postoperative care in the Same Day Surgery department.  After surgery, you can help prevent  lung complications by doing breathing exercises.  Take deep breaths and cough every 1-2 hours. Your doctor may order a device called an Incentive Spirometer to help you take deep breaths. When coughing or sneezing, hold a pillow firmly against your incision with both hands. This is called "splinting." Doing this helps protect your incision. It also decreases belly discomfort.  Surgery Visitation Policy:  Patients undergoing a surgery or procedure may have two family members or support persons with them as long as the person is not COVID-19 positive or experiencing its symptoms.   Inpatient Visitation:    Visiting hours are 7 a.m. to 8 p.m. Up to four visitors are allowed at one time in a patient room. The visitors may rotate out with other people during the day. One designated support person (adult) may remain overnight.  Please call the Pre-admissions Testing Dept. at [phone] if you have any questions about these instructions.    Pre-operative 5 CHG Bath Instructions   You can play a key role in reducing the risk of infection after surgery. Your skin needs to be as free of germs as possible. You can reduce the number of germs on your skin by washing with CHG (chlorhexidine gluconate) soap before surgery. CHG is an antiseptic soap that kills germs and continues to kill germs even after washing.   DO NOT use if you have an allergy to chlorhexidine/CHG or antibacterial soaps. If your skin becomes reddened or irritated, stop using the CHG and notify one of our RNs at [phone].   Please shower with the CHG soap starting 4 days before surgery using the following schedule:     Please keep in mind the following:  DO NOT shave, including legs and underarms, starting the day of your first shower.   You may shave your face at any point before/day of surgery.  Place clean sheets on your bed the day you start using CHG soap. Use a clean washcloth (not used since being washed) for each  shower. DO NOT sleep with pets once you start using the CHG.   CHG Shower Instructions:  If you choose to wash your hair and private area, wash first with your normal shampoo/soap.  After you use shampoo/soap, rinse your hair and body thoroughly to remove shampoo/soap residue.  Turn the water OFF and apply about 3 tablespoons (45 ml) of CHG soap to a CLEAN washcloth.  Apply CHG soap ONLY FROM YOUR NECK DOWN TO YOUR TOES (washing for 3-5 minutes)  DO NOT use CHG soap on face, private areas, open wounds, or sores.  Pay special attention to the area where your surgery is being performed.  If you are having back surgery, having someone wash your back for you may be helpful. Wait 2 minutes after CHG soap is applied, then you may rinse off the CHG soap.  Pat dry with a clean towel  Put on clean clothes/pajamas   If you choose to wear lotion, please use ONLY the  CHG-compatible lotions on the back of this paper.     Additional instructions for the day of surgery: DO NOT APPLY any lotions, deodorants, cologne, or perfumes.   Put on clean/comfortable clothes.  Brush your teeth.  Ask your nurse before applying any prescription medications to the skin.      CHG Compatible Lotions   Aveeno Moisturizing lotion  Cetaphil Moisturizing Cream  Cetaphil Moisturizing Lotion  Clairol Herbal Essence Moisturizing Lotion, Dry Skin  Clairol Herbal Essence Moisturizing Lotion, Extra Dry Skin  Clairol Herbal Essence Moisturizing Lotion, Normal Skin  Curel Age Defying Therapeutic Moisturizing Lotion with Alpha Hydroxy  Curel Extreme Care Body Lotion  Curel Soothing Hands Moisturizing Hand Lotion  Curel Therapeutic Moisturizing Cream, Fragrance-Free  Curel Therapeutic Moisturizing Lotion, Fragrance-Free  Curel Therapeutic Moisturizing Lotion, Original Formula  Eucerin Daily Replenishing Lotion  Eucerin Dry Skin Therapy Plus Alpha Hydroxy Crme  Eucerin Dry Skin Therapy Plus Alpha Hydroxy Lotion   Eucerin Original Crme  Eucerin Original Lotion  Eucerin Plus Crme Eucerin Plus Lotion  Eucerin TriLipid Replenishing Lotion  Keri Anti-Bacterial Hand Lotion  Keri Deep Conditioning Original Lotion Dry Skin Formula Softly Scented  Keri Deep Conditioning Original Lotion, Fragrance Free Sensitive Skin Formula  Keri Lotion Fast Absorbing Fragrance Free Sensitive Skin Formula  Keri Lotion Fast Absorbing Softly Scented Dry Skin Formula  Keri Original Lotion  Keri Skin Renewal Lotion Keri Silky Smooth Lotion  Keri Silky Smooth Sensitive Skin Lotion  Nivea Body Creamy Conditioning Oil  Nivea Body Extra Enriched Teacher, adult education Moisturizing Lotion Nivea Crme  Nivea Skin Firming Lotion  NutraDerm 30 Skin Lotion  NutraDerm Skin Lotion  NutraDerm Therapeutic Skin Cream  NutraDerm Therapeutic Skin Lotion  ProShield Protective Hand Cream  Provon moisturizing lotion

## 2023-06-27 NOTE — Telephone Encounter (Signed)
I contacted Nicole Horton to offer her to move up her surgery from 07/01/23 to 06/29/23 due to an opening in Dr Lucienne Capers surgery schedule. She would like to discuss this with her daughter and will call me back.

## 2023-06-27 NOTE — Telephone Encounter (Signed)
The pt called back and informed Patty she would like to keep her surgery on schedule for 07/01/23

## 2023-06-30 ENCOUNTER — Encounter: Payer: Self-pay | Admitting: Neurosurgery

## 2023-06-30 MED ORDER — CEFAZOLIN IN SODIUM CHLORIDE 2-0.9 GM/100ML-% IV SOLN
2.0000 g | Freq: Once | INTRAVENOUS | Status: AC
Start: 2023-06-30 — End: 2023-07-01
  Administered 2023-07-01: 2 g via INTRAVENOUS
  Filled 2023-06-30: qty 100

## 2023-06-30 MED ORDER — LACTATED RINGERS IV SOLN
INTRAVENOUS | Status: DC
Start: 2023-06-30 — End: 2023-07-01

## 2023-06-30 MED ORDER — ORAL CARE MOUTH RINSE: 15.0000 mL | Freq: Once | OROMUCOSAL | Status: AC

## 2023-06-30 MED ORDER — CHLORHEXIDINE GLUCONATE 0.12 % MT SOLN
15.0000 mL | Freq: Once | OROMUCOSAL | Status: AC
  Administered 2023-07-01: 15 mL via OROMUCOSAL

## 2023-06-30 NOTE — Progress Notes (Signed)
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 Clinical nursing staff has updated patient's PMH/PSHx, current medication list, and drug allergies/intolerances to ensure comprehensive history available to assist in medical decision making as it pertains to the aforementioned surgical procedure and anticipated anesthetic course. Extensive review of available clinical information personally performed. [place] [facility] and PSHx updated with any diagnoses/procedures that  may have been inadvertently omitted during her intake with the pre-admission testing department's nursing staff.  Clinical Discussion:  Nicole Horton is a 52 y.o. female who is submitted for pre-surgical anesthesia review and clearance prior to her undergoing the above procedure. Patient is a Current Smoker (15 pack years). 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   Patient is followed by cardiology Nicole Pares, MD). She was last seen in the cardiology clinic on 06/22/2023; notes reviewed. At the time of her clinic  visit, patient doing well overall from a cardiovascular perspective. Patient denied any chest pain, shortness of breath, PND, orthopnea, palpitations, significant peripheral edema, weakness, fatigue, vertiginous symptoms, or presyncope/syncope. Patient with a past medical history significant for cardiovascular diagnoses. Documented physical exam was grossly benign, providing no evidence of acute exacerbation and/or decompensation of the patient's known cardiovascular conditions.  Myocardial perfusion imaging study was performed on 01/18/2014 revealing normal left ventricular systolic function with an EF of 61%.  There was 1.5 mm downsloping ST depression noted inferolaterally.  This finding was concerning for ischemia.  Cardiac catheterization was recommended.  Patient underwent diagnostic LEFT heart catheterization on 01/18/2014 here at [facility] with Dr. Adrian Blackwater, MD. Study results revealed normal coronary anatomy with no evidence of obstructive coronary artery disease.  Patient did have an interesting finding noted on catheterization. 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  Given that patient was symptomatic, cardiologist felt that this was due to partial vasospasm.  Patient was treated with ASA + nitrates.  Long-term cardiac event monitor study performed on 10/13/2016 revealing a predominant underlying sinus rhythm with episodes of both bradycardia and tachycardia.  There was rare ventricular and atrial ectopy accounting for less than 1% of the total study burden.  There were no extended pauses or significant arrhythmias noted.  Patient triggered events reported as "palpitations" correlated with sinus rhythm and sinus tachycardia.  Most recent TTE was performed on 09/04/2021 revealing a normal left ventricular systolic function with an EF of 64%.  There were no regional wall motion abnormalities.  Diastolic  Doppler parameters were normal.  Right ventricular size and function normal.  There was mild mitral valve regurgitation.  All transvalvular gradients were noted to be normal providing no evidence suggestive of valvular stenosis. Aorta normal in size with no evidence of aneurysmal dilatation.  Patient with a history of SVT ultimately requiring ablation, which she had done at [facility] on 04/21/2022.  Since her procedure, patient with intermittent palpitations, however denies any recurrent SVT episodes.  Patient with a paroxysmal atrial fibrillation diagnosis; CHA2DS2-VASc Score = 2 (sex, HTN).cardiac rate and rhythm currently being maintained intrinsically without pharmacological intervention.  She is not on chronic oral anticoagulation therapy. Blood pressure well controlled at 122/84 mmHg without the use of pharmacological intervention.  Patient was not taking any type of lipid-lowering therapies for her HLD diagnosis and ASCVD prevention.  She was not diabetic.  Patient did not have an OSAH diagnosis.  Functional capacity somewhat limited by patient's cervical stenosis with resulting myelopathy and radiculopathy.  With that said, patient is able to complete all of her ADLs/IADLs independently without cardiovascular limitation.  Per the [facility], patient able to exceed 4 METS of physical activity without experiencing any significant degrees of angina/anginal equivalent symptoms.  No changes were made to her medication regimen.  Patient to follow-up with outpatient cardiology in 6 months or sooner if needed.  Nicole Horton is scheduled for a C4-7 ANTERIOR CERVICAL DISCECTOMY AND FUSION on 07/01/2023 with Dr. Venetia Night, MD.  Given patient's past medical history significant for cardiovascular diagnoses, presurgical cardiac clearance was sought by the PAT team.  Additionally, clearance from patient's PCP was requested by surgeon's office.  Clearances were obtained as follows.  Per  internal medicine Nicole Flor, PA-C), "patient is optimized from a medical standpoint and may proceed with planned surgical intervention at an overall LOW risk of significant perioperative complications".  Per cardiology Nicole Pares, MD), "this patient is optimized for surgery and may proceed with the planned procedural course with a LOW risk of significant perioperative cardiovascular complications".  In review of her medication reconciliation, the patient is not noted to be taking any type of anticoagulation or antiplatelet therapies that would need to be held during her perioperative course.  Patient reports previous perioperative complications with anesthesia in the past.  Patient reporting (+) early emergence from anesthesia/sedation that she received for cardiac catheterization.  In review of the available records, it is noted that patient underwent a general anesthetic course at [facility] ([facility]) in 04/2022 without documented complications.      06/27/2023   11:12 AM 06/21/2023    3:25 PM 06/14/2023    9:58 AM  Vitals with BMI  Height 5\' 1"  5\' 1"  5\' 1"   Weight 158 lbs 161 lbs 161 lbs  BMI 29.87 30.44 30.44  Systolic 113 130 295  Diastolic 65 84 84  Pulse 74      Providers/Specialists:   NOTE: Primary physician provider listed below. Patient may have been seen by APP or partner within same practice.   PROVIDER ROLE / SPECIALTY LAST Donalynn Furlong, MD Neurosurgery (Surgeon) 06/21/2023  Wilford Corner, PA-C Primary Care Provider 05/16/2023  Rudean Hitt, MD Cardiology 06/22/2023  Cristopher Peru, MD Neurology 06/08/2023   Allergies:  Other, Codeine, and Robaxin [methocarbamol]  Current Home Medications:   No current facility-administered medications for this encounter.    ALPRAZolam (XANAX) 0.25 MG tablet   cyclobenzaprine (FLEXERIL) 10 MG tablet   traMADol (ULTRAM) 50 MG tablet   albuterol (PROVENTIL HFA;VENTOLIN HFA) 108 (90 Base)  MCG/ACT inhaler   ipratropium-albuterol (DUONEB) 0.5-2.5 (3) MG/3ML SOLN  ondansetron (ZOFRAN-ODT) 4 MG disintegrating tablet   History:   Past Medical History:  Diagnosis Date   Abnormal uterine bleeding 05/15/2014   Adnexal pain 08/13/2015   Anxiety    a.) on BZO PRN (alprazolam)   Asthma    Cervical myelopathy with cervical radiculopathy (HCC)    Cervical spinal stenosis    Complication of anesthesia    a.) early emergence from anesthesia/sedation used for cardiac catheterization   COVID-19    Essential (primary) hypertension 07/31/2015   GERD (gastroesophageal reflux disease)    Granulomatous lung disease (HCC)    History of cardiac catheterization 01/18/2014   a.) LHC 01/18/2014: normal coronaries; myocardial bridge dLAD casuing compression/spasm   History of kidney stones    Hyperlipidemia    Multiple thyroid nodules    a.) cervical MRI 06/08/2023: multiple thyroid nodules with largest mesauring 1.7 cm   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   a.) LHC 01/18/2014: 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   PAF (paroxysmal atrial fibrillation) (HCC)    a.) CHA2DS2-VASc = 2 (sex, HTN) as of 06/30/2023; b.) cardiac rate/rhythm maintained intrinsically without pharmacological intervention; no chronic OAC   Palpitations (PAC/PVCs)    a.) holter 10/13/2016: rare atrial and ventricular ectopy (< 1.0% study burden)   Pelvic phlebolithiasis    Pneumonia    RLS (restless legs syndrome)    SVT (supraventricular tachycardia) (HCC)    a.) s/p SVT ablation at [facility] on 04/21/2022   Past Surgical History:  Procedure Laterality Date   ABDOMINAL HYSTERECTOMY     BREAST BIOPSY Right 01/23/2021   Bx showed fibroepithelial (fibroadenoma) lesion   COLONOSCOPY     CYSTOSCOPY  08/11/2017   Procedure: CYSTOSCOPY;  Surgeon: Nadara Mustard, MD;  Location: [facility] ORS;  Service: Gynecology;;   ESOPHAGOGASTRODUODENOSCOPY (EGD) WITH ESOPHAGEAL DILATION     EXCISION OF BREAST LESION  Right 02/13/2021   Procedure: EXCISION OF BREAST LESION;  Surgeon: Earline Mayotte, MD;  Location: [facility] ORS;  Service: General;  Laterality: Right;   LAPAROSCOPIC HYSTERECTOMY Bilateral 08/11/2017   Procedure: HYSTERECTOMY TOTAL LAPAROSCOPIC BILATERAL SALPINGECTOMY;  Surgeon: Nadara Mustard, MD;  Location: [facility] ORS;  Service: Gynecology;  Laterality: Bilateral;   LEFT HEART CATH AND CORONARY ANGIOGRAPHY N/A 01/18/2014   Procedure: LEFT HEART CATH AND CORONARY ANGIOGRAPHY; Location: [facility]   SVT ABLATION N/A 04/21/2022   Procedure: SVT ABLATION; Location: [facility]; Surgeon: Gerre Pebbles, MD   TUBAL LIGATION     Family History  Problem Relation Age of Onset   Diabetes Mother    Heart failure Mother    Breast cancer Mother 43   Brain cancer Maternal Aunt    Ovarian cancer Maternal Grandmother 69   Rectal cancer Maternal Grandfather    Hematuria Neg Hx    Social History   Tobacco Use   Smoking status: Every Day    Current packs/day: 0.50    Average packs/day: 0.5 packs/day for 30.0 years (15.0 ttl pk-yrs)    Types: Cigarettes   Smokeless tobacco: Never  Vaping Use   Vaping status: Former   Devices: tried short time  Substance Use Topics   Alcohol use: No   Drug use: No    Pertinent Clinical Results:  LABS:    Component Ref Range & Units 05/16/2023  WBC (White Blood Cell Count) 4.1 - 10.2 10^3/uL 10.6 High   RBC (Red Blood Cell Count) 4.04 - 5.48 10^6/uL 4.77  Hemoglobin 12.0 - 15.0 gm/dL 16.1  Hematocrit 09.6 - 47.0 % 43.7  MCV (Mean Corpuscular Volume) 80.0 - 100.0 fl 91.6  MCH (Mean Corpuscular Hemoglobin) 27.0 - 31.2 pg 31  MCHC (Mean Corpuscular Hemoglobin Concentration) 32.0 - 36.0 gm/dL 16.1  Platelet Count 096 - 450 10^3/uL 289  RDW-CV (Red Cell Distribution Width) 11.6 - 14.8 % 12.6  MPV (Mean Platelet Volume) 9.4 - 12.4 fl 9.4  Neutrophils 1.50 - 7.80 10^3/uL 6.02  Lymphocytes 1.00 - 3.60 10^3/uL 3.79 High   Monocytes 0.00 - 1.50 10^3/uL 0.56   Eosinophils 0.00 - 0.55 10^3/uL 0.11  Basophils 0.00 - 0.09 10^3/uL 0.08  Neutrophil % 32.0 - 70.0 % 56.9  Lymphocyte % 10.0 - 50.0 % 35.9  Monocyte % 4.0 - 13.0 % 5.3  Eosinophil % 1.0 - 5.0 % 1  Basophil% 0.0 - 2.0 % 0.8  Immature Granulocyte % <=0.7 % 0.1  Immature Granulocyte Count <=0.06 10^3/L 0.01  Resulting Agency [facility]  Specimen Collected: 05/16/23 15:01   Performed by: [facility] Last Resulted: 05/16/23 15:54  Received From: [facility]  Result Received: 06/07/23 14:00   Component Ref Range & Units 05/16/2023  Glucose 70 - 110 mg/dL 79  Sodium 045 - 409 mmol/L 140  Potassium 3.6 - 5.1 mmol/L 4.2  Chloride 97 - 109 mmol/L 106  Carbon Dioxide (CO2) 22.0 - 32.0 mmol/L 26.6  Calcium 8.7 - 10.3 mg/dL 9.6  Urea Nitrogen (BUN) 7 - 25 mg/dL 13  Creatinine 0.6 - 1.1 mg/dL 0.9  Glomerular Filtration Rate (eGFR) >60 mL/min/1.73sq m 77  BUN/Crea Ratio 6.0 - 20.0 14.4  Anion Gap w/K 6.0 - 16.0 11.6  Resulting Agency [facility]  Specimen Collected: 05/16/23 14:40   Performed by: [facility] Last Resulted: 05/16/23 16:41  Received From: [facility]  Result Received: 06/07/23 14:00    Component Date Value Ref Range Status   MRSA, PCR 06/27/2023 NEGATIVE  NEGATIVE Final   Staphylococcus aureus 06/27/2023 NEGATIVE  NEGATIVE Final   Comment: (NOTE) The Xpert SA Assay (FDA approved for NASAL specimens in patients 66 years of age and older), is one component of a comprehensive surveillance program. It is not intended to diagnose infection nor to guide or monitor treatment. Performed at [facility], [address] [place], [place] [address]     ECG: Date: 06/22/2023 Time ECG obtained: 0927 AM Rate: 76 bpm Rhythm: normal sinus Axis (leads I and aVF): Normal Intervals: PR 146 ms. QRS 82 ms. QTc 432 ms. ST segment and T wave changes:  Nonspecific ST and T wave abnormalities in the inferior leads Comparison: Similar to previous tracing obtained on 04/21/2022 NOTE: Tracing obtained at [facility]; unable for review. Above based on cardiologist's interpretation.    IMAGING / PROCEDURES: MR CERVICAL SPINE W WO CONTRAST performed on 06/08/2023 C5-C6 severe spinal canal stenosis with mild-to-moderate bilateral neural foraminal narrowing.  A right subarticular disc osteophyte protrusion indents and deforms the right aspect of the spinal cord, with resulting compression.  Possible mildly increased T2 hyperintense signal in the right aspect of the spinal cord, which may represent edema or myelomalacia. C6-C7 moderate spinal canal stenosis and mild-to-moderate left neural foraminal narrowing. C4-C5 moderate spinal canal stenosis. C3-C4 mild spinal canal stenosis. C7-T1 mild left neural foraminal narrowing. Multiple nodules in the thyroid, the largest of which measures up to 1.7 cm. If this has not previously been evaluated, a non-emergent ultrasound of the thyroid is recommended. (Reference: J Am Coll Radiol.  2015 Feb;12(2): 143-50)  DIAGNOSTIC RADIOGRAPHS OF CHEST 2 VIEWS Performed on 08/13/2022 Unchanged cardiomediastinal silhouette.  There is no focal airspace consolidation.  There is no pleural effusion or evidence of pneumothorax.  Unchanged calcified granuloma in the right lung base and probable calcified granuloma in the peripheral left apex, unchanged on multiple prior exams dating back to June 2015.  No acute osseous abnormality.  TRANSTHORACIC ECHOCARDIOGRAM performed on 09/04/2021 Normal left ventricular systolic function with an EF of 64.3% No regional wall motion abnormalities Right ventricular size and function normal Mild mitral valve regurgitation Normal gradients; no valvular stenosis  LONG TERM CARDIAC EVENT MONITOR STUDY performed on 10/13/2016 Predominant underlying sinus rhythm with episodes of  bradycardia and tachycardia Rare unifocal PVCs accounting for < 1% study burden Rare supraventricular ectopy accounting for <1% study burden There were no sustained pauses or arrhythmias Triggered events described as "palpitations" correlated with sinus rhythm and sinus tachycardia  LEFT HEART CATHETERIZATION AND CORONARY ANGIOGRAPHY performed on 01/18/2014 Normal coronary anatomy with no evidence of obstructive coronary artery disease Normal LVEDP and LVEF Myocardial bridge to the distal LAD causing mechanical compression of the LAD Suspected vasospasm of the distal LAD.  Treated with ASA + nitrates. Plan: patient to follow-up with outpatient cardiology for ongoing care management.  MYOCARDIAL PERFUSION IMAGING STUDY (LEXISCAN) performed on 01/18/2014 Patient had 1.5 cm ischemic downsloping ST depression inferolaterally and moderate ischemia anterior wall suggestive of LAD ischemia The estimated ejection fraction is 61%. The left ventricular global function was normal. There is no artifact noted on this study.  No significant wall motion abnormality noted.  NSR 60/min with no significant st depressions.  Overall, this is a Moderate risk scan.  This patient should be considered for an alternate imaging study such  as  heart catheterization.   Impression and Plan:  Nicole Horton has been referred for pre-anesthesia review and clearance prior to her undergoing the planned anesthetic and procedural courses. Available labs, pertinent testing, and imaging results were personally reviewed by me in preparation for upcoming operative/procedural course. [facility] medical record has been updated following extensive record review and patient interview with PAT staff.   This patient has been appropriately cleared by cardiology (LOW) and by internal/family medicine (LOW) with the individually indicated risk of patient experiencing significant perioperative complications. Based on clinical review  performed today (06/30/23), barring any significant acute changes in the patient's overall condition, it is anticipated that she will be able to proceed with the planned surgical intervention. Any acute changes in clinical condition may necessitate her procedure being postponed and/or cancelled. Patient will meet with anesthesia team (MD and/or CRNA) on the day of her procedure for preoperative evaluation/assessment. Questions regarding anesthetic course will be fielded at that time.   Pre-surgical instructions were reviewed with the patient during her PAT appointment, and questions were fielded to satisfaction by PAT clinical staff. She has been instructed on which medications that she will need to hold prior to surgery, as well as the ones that have been deemed safe/appropriate to take on the day of her procedure. As part of the general education provided by PAT, patient made aware both verbally and in writing, that she would need to abstain from the use of any illegal substances during her perioperative course.  She was advised that failure to follow the provided instructions could necessitate case cancellation or result in serious perioperative complications up to and including death. Patient encouraged to contact PAT and/or her surgeon's office to discuss any  questions or concerns that may arise prior to surgery; verbalized understanding.   Quentin Mulling, MSN, APRN, FNP-C, CEN [facility]  Perioperative Services Nurse Practitioner Phone: [phone] Fax: [phone] 06/30/23 3:28 PM  NOTE: This note has been prepared using Dragon dictation software. Despite my best ability to proofread, there is always the potential that unintentional transcriptional errors may still occur from this process.

## 2023-07-01 ENCOUNTER — Other Ambulatory Visit: Payer: Self-pay

## 2023-07-01 ENCOUNTER — Encounter: Payer: Self-pay | Admitting: Neurosurgery

## 2023-07-01 ENCOUNTER — Ambulatory Visit: Payer: Managed Care, Other (non HMO)

## 2023-07-01 ENCOUNTER — Encounter
Admission: RE | Disposition: A | Discharge status: Home / Self Care | Payer: Self-pay | Source: Ambulatory Visit | Attending: Neurosurgery

## 2023-07-01 ENCOUNTER — Ambulatory Visit: Payer: Managed Care, Other (non HMO) | Admitting: Urgent Care

## 2023-07-01 ENCOUNTER — Observation Stay
Admission: RE | Admit: 2023-07-01 | Discharge: 2023-07-02 | Disposition: A | Discharge status: Home / Self Care | Payer: Managed Care, Other (non HMO) | Source: Ambulatory Visit | Attending: Neurosurgery | Admitting: Neurosurgery

## 2023-07-01 DIAGNOSIS — M50123 Cervical disc disorder at C6-C7 level with radiculopathy: Secondary | ICD-10-CM | POA: Diagnosis not present

## 2023-07-01 DIAGNOSIS — M4802 Spinal stenosis, cervical region: Secondary | ICD-10-CM | POA: Insufficient documentation

## 2023-07-01 DIAGNOSIS — Z79899 Other long term (current) drug therapy: Secondary | ICD-10-CM | POA: Insufficient documentation

## 2023-07-01 DIAGNOSIS — M5412 Radiculopathy, cervical region: Secondary | ICD-10-CM

## 2023-07-01 DIAGNOSIS — I4891 Unspecified atrial fibrillation: Secondary | ICD-10-CM | POA: Diagnosis not present

## 2023-07-01 DIAGNOSIS — G959 Disease of spinal cord, unspecified: Secondary | ICD-10-CM | POA: Diagnosis not present

## 2023-07-01 DIAGNOSIS — M50121 Cervical disc disorder at C4-C5 level with radiculopathy: Secondary | ICD-10-CM | POA: Insufficient documentation

## 2023-07-01 DIAGNOSIS — G9589 Other specified diseases of spinal cord: Secondary | ICD-10-CM | POA: Diagnosis not present

## 2023-07-01 DIAGNOSIS — M50021 Cervical disc disorder at C4-C5 level with myelopathy: Principal | ICD-10-CM | POA: Insufficient documentation

## 2023-07-01 DIAGNOSIS — I10 Essential (primary) hypertension: Secondary | ICD-10-CM | POA: Diagnosis not present

## 2023-07-01 DIAGNOSIS — F17210 Nicotine dependence, cigarettes, uncomplicated: Secondary | ICD-10-CM | POA: Diagnosis not present

## 2023-07-01 DIAGNOSIS — M50022 Cervical disc disorder at C5-C6 level with myelopathy: Secondary | ICD-10-CM | POA: Insufficient documentation

## 2023-07-01 DIAGNOSIS — M50122 Cervical disc disorder at C5-C6 level with radiculopathy: Secondary | ICD-10-CM | POA: Insufficient documentation

## 2023-07-01 DIAGNOSIS — M50023 Cervical disc disorder at C6-C7 level with myelopathy: Secondary | ICD-10-CM | POA: Diagnosis not present

## 2023-07-01 DIAGNOSIS — Z01818 Encounter for other preprocedural examination: Secondary | ICD-10-CM

## 2023-07-01 HISTORY — DX: Other complications of anesthesia, initial encounter: T88.59XA

## 2023-07-01 HISTORY — DX: Spinal stenosis, cervical region: M48.02

## 2023-07-01 HISTORY — DX: Restless legs syndrome: G25.81

## 2023-07-01 HISTORY — DX: Unspecified asthma, uncomplicated: J45.909

## 2023-07-01 HISTORY — DX: Radiculopathy, cervical region: M54.12

## 2023-07-01 HISTORY — DX: Pulmonary fibrosis, unspecified: J84.10

## 2023-07-01 HISTORY — DX: Paroxysmal atrial fibrillation: I48.0

## 2023-07-01 HISTORY — PX: ANTERIOR CERVICAL DECOMP/DISCECTOMY FUSION: SHX1161

## 2023-07-01 HISTORY — DX: Nontoxic multinodular goiter: E04.2

## 2023-07-01 HISTORY — DX: Other specified disorders of veins: I87.8

## 2023-07-01 HISTORY — DX: Supraventricular tachycardia, unspecified: I47.10

## 2023-07-01 LAB — TYPE AND SCREEN
ABO/RH(D): A POS
Antibody Screen: NEGATIVE

## 2023-07-01 SURGERY — ANTERIOR CERVICAL DECOMPRESSION/DISCECTOMY FUSION 3 LEVELS
Anesthesia: General

## 2023-07-01 MED ORDER — LIDOCAINE HCL (CARDIAC) PF 100 MG/5ML IV SOSY
PREFILLED_SYRINGE | INTRAVENOUS | Status: DC | PRN
  Administered 2023-07-01: 100 mg via INTRAVENOUS

## 2023-07-01 MED ORDER — FENTANYL CITRATE (PF) 100 MCG/2ML IJ SOLN
INTRAMUSCULAR | Status: DC | PRN
  Administered 2023-07-01 (×2): 50 ug via INTRAVENOUS

## 2023-07-01 MED ORDER — ACETAMINOPHEN 650 MG RE SUPP: 650.0000 mg | RECTAL | Status: DC | PRN

## 2023-07-01 MED ORDER — EPHEDRINE 5 MG/ML INJ
INTRAVENOUS | Status: AC
  Filled 2023-07-01: qty 5

## 2023-07-01 MED ORDER — FENTANYL CITRATE (PF) 100 MCG/2ML IJ SOLN
25.0000 ug | INTRAMUSCULAR | Status: DC | PRN
  Administered 2023-07-01 (×5): 25 ug via INTRAVENOUS

## 2023-07-01 MED ORDER — PROPOFOL 10 MG/ML IV BOLUS
INTRAVENOUS | Status: DC | PRN
  Administered 2023-07-01: 50 mg via INTRAVENOUS
  Administered 2023-07-01: 160 ug/kg/min via INTRAVENOUS
  Administered 2023-07-01: 150 mg via INTRAVENOUS
  Administered 2023-07-01: 50 mg via INTRAVENOUS

## 2023-07-01 MED ORDER — LIDOCAINE HCL (PF) 2 % IJ SOLN
INTRAMUSCULAR | Status: AC
  Filled 2023-07-01: qty 5

## 2023-07-01 MED ORDER — IPRATROPIUM-ALBUTEROL 0.5-2.5 (3) MG/3ML IN SOLN: 3.0000 mL | RESPIRATORY_TRACT | Status: DC | PRN

## 2023-07-01 MED ORDER — PROPOFOL 10 MG/ML IV BOLUS
INTRAVENOUS | Status: AC
Start: 2023-07-01 — End: ?
  Filled 2023-07-01: qty 20

## 2023-07-01 MED ORDER — REMIFENTANIL HCL 1 MG IV SOLR
INTRAVENOUS | Status: DC | PRN
Start: 2023-07-01 — End: 2023-07-01
  Administered 2023-07-01: .1 ug/kg/min via INTRAVENOUS

## 2023-07-01 MED ORDER — CYCLOBENZAPRINE HCL 10 MG PO TABS
10.0000 mg | ORAL_TABLET | Freq: Every evening | ORAL | Status: DC | PRN
  Administered 2023-07-01: 10 mg via ORAL
  Filled 2023-07-01: qty 1

## 2023-07-01 MED ORDER — SUCCINYLCHOLINE CHLORIDE 200 MG/10ML IV SOSY
PREFILLED_SYRINGE | INTRAVENOUS | Status: AC
  Filled 2023-07-01: qty 10

## 2023-07-01 MED ORDER — DOCUSATE SODIUM 100 MG PO CAPS
100.0000 mg | ORAL_CAPSULE | Freq: Two times a day (BID) | ORAL | Status: DC
  Administered 2023-07-01 – 2023-07-02 (×3): 100 mg via ORAL
  Filled 2023-07-01 (×3): qty 1

## 2023-07-01 MED ORDER — ONDANSETRON HCL 4 MG PO TABS: 4.0000 mg | ORAL_TABLET | Freq: Four times a day (QID) | ORAL | Status: DC | PRN

## 2023-07-01 MED ORDER — ONDANSETRON HCL 4 MG/2ML IJ SOLN: 4.0000 mg | Freq: Four times a day (QID) | INTRAMUSCULAR | Status: DC | PRN

## 2023-07-01 MED ORDER — ONDANSETRON HCL 4 MG/2ML IJ SOLN
INTRAMUSCULAR | Status: DC | PRN
  Administered 2023-07-01: 4 mg via INTRAVENOUS

## 2023-07-01 MED ORDER — ACETAMINOPHEN 10 MG/ML IV SOLN
INTRAVENOUS | Status: AC
  Filled 2023-07-01: qty 100

## 2023-07-01 MED ORDER — MENTHOL 3 MG MT LOZG: 1.0000 | LOZENGE | OROMUCOSAL | Status: DC | PRN

## 2023-07-01 MED ORDER — BUPIVACAINE-EPINEPHRINE (PF) 0.5% -1:200000 IJ SOLN
INTRAMUSCULAR | Status: DC | PRN
  Administered 2023-07-01: 10 mL via PERINEURAL

## 2023-07-01 MED ORDER — SODIUM CHLORIDE (PF) 0.9 % IJ SOLN
INTRAMUSCULAR | Status: AC
  Filled 2023-07-01: qty 20

## 2023-07-01 MED ORDER — SUCCINYLCHOLINE CHLORIDE 200 MG/10ML IV SOSY
PREFILLED_SYRINGE | INTRAVENOUS | Status: DC | PRN
  Administered 2023-07-01: 140 mg via INTRAVENOUS

## 2023-07-01 MED ORDER — PROPOFOL 1000 MG/100ML IV EMUL
INTRAVENOUS | Status: AC
  Filled 2023-07-01: qty 100

## 2023-07-01 MED ORDER — ENOXAPARIN SODIUM 40 MG/0.4ML IJ SOSY
40.0000 mg | PREFILLED_SYRINGE | INTRAMUSCULAR | Status: DC
  Administered 2023-07-02: 40 mg via SUBCUTANEOUS
  Filled 2023-07-01: qty 0.4

## 2023-07-01 MED ORDER — 0.9 % SODIUM CHLORIDE (POUR BTL) OPTIME
TOPICAL | Status: DC | PRN
  Administered 2023-07-01: 1000 mL

## 2023-07-01 MED ORDER — OXYCODONE HCL 5 MG/5ML PO SOLN: 5.0000 mg | Freq: Once | ORAL | Status: AC | PRN

## 2023-07-01 MED ORDER — ACETAMINOPHEN 10 MG/ML IV SOLN: 1000.0000 mg | Freq: Once | INTRAVENOUS | Status: DC | PRN

## 2023-07-01 MED ORDER — SENNA 8.6 MG PO TABS
1.0000 | ORAL_TABLET | Freq: Two times a day (BID) | ORAL | Status: DC
Start: 2023-07-01 — End: 2023-07-02
  Administered 2023-07-01 – 2023-07-02 (×3): 8.6 mg via ORAL
  Filled 2023-07-01 (×3): qty 1

## 2023-07-01 MED ORDER — DEXAMETHASONE SODIUM PHOSPHATE 10 MG/ML IJ SOLN
INTRAMUSCULAR | Status: AC
  Filled 2023-07-01: qty 1

## 2023-07-01 MED ORDER — OXYCODONE HCL 5 MG PO TABS: 5.0000 mg | ORAL_TABLET | ORAL | Status: DC | PRN

## 2023-07-01 MED ORDER — FENTANYL CITRATE (PF) 100 MCG/2ML IJ SOLN
INTRAMUSCULAR | Status: AC
  Filled 2023-07-01: qty 2

## 2023-07-01 MED ORDER — EPHEDRINE SULFATE-NACL 50-0.9 MG/10ML-% IV SOSY
PREFILLED_SYRINGE | INTRAVENOUS | Status: DC | PRN
  Administered 2023-07-01: 10 mg via INTRAVENOUS

## 2023-07-01 MED ORDER — PHENOL 1.4 % MT LIQD: 1.0000 | OROMUCOSAL | Status: DC | PRN

## 2023-07-01 MED ORDER — ONDANSETRON HCL 4 MG/2ML IJ SOLN
4.0000 mg | Freq: Once | INTRAMUSCULAR | Status: DC | PRN
Start: 2023-07-01 — End: 2023-07-01

## 2023-07-01 MED ORDER — ACETAMINOPHEN 325 MG PO TABS: 650.0000 mg | ORAL_TABLET | ORAL | Status: DC | PRN

## 2023-07-01 MED ORDER — PHENYLEPHRINE HCL-NACL 20-0.9 MG/250ML-% IV SOLN
INTRAVENOUS | Status: AC
Start: 2023-07-01 — End: ?
  Filled 2023-07-01: qty 250

## 2023-07-01 MED ORDER — CHLORHEXIDINE GLUCONATE 0.12 % MT SOLN
OROMUCOSAL | Status: AC
Start: 2023-07-01 — End: ?
  Filled 2023-07-01: qty 15

## 2023-07-01 MED ORDER — ACETAMINOPHEN 10 MG/ML IV SOLN
INTRAVENOUS | Status: DC | PRN
  Administered 2023-07-01: 1000 mg via INTRAVENOUS

## 2023-07-01 MED ORDER — OXYCODONE HCL 5 MG PO TABS
ORAL_TABLET | ORAL | Status: AC
  Filled 2023-07-01: qty 1

## 2023-07-01 MED ORDER — SODIUM CHLORIDE 0.9 % IV SOLN
250.0000 mL | INTRAVENOUS | Status: DC
  Administered 2023-07-01: 250 mL via INTRAVENOUS

## 2023-07-01 MED ORDER — BUPIVACAINE-EPINEPHRINE (PF) 0.5% -1:200000 IJ SOLN
INTRAMUSCULAR | Status: AC
Start: 2023-07-01 — End: ?
  Filled 2023-07-01: qty 30

## 2023-07-01 MED ORDER — SODIUM CHLORIDE 0.9% FLUSH: 3.0000 mL | INTRAVENOUS | Status: DC | PRN

## 2023-07-01 MED ORDER — CEFAZOLIN SODIUM-DEXTROSE 2-4 GM/100ML-% IV SOLN
2.0000 g | Freq: Four times a day (QID) | INTRAVENOUS | Status: AC
  Administered 2023-07-01 (×2): 2 g via INTRAVENOUS
  Filled 2023-07-01 (×2): qty 100

## 2023-07-01 MED ORDER — PROPOFOL 10 MG/ML IV BOLUS
INTRAVENOUS | Status: AC
  Filled 2023-07-01: qty 20

## 2023-07-01 MED ORDER — GLYCOPYRROLATE 0.2 MG/ML IJ SOLN
INTRAMUSCULAR | Status: DC | PRN
  Administered 2023-07-01: .2 mg via INTRAVENOUS

## 2023-07-01 MED ORDER — OXYCODONE HCL 5 MG PO TABS
10.0000 mg | ORAL_TABLET | ORAL | Status: DC | PRN
  Administered 2023-07-01: 10 mg via ORAL
  Filled 2023-07-01 (×2): qty 2

## 2023-07-01 MED ORDER — KETOROLAC TROMETHAMINE 15 MG/ML IJ SOLN
INTRAMUSCULAR | Status: AC
  Filled 2023-07-01: qty 1

## 2023-07-01 MED ORDER — KETOROLAC TROMETHAMINE 15 MG/ML IJ SOLN
15.0000 mg | Freq: Four times a day (QID) | INTRAMUSCULAR | Status: AC
  Administered 2023-07-01 – 2023-07-02 (×4): 15 mg via INTRAVENOUS
  Filled 2023-07-01 (×3): qty 1

## 2023-07-01 MED ORDER — SODIUM CHLORIDE 0.9 % IV SOLN: INTRAVENOUS | Status: DC

## 2023-07-01 MED ORDER — PHENYLEPHRINE 80 MCG/ML (10ML) SYRINGE FOR IV PUSH (FOR BLOOD PRESSURE SUPPORT)
PREFILLED_SYRINGE | INTRAVENOUS | Status: DC | PRN
  Administered 2023-07-01: 80 ug via INTRAVENOUS

## 2023-07-01 MED ORDER — ONDANSETRON HCL 4 MG/2ML IJ SOLN
INTRAMUSCULAR | Status: AC
  Filled 2023-07-01: qty 2

## 2023-07-01 MED ORDER — SODIUM CHLORIDE 0.9% FLUSH
3.0000 mL | Freq: Two times a day (BID) | INTRAVENOUS | Status: DC
  Administered 2023-07-01 – 2023-07-02 (×3): 3 mL via INTRAVENOUS

## 2023-07-01 MED ORDER — DEXAMETHASONE SODIUM PHOSPHATE 10 MG/ML IJ SOLN
INTRAMUSCULAR | Status: DC | PRN
  Administered 2023-07-01: 10 mg via INTRAVENOUS

## 2023-07-01 MED ORDER — KETAMINE HCL 50 MG/5ML IJ SOSY
PREFILLED_SYRINGE | INTRAMUSCULAR | Status: AC
  Filled 2023-07-01: qty 5

## 2023-07-01 MED ORDER — PHENYLEPHRINE 80 MCG/ML (10ML) SYRINGE FOR IV PUSH (FOR BLOOD PRESSURE SUPPORT)
PREFILLED_SYRINGE | INTRAVENOUS | Status: AC
  Filled 2023-07-01: qty 10

## 2023-07-01 MED ORDER — OXYCODONE HCL 5 MG PO TABS
5.0000 mg | ORAL_TABLET | Freq: Once | ORAL | Status: AC | PRN
  Administered 2023-07-01: 5 mg via ORAL

## 2023-07-01 MED ORDER — KETAMINE HCL 50 MG/5ML IJ SOSY
PREFILLED_SYRINGE | INTRAMUSCULAR | Status: DC | PRN
  Administered 2023-07-01 (×2): 10 mg via INTRAVENOUS
  Administered 2023-07-01: 30 mg via INTRAVENOUS

## 2023-07-01 MED ORDER — MIDAZOLAM HCL 2 MG/2ML IJ SOLN
INTRAMUSCULAR | Status: AC
  Filled 2023-07-01: qty 2

## 2023-07-01 MED ORDER — MIDAZOLAM HCL 2 MG/2ML IJ SOLN
INTRAMUSCULAR | Status: DC | PRN
  Administered 2023-07-01: 2 mg via INTRAVENOUS

## 2023-07-01 MED ORDER — REMIFENTANIL HCL 1 MG IV SOLR
INTRAVENOUS | Status: AC
  Filled 2023-07-01: qty 1000

## 2023-07-01 MED ORDER — PHENYLEPHRINE HCL-NACL 20-0.9 MG/250ML-% IV SOLN
INTRAVENOUS | Status: DC | PRN
  Administered 2023-07-01: 40 ug/min via INTRAVENOUS

## 2023-07-01 MED ORDER — GLYCOPYRROLATE 0.2 MG/ML IJ SOLN
INTRAMUSCULAR | Status: AC
Start: 2023-07-01 — End: ?
  Filled 2023-07-01: qty 1

## 2023-07-01 MED ORDER — LACTATED RINGERS IV SOLN: INTRAVENOUS | Status: AC

## 2023-07-01 SURGICAL SUPPLY — 53 items
ALLOGRAFT BONE FIBER KORE 1CC (Bone Implant) IMPLANT
BASIN KIT SINGLE STR (MISCELLANEOUS) ×1 IMPLANT
BUR NEURO DRILL SOFT 3.0X3.8M (BURR) ×1 IMPLANT
DERMABOND ADVANCED .7 DNX12 (GAUZE/BANDAGES/DRESSINGS) ×1 IMPLANT
DRAIN CHANNEL JP 10F RND 20C F (MISCELLANEOUS) IMPLANT
DRAPE C ARM PK CFD 31 SPINE (DRAPES) ×1 IMPLANT
DRAPE LAPAROTOMY 77X122 PED (DRAPES) ×1 IMPLANT
DRAPE MICROSCOPE SPINE 48X150 (DRAPES) ×1 IMPLANT
DRSG TEGADERM 4X4.75 (GAUZE/BANDAGES/DRESSINGS) IMPLANT
ELECT REM PT RETURN 9FT ADLT (ELECTROSURGICAL) ×1
ELECTRODE REM PT RTRN 9FT ADLT (ELECTROSURGICAL) ×1 IMPLANT
EVACUATOR 1/8 PVC DRAIN (DRAIN) IMPLANT
EVACUATOR SILICONE 100CC (DRAIN) IMPLANT
FEE INTRAOP CADWELL SUPPLY NCS (MISCELLANEOUS) IMPLANT
FEE INTRAOP MONITOR IMPULS NCS (MISCELLANEOUS) IMPLANT
GAUZE SPONGE 2X2 STRL 8-PLY (GAUZE/BANDAGES/DRESSINGS) IMPLANT
GLOVE BIOGEL PI IND STRL 6.5 (GLOVE) ×1 IMPLANT
GLOVE SURG SYN 6.5 ES PF (GLOVE) ×1 IMPLANT
GLOVE SURG SYN 6.5 PF PI (GLOVE) ×1 IMPLANT
GLOVE SURG SYN 8.5 E (GLOVE) ×3 IMPLANT
GLOVE SURG SYN 8.5 PF PI (GLOVE) ×3 IMPLANT
GOWN SRG LRG LVL 4 IMPRV REINF (GOWNS) ×1 IMPLANT
GOWN SRG XL LVL 3 NONREINFORCE (GOWNS) ×1 IMPLANT
HOLDER FOLEY CATH W/STRAP (MISCELLANEOUS) IMPLANT
INTRAOP CADWELL SUPPLY FEE NCS (MISCELLANEOUS)
INTRAOP MONITOR FEE IMPULS NCS (MISCELLANEOUS) ×1
KIT TURNOVER KIT A (KITS) ×1 IMPLANT
MANIFOLD NEPTUNE II (INSTRUMENTS) ×1 IMPLANT
NDL SAFETY ECLIPSE 18X1.5 (NEEDLE) ×1 IMPLANT
NS IRRIG 500ML POUR BTL (IV SOLUTION) ×1 IMPLANT
PACK LAMINECTOMY ARMC (PACKS) ×1 IMPLANT
PAD ARMBOARD 7.5X6 YLW CONV (MISCELLANEOUS) ×2 IMPLANT
PIN CASPAR 14 (PIN) ×1 IMPLANT
PIN CASPAR 14MM (PIN) ×1 IMPLANT
PLATE ACP 1.9X52 3LVL (Plate) IMPLANT
SCREW ACP VA SD 3.5X15 (Screw) IMPLANT
SCREW ACP VA ST 4.0 X 17 (Screw) IMPLANT
SPACER C HEDRON 12X14 6 7D (Spacer) IMPLANT
SPACER C HEDRON 12X14 7M 7D (Spacer) IMPLANT
SPONGE KITTNER 5P (MISCELLANEOUS) ×1 IMPLANT
STAPLER SKIN PROX 35W (STAPLE) IMPLANT
SURGIFLO W/THROMBIN 8M KIT (HEMOSTASIS) ×1 IMPLANT
SUT ETHILON 3 0 PS 1 (SUTURE) IMPLANT
SUT ETHILON 3-0 FS-10 30 BLK (SUTURE) ×1
SUT STRATAFIX SPIRAL PDS3-0 (SUTURE) ×1 IMPLANT
SUT VIC AB 0 CT1 27XCR 8 STRN (SUTURE) ×2 IMPLANT
SUT VICRYL 3-0 CR8 SH (SUTURE) ×1 IMPLANT
SUTURE EHLN 3-0 FS-10 30 BLK (SUTURE) IMPLANT
SYR 20ML LL LF (SYRINGE) ×1 IMPLANT
TAPE CLOTH 3X10 WHT NS LF (GAUZE/BANDAGES/DRESSINGS) ×2 IMPLANT
TRAP FLUID SMOKE EVACUATOR (MISCELLANEOUS) ×1 IMPLANT
TRAY FOLEY SLVR 16FR LF STAT (SET/KITS/TRAYS/PACK) IMPLANT
WATER STERILE IRR 500ML POUR (IV SOLUTION) ×1 IMPLANT

## 2023-07-01 NOTE — Op Note (Signed)
Indications: Ms. Lastar Fedeli is a 52 y.o. 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   Preoperative Note:   Risks of surgery discussed include: infection, bleeding, stroke, coma, death, paralysis, CSF leak, nerve/spinal cord injury, numbness, tingling, weakness, complex regional pain syndrome, recurrent stenosis and/or disc herniation, vascular injury, development of instability, neck/back pain, need for further surgery, persistent symptoms, development of deformity, and the risks of anesthesia. The patient understood these risks and agreed to proceed.  Operative Note:   Operative Procedure: 1. Anterior Cervical Discectomy C4-5 including bilateral foraminotomies and end plate preparation  2. Anterior Cervical Discectomy C5-6 including bilateral foraminotomies and end plate preparation  3. Anterior Cervical Discectomy C6-7 including bilateral foraminotomies and end plate preparation 4. Anterior Spinal Instrumentation C4 to 7  5. Anterior arthrodesis from C4 to C7 with placement of biomechanical devices at C4-5, C5-6, and C6-7 6. Use of the operative microscope 7. Use of intraoperative flouroscopy  PROCEDURE IN DETAIL: After obtaining informed consent, the patient taken to the operating room, placed in supine position, general anesthesia induced.  The patient had a small shoulder roll placed behind their shoulders.  The patient received preop antibiotics  and IV Decadron.  The patient had a neck incision outlined, was prepped and draped in usual sterile fashion. The incision was injected with local anesthetic.   An incision was opened, dissection taken down medial to the carotid artery and jugular vein, lateral to the trachea and esophagus.  The prevertebral fascia identified and a localizing x-ray demonstrated the correct level.  The longus colli were dissected laterally, and self-retaining retractors placed to open the operative field. The microscope was then brought into the field.  With this complete, distractor pins were placed in the vertebral bodies of C4 and C5.  The distractor was placed at C4-5.  The annulus at C4-5 was opened. Curettes and pituitary rongeurs used to remove the majority of disk, then the drill was used to remove the posterior osteophyte, expose the posterior longitudinal ligament, and begin the foraminotomies. The nerve hook was used to elevate the posterior longitudinal ligament, which was then removed with Kerrison rongeurs to complete decompression of the spinal cord. The Kerrison rongeurs were then used to complete the foraminotomies bilaterally to decompress the nerve roots. The nerve hook could be passed out each foramen, ensuring decompression of the nerve roots. Meticulous hemostasis was obtained. A biomechanical device (Globus Hedron C 6 mm height x 14 mm width by 12 mm depth) was placed at C4/5. The device had been filled with demineralized bone matrix for aid in arthrodesis.  Please note that the procedure included removal of the disc, removal of the posterior osteophytes, and removal of the posterior longitudinal ligament to ensure decompression of the spinal cord.  Additionally, foraminotomies were performed on both sides of the spinal canal to decompress the nerve roots.   The caspar distractor was removed. The pin at C4 was removed and bone wax used for hemostasis.  The pin was then repositioned at C7 and the  distractor placed from C5-C7.  The annulus of C5-6 was opened.  Curettes and pituitary rongeurs used to remove the majority of disk, then the drill was used to remove the posterior osteophyte, expose the posterior longitudinal ligament, and begin the foraminotomies. The nerve hook was used to elevate the posterior longitudinal ligament, which was then removed with Kerrison rongeurs to complete decompression of the spinal cord. The Kerrison rongeurs were then used to complete the foraminotomies bilaterally to decompress the nerve roots. The nerve hook could be passed out each foramen, ensuring decompression of the nerve roots. Meticulous hemostasis was obtained. A biomechanical device (Globus Hedron C 7 mm height x 14 mm width by 12 mm depth) was placed at C5/6. The device had been filled with demineralized bone matrix for aid in arthrodesis.  Please note that the procedure included removal of the disc, removal of the posterior osteophytes, and removal of the posterior longitudinal ligament to ensure decompression of the spinal cord.  Additionally, foraminotomies were performed on both sides of the spinal canal to decompress the nerve roots.  The annulus of C6-7 was opened.  Curettes and pituitary rongeurs used to remove the majority of disk, then the drill was used to remove the posterior osteophyte, expose the posterior longitudinal ligament, and begin the foraminotomies. The nerve hook was used to elevate the posterior longitudinal ligament, which was then removed with Kerrison rongeurs to complete decompression of the spinal cord. The Kerrison rongeurs were then used to complete the foraminotomies bilaterally to decompress the nerve roots. The nerve hook could be passed out each foramen, ensuring decompression of the nerve roots. Meticulous hemostasis was obtained. A biomechanical device (Globus Hedron C 7 mm height x 14 mm width by 12 mm depth) was placed at C6/7. The device had been filled with  demineralized bone matrix for aid in arthrodesis.  Please note that the procedure included removal of the disc, removal of the posterior osteophytes, and removal of the posterior longitudinal ligament to ensure decompression of the spinal cord.  Additionally, foraminotomies were performed on both sides of the spinal canal to decompress the nerve roots.  A four segment, three level plate (52 mm Nuvasive ACP) was chosen.  Two screws placed in the vertebral bodies of all four segments, respectively making sure the screws were behind the locking mechanism.  Final AP and lateral radiographs were taken.   Please note that the plate is not inclusive to the biomechanical devices.  The anchoring mechanism of the plate is completely separate from the biomechanical devices.  With everything in good position, the wound was irrigated copiously and meticulous hemostasis obtained.  Wound was closed in 2 layers using interrupted inverted 3-0 Vicryl sutures and monocryl.  The wound was dressed with dermabond, the head of bed at 30 degrees, taken to recovery room in stable condition.  No new postop neurological deficits were identified..  All counts were correct at the end of the case.  I performed the entire procedure with the assistance of Joan Flores PA as an Designer, television/film set. An assistant was required for this procedure due to the complexity.  The assistant provided assistance in tissue manipulation and suction, and was required for the successful and safe performance of the procedure. I performed the critical portions of the procedure.   Venetia Night MD

## 2023-07-01 NOTE — Anesthesia Postprocedure Evaluation (Signed)
Anesthesia Post Note  Patient: Nicole Horton  Procedure(s) Performed: C4-7 ANTERIOR CERVICAL DISCECTOMY AND FUSION  Patient location during evaluation: PACU Anesthesia Type: General Level of consciousness: awake and alert, oriented and patient cooperative Pain management: pain level controlled Vital Signs Assessment: post-procedure vital signs reviewed and stable Respiratory status: spontaneous breathing, nonlabored ventilation and respiratory function stable Cardiovascular status: blood pressure returned to baseline and stable Postop Assessment: adequate PO intake Anesthetic complications: no   No notable events documented.   Last Vitals:  Vitals:   07/01/23 1105 07/01/23 1115  BP: 114/65 116/60  Pulse: 87 84  Resp: 16 17  Temp: (!) 36.2 C   SpO2: 100% 100%    Last Pain:  Vitals:   07/01/23 0630  TempSrc: Temporal  PainSc: 7                  Reed Breech

## 2023-07-01 NOTE — Interval H&P Note (Signed)
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  The various methods of treatment have been discussed with the patient and family. After consideration of risks, benefits and other options for treatment, the patient has consented to  Procedure(s): C4-7 ANTERIOR CERVICAL DISCECTOMY AND FUSION (N/A) as a surgical intervention.  The patient's history has been reviewed, patient examined, no change in status, stable for surgery.  I have reviewed the patient's chart and labs.  Questions were answered to the patient's satisfaction.    Heart sounds normal no MRG. Chest Clear to Auscultation Bilaterally.   Willow Reczek

## 2023-07-01 NOTE — Progress Notes (Addendum)
[place] J collar obtained from ED, ok per MD.

## 2023-07-01 NOTE — Anesthesia Procedure Notes (Signed)
Procedure Name: Intubation Date/Time: 07/01/2023 7:26 AM  Performed by: Morene Crocker, CRNAPre-anesthesia Checklist: Patient identified, Patient being monitored, Timeout performed, Emergency Drugs available and Suction available Patient Re-evaluated:Patient Re-evaluated prior to induction Oxygen Delivery Method: Circle system utilized Preoxygenation: Pre-oxygenation with 100% oxygen Induction Type: IV induction Ventilation: Mask ventilation without difficulty Laryngoscope Size: 3 and McGrath Grade View: Grade I Tube type: Oral Tube size: 7.0 mm Number of attempts: 1 Airway Equipment and Method: Stylet Placement Confirmation: ETT inserted through vocal cords under direct vision, positive ETCO2 and breath sounds checked- equal and bilateral Secured at: 21 cm Tube secured with: Tape Dental Injury: Teeth and Oropharynx as per pre-operative assessment  Comments: Smooth atraumatic intubation. No complications noted.

## 2023-07-01 NOTE — Plan of Care (Signed)
Problem: Education: Goal: Ability to verbalize activity precautions or restrictions will improve Outcome: Progressing Goal: Knowledge of the prescribed therapeutic regimen will improve Outcome: Progressing Goal: Understanding of discharge needs will improve Outcome: Progressing   Problem: Activity: Goal: Ability to avoid complications of mobility impairment will improve Outcome: Progressing Goal: Ability to tolerate increased activity will improve Outcome: Progressing Goal: Will remain free from falls Outcome: Progressing   Problem: Pain Management: Goal: Pain level will decrease Outcome: Progressing   Problem: Skin Integrity: Goal: Will show signs of wound healing Outcome: Progressing

## 2023-07-01 NOTE — Transfer of Care (Signed)
Immediate Anesthesia Transfer of Care Note  Patient: Nicole Horton  Procedure(s) Performed: C4-7 ANTERIOR CERVICAL DISCECTOMY AND FUSION  Patient Location: PACU  Anesthesia Type:General  Level of Consciousness: drowsy  Airway & Oxygen Therapy: Patient Spontanous Breathing and Patient connected to face mask oxygen  Post-op Assessment: Report given to RN and Post -op Vital signs reviewed and stable  Post vital signs: Reviewed and stable  Last Vitals:  Vitals Value Taken Time  BP 114/65 07/01/23 1104  Temp 35.8 1104  Pulse 93 07/01/23 1109  Resp 19 07/01/23 1109  SpO2 100 % 07/01/23 1109  Vitals shown include unfiled device data.  Last Pain:  Vitals:   07/01/23 0630  TempSrc: Temporal  PainSc: 7          Complications: No notable events documented.

## 2023-07-01 NOTE — Anesthesia Preprocedure Evaluation (Addendum)
Anesthesia Evaluation  Patient identified by MRN, date of birth, ID band Patient awake    Reviewed: Allergy & Precautions, NPO status , Patient's Chart, lab work & pertinent test results  History of Anesthesia Complications Negative for: history of anesthetic complications  Airway Mallampati: I   Neck ROM: Full    Dental  (+) Missing   Pulmonary asthma , Current Smoker (1/3 ppd) and Patient abstained from smoking.   Pulmonary exam normal breath sounds clear to auscultation       Cardiovascular hypertension, Normal cardiovascular exam+ dysrhythmias (a fib; SVT s/p ablation)  Rhythm:Regular Rate:Normal  ECG 06/22/23:  Normal sinus rhythm  Nonspecific ST and T wave abnormality   Echo 09/04/21:  1. Normal left ventricular systolic function with an EF of 64.3% 2. No regional wall motion abnormalities 3. Right ventricular size and function normal 4. Mild mitral valve regurgitation 5. Normal gradients; no valvular stenosis    Neuro/Psych  PSYCHIATRIC DISORDERS Anxiety     negative neurological ROS     GI/Hepatic ,GERD  ,,  Endo/Other  negative endocrine ROS    Renal/GU Renal disease (nephrolithiasis)     Musculoskeletal   Abdominal   Peds  Hematology negative hematology ROS (+)   Anesthesia Other Findings Reviewed and agree with Edd Fabian pre-anesthesia clinical review note.    Cardiology note 06/22/23:  52 y.o. female with  1. Palpitations  2. Essential hypertension  3. SVT (supraventricular tachycardia) (CMS/HHS-HCC)  4. Atrial fibrillation, unspecified type (CMS/HHS-HCC)  5. Myocardial bridge (HHS-HCC)  6. Hyperlipidemia, unspecified hyperlipidemia type  7. Obesity (BMI 30-39.9), unspecified  8. Gastroesophageal reflux disease without esophagitis  9. 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 Cigarette smoker    Plan  Preoperative clearance for neck procedure patient is cleared from a cardiac standpoint for upcoming cervical spine procedure mild acceptable risk smoking by history of respiratory failure from tobacco abuse History of SVT chronic stable status post ablation continue current therapy Palpitation intermittent recurrent continue current therapy Hypertension reasonably controlled continue diet and exercise Anxiety intermittent recurrent recommend reassurance Have the patient follow-up in 6 months  Return in about 6 months (around 12/20/2023).    Reproductive/Obstetrics                             Anesthesia Physical Anesthesia Plan  ASA: 3  Anesthesia Plan: General   Post-op Pain Management:    Induction: Intravenous  PONV Risk Score and Plan: 2 and Ondansetron, Dexamethasone and Treatment may vary due to age or medical condition  Airway Management Planned: Oral ETT  Additional Equipment:   Intra-op Plan:   Post-operative Plan: Extubation in OR  Informed Consent: I have reviewed the patients History and Physical, chart, labs and discussed the procedure including the risks, benefits and alternatives for the proposed anesthesia with the patient or authorized representative who has indicated his/her understanding and acceptance.     Dental advisory given  Plan Discussed with: CRNA  Anesthesia Plan Comments: (Patient consented for risks of anesthesia including but not limited to:  - adverse reactions to medications - damage to eyes, teeth, lips or other oral mucosa - nerve damage due to positioning  - sore throat or hoarseness - damage to heart, brain, nerves, lungs, other parts of body or loss of life  Informed patient about role of CRNA in peri- and intra-operative care.  Patient voiced understanding.)  Anesthesia Quick Evaluation

## 2023-07-02 DIAGNOSIS — M50021 Cervical disc disorder at C4-C5 level with myelopathy: Secondary | ICD-10-CM | POA: Diagnosis not present

## 2023-07-02 NOTE — Plan of Care (Signed)
  Problem: Activity: Goal: Ability to avoid complications of mobility impairment will improve 07/02/2023 0743 by Loraine Maple, RN Outcome: Progressing 07/02/2023 0740 by Loraine Maple, RN Outcome: Progressing

## 2023-07-02 NOTE — Evaluation (Signed)
Occupational Therapy Evaluation Patient Details Name: Nicole Horton MRN: [id number] DOB: [birth year] Today's Date: 07/02/2023   History of Present Illness Pt presents POD #1 for C4-C7 discetomy and fusion.   Clinical Impression   Pt seen for OT evaluation this date.  Pt lives at home in a one story home with her daughter and 3 grandchildren.  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  She was independent prior to admission with ADL and IADL tasks.  She is able to demonstrate independence in basic self care tasks this date.  She will likely need some assistance with higher level IADL and homemaking tasks which require overhead reaching, lifting but feels her daughter can assist.  She is able to demonstrate good legibility with hand writing, has some numbness bilaterally in hands.  No further OT needs at this time.  Pt planning to return home today.       If plan is discharge home, recommend the following: Assist for transportation;Assistance with cooking/housework    Functional Status Assessment  Patient has had a recent decline in their functional status and demonstrates the ability to make significant improvements in function in a reasonable and predictable amount of time.  Equipment Recommendations       Recommendations for Other Services       Precautions / Restrictions Precautions Precautions: Cervical Precaution Booklet Issued: No Precaution Comments: No lifting >=10 lbs and no lifting overhead. No bending down to lift objects and to bend at hips. Restrictions Weight Bearing Restrictions: No      Mobility Bed Mobility Overal bed mobility: Independent                  Transfers Overall transfer level: Independent                        Balance Overall balance assessment: Independent                                         ADL either performed or assessed with clinical judgement   ADL Overall ADL's : At baseline                                        General ADL Comments: Pt appears to be at baseline for self care tasks, she is able to perform UB/LB dressing without assistance.  Able to demonstrate toileting with independence from regular height.  Functional reaching and will need assistance with any tasks which require overhead reach.  Pt reports she lives with her daughter who will be able to assist at discharge.  She is a Engineer, civil (consulting) full time.  She is right handed and reports some bilateral numbness in her hands, able to demonstrate writing her signature with good legiblity.  She reports the numbness she had in her left arm has improved slightly more proximally since the surgery and knows distally it will take some time.     Vision         Perception         Praxis         Pertinent Vitals/Pain Pain Assessment Pain Assessment: No/denies pain     Extremity/Trunk Assessment Upper Extremity Assessment Upper Extremity Assessment: Generalized weakness   Lower Extremity Assessment Lower Extremity Assessment:  Defer to PT evaluation   Cervical / Trunk Assessment Cervical / Trunk Assessment: Neck Surgery   Communication Communication Communication: No apparent difficulties   Cognition Arousal: Alert Behavior During Therapy: WFL for tasks assessed/performed Overall Cognitive Status: Within Functional Limits for tasks assessed                                       General Comments       Exercises     Shoulder Instructions      Home Living Family/patient expects to be discharged to:: Private residence Living Arrangements: Children Available Help at Discharge: Family Type of Home: House Home Access: Stairs to enter   Entrance Stairs-Rails: Right Home Layout: One level     Bathroom Shower/Tub: Chief Strategy Officer: Standard         Additional Comments: [place] J brace      Prior Functioning/Environment Prior Level of Function :  Independent/Modified Independent                        OT Problem List: Decreased strength;Impaired sensation      OT Treatment/Interventions:      OT Goals(Current goals can be found in the care plan section) Acute Rehab OT Goals Patient Stated Goal: Pt would like to return home and be as independent as possible. OT Goal Formulation: With patient Time For Goal Achievement: 07/02/23 Potential to Achieve Goals: Good  OT Frequency:      Co-evaluation              AM-PAC OT "6 Clicks" Daily Activity     Outcome Measure Help from another person eating meals?: None Help from another person taking care of personal grooming?: None Help from another person toileting, which includes using toliet, bedpan, or urinal?: None Help from another person bathing (including washing, rinsing, drying)?: None Help from another person to put on and taking off regular upper body clothing?: None Help from another person to put on and taking off regular lower body clothing?: None 6 Click Score: 24   End of Session Equipment Utilized During Treatment: Cervical collar  Activity Tolerance: Patient tolerated treatment well Patient left: in bed  OT Visit Diagnosis: Muscle weakness (generalized) (M62.81)                Time: 0102-7253 OT Time Calculation (min): 19 min Charges:  OT General Charges $OT Visit: 1 Visit OT Evaluation $OT Eval Low Complexity: 1 Low Asriel Westrup T Rhyder Bratz, OTR/L, CLT Corey Laski 07/02/2023, 1:55 PM

## 2023-07-02 NOTE — Progress Notes (Signed)
Neurosurgery Progress Note  History: Nicole Horton is POD 1 s/p C4-7 ACDF for cervical stenosis/myelopathy  Doing well post op.  Reports pain controlled.  Improved sensation in the Ues.  Still with parasthesias.  Tolerating diet and OOB  Physical Exam: Vitals:   07/02/23 0523 07/02/23 0800  BP: (!) 105/54 (!) 91/58  Pulse: (!) 50 (!) 52  Resp: 20 19  Temp: (!) 97.5 F (36.4 C) 98 F (36.7 C)  SpO2: 96% 99%    AOx3 CNS grossly intact  Sensation: Intact to touch in the upper and lower extremities 5/5 strength in the Ues and Les Incision: CDI, flat JP ss (60 cc in 24 hours)   Assessment/Plan:  Nicole Horton POD 1 s/p C4-7 ACDF  -JP drain -pain control -collar when OOB -likely DC home today  Erenest Rasher, MD Neurosurgery

## 2023-07-02 NOTE — Plan of Care (Signed)
Problem: Activity: Goal: Ability to avoid complications of mobility impairment will improve Outcome: Progressing    Problem: Activity: Goal: Will remain free from falls Outcome: Progressing

## 2023-07-02 NOTE — Evaluation (Signed)
Physical Therapy Evaluation Patient Details Name: Nicole Horton MRN: [id number] DOB: May 04, 1971 Today's Date: 07/02/2023  History of Present Illness  Pt presents POD #1 for C4-C7 discetomy and fusion.  Clinical Impression  Pt presents alert and oriented with daughter present, supine in bed, and agreeable to PT. She demonstrates mobility that is near her functional baseline and that signifies she is safe to return home without additional PT in house. She does demonstrate decreased cervical ROM and strength along with increased cervical pain that warrants her receiving additional outpatient PT. PT provided pt number of exercises which pt was able to perform with independence to improve cervical ROM and strength.         If plan is discharge home, recommend the following: Assistance with cooking/housework;Assistance with feeding;Help with stairs or ramp for entrance   Can travel by private vehicle        Equipment Recommendations    Recommendations for Other Services       Functional Status Assessment Patient has had a recent decline in their functional status and demonstrates the ability to make significant improvements in function in a reasonable and predictable amount of time.     Precautions / Restrictions Precautions Precautions: Cervical Precaution Booklet Issued: No Precaution Comments: Book not available but reiterated no lifting >=10 lbs and no lifting overhead. No bending down to lift objects and to bend at hips. Restrictions Weight Bearing Restrictions: No      Mobility  Bed Mobility Overal bed mobility: Independent                  Transfers Overall transfer level: Independent                      Ambulation/Gait Ambulation/Gait assistance: Independent Gait Distance (Feet): 200 Feet   Gait Pattern/deviations: WFL(Within Functional Limits)   Gait velocity interpretation: <1.31 ft/sec, indicative of household ambulator       Stairs Stairs: Yes Stairs assistance: Supervision Stair Management: Two rails Number of Stairs: 4    Wheelchair Mobility     Tilt Bed    Modified Rankin (Stroke Patients Only)       Balance Overall balance assessment: Independent                                           Pertinent Vitals/Pain Pain Assessment Pain Assessment: No/denies pain    Home Living Family/patient expects to be discharged to:: Private residence Living Arrangements: Children Available Help at Discharge: Family Type of Home: House Home Access: Stairs to enter Entrance Stairs-Rails: Right Entrance Stairs-Number of Steps: 4   Home Layout: One level   Additional Comments: [place] J brace already issued    Prior Function Prior Level of Function : Independent/Modified Independent                     Extremity/Trunk Assessment   Upper Extremity Assessment Upper Extremity Assessment: Defer to OT evaluation    Lower Extremity Assessment Lower Extremity Assessment: Overall WFL for tasks assessed    Cervical / Trunk Assessment Cervical / Trunk Assessment: Neck Surgery  Communication   Communication Communication: No apparent difficulties  Cognition Arousal: Alert Behavior During Therapy: WFL for tasks assessed/performed Overall Cognitive Status: Within Functional Limits for tasks assessed  General Comments      Exercises Other Exercises Other Exercises: Cervical Rotation AROM 2 X 5 with 3 sec hold Other Exercises: Cervical Flexion AROM 1 x 5 with 3 sec hold Other Exercises: Cervical Side Bend AROM 2 x 5 sec hold Other Exercises: Shoulder Shrugs 1 x 10 with 2 sec hold Other Exercises: Chin Tucks 1 x 10   Assessment/Plan    PT Assessment All further PT needs can be met in the next venue of care  PT Problem List Decreased range of motion;Decreased skin integrity;Pain;Decreased strength       PT  Treatment Interventions      PT Goals (Current goals can be found in the Care Plan section)  Acute Rehab PT Goals Patient Stated Goal: To return home safely PT Goal Formulation: All assessment and education complete, DC therapy    Frequency       Co-evaluation               AM-PAC PT "6 Clicks" Mobility  Outcome Measure Help needed turning from your back to your side while in a flat bed without using bedrails?: None Help needed moving from lying on your back to sitting on the side of a flat bed without using bedrails?: None Help needed moving to and from a bed to a chair (including a wheelchair)?: None Help needed standing up from a chair using your arms (e.g., wheelchair or bedside chair)?: None Help needed to walk in hospital room?: None Help needed climbing 3-5 steps with a railing? : None 6 Click Score: 24    End of Session Equipment Utilized During Treatment: Gait belt Activity Tolerance: Patient tolerated treatment well Patient left: in bed;with family/visitor present Nurse Communication: Mobility status PT Visit Diagnosis: Pain Pain - part of body:  (Neck)    Time: 4034-7425 PT Time Calculation (min) (ACUTE ONLY): 15 min   Charges:   PT Evaluation $PT Eval Low Complexity: 1 Low PT Treatments $Therapeutic Exercise: 8-22 mins PT General Charges $$ ACUTE PT VISIT: 1 Visit         Ellin Goodie PT, DPT  [facility] [address], [place], [address] Phone: [phone]   Fax:  [phone]

## 2023-07-05 ENCOUNTER — Telehealth: Payer: Self-pay | Admitting: Neurosurgery

## 2023-07-05 MED ORDER — CELECOXIB 200 MG PO CAPS: 200.0000 mg | ORAL_CAPSULE | Freq: Two times a day (BID) | ORAL | 0 refills | Status: AC

## 2023-07-05 NOTE — Telephone Encounter (Signed)
I spoke with the patient. Her pain is at the base of her neck and in between her shoulder blades. We discussed that it is not unusual to experience this after the surgery that she had.  She has only been taking tylenol during the day and flexeril at night. She feels her pain is under control during the day. She cannot get comfortable at night. She did not pick up the tramadol that was sent on 06/14/23.   Per discussion with Dr Myer Haff, I have sent an rx for celebrex 200mg  BID. He would like her to try adding this to her regimen. She will also pick up the tramadol. He is hopeful she will be able to minimize or avoid needing the tramadol once she has the celebrex on board. I have explained this to Nicole Horton and she is agreeable to this plan. She will call us back if this is not helping.

## 2023-07-05 NOTE — Discharge Summary (Signed)
Physician Discharge Summary  Patient ID: Nicole Horton MRN: [id number] DOB/AGE: September 02, 1970 52 y.o.  Admit date: 07/01/2023 Discharge date: 07/02/2023  Admission Diagnoses:Principal Problem:   Cervical myelopathy (HCC) Active Problems:   Myelomalacia (HCC)   Cervical stenosis of spine   Cervical radiculopathy  Discharge Diagnoses:  Principal Problem:   Cervical myelopathy (HCC) Active Problems:   Myelomalacia (HCC)   Cervical stenosis of spine   Cervical radiculopathy   Discharged Condition: good  Hospital Course: Nicole Horton presented to the hospital with cervical myelopathy.  She underwent surgical intervention on July 01, 2023.  She tolerated the procedure well and was admitted postoperatively for evaluation and observation.  She cleared all discharge parameters on postoperative day 1.  Consults: None  Significant Diagnostic Studies: radiology: X-Ray: radiographs showing appropriate placement of implants  Treatments: surgery: ACDF C4-7  Discharge Exam: Blood pressure (!) 91/58, pulse (!) 52, temperature 98 F (36.7 C), temperature source Oral, resp. rate 19, height 5\' 1"  (1.549 m), weight 71.7 kg, last menstrual period 06/10/2017, SpO2 99%. General appearance: alert and cooperative Neck soft MAEW  Disposition: Discharge disposition: 01-Home or Self Care        Allergies as of 07/02/2023       Reactions   Other Nausea Only, Other (See Comments)   Pholcodine   Codeine Nausea And Vomiting   Robaxin [methocarbamol] Nausea And Vomiting        Medication List     TAKE these medications    albuterol 108 (90 Base) MCG/ACT inhaler Commonly known as: VENTOLIN HFA Inhale 1-2 puffs into the lungs every 6 (six) hours as needed for wheezing or shortness of breath.   ALPRAZolam 0.25 MG tablet Commonly known as: XANAX Take 0.25 mg by mouth 2 (two) times daily as needed for anxiety.   cyclobenzaprine 10 MG tablet Commonly known as: FLEXERIL Take 10  mg by mouth at bedtime as needed for muscle spasms.   ipratropium-albuterol 0.5-2.5 (3) MG/3ML Soln Commonly known as: DUONEB Take 3 mLs by nebulization every 4 (four) hours as needed (wheezing, shortness of breath or coughing).   ondansetron 4 MG disintegrating tablet Commonly known as: ZOFRAN-ODT Take 1 tablet (4 mg total) by mouth every 8 (eight) hours as needed for nausea or vomiting.   traMADol 50 MG tablet Commonly known as: Ultram Take 1 tablet (50 mg total) by mouth every 8 (eight) hours as needed for severe pain (pain score 7-10).         SignedVenetia Horton 07/05/2023, 11:16 AM

## 2023-07-05 NOTE — Telephone Encounter (Signed)
C4-7 ACDF on 07/01/23  She is in pain, at night the pain is excruciating. She is only sleeping about 20 mins at a time. She is only taking flexeril and tylenol. She can not find a comfortable position. She felt that last night was her worst night since surgery. She has been taking xanax daily to help with her situation. Is the pain she experiencing normal? [facility] 

## 2023-07-08 ENCOUNTER — Emergency Department
Admission: EM | Admit: 2023-07-08 | Discharge: 2023-07-08 | Disposition: A | Discharge status: Home / Self Care | Payer: Managed Care, Other (non HMO) | Attending: Emergency Medicine | Admitting: Emergency Medicine

## 2023-07-08 ENCOUNTER — Emergency Department: Payer: Managed Care, Other (non HMO)

## 2023-07-08 ENCOUNTER — Other Ambulatory Visit: Payer: Self-pay

## 2023-07-08 DIAGNOSIS — M79601 Pain in right arm: Secondary | ICD-10-CM | POA: Diagnosis not present

## 2023-07-08 DIAGNOSIS — G8918 Other acute postprocedural pain: Secondary | ICD-10-CM | POA: Insufficient documentation

## 2023-07-08 DIAGNOSIS — M542 Cervicalgia: Secondary | ICD-10-CM | POA: Insufficient documentation

## 2023-07-08 LAB — CBC WITH DIFFERENTIAL/PLATELET
Abs Immature Granulocytes: 0.02 10*3/uL (ref 0.00–0.07)
Basophils Absolute: 0.1 10*3/uL (ref 0.0–0.1)
Basophils Relative: 1 %
Eosinophils Absolute: 0.1 10*3/uL (ref 0.0–0.5)
Eosinophils Relative: 2 %
HCT: 41.1 % (ref 36.0–46.0)
Hemoglobin: 13.9 g/dL (ref 12.0–15.0)
Immature Granulocytes: 0 %
Lymphocytes Relative: 33 %
Lymphs Abs: 3.1 10*3/uL (ref 0.7–4.0)
MCH: 31.2 pg (ref 26.0–34.0)
MCHC: 33.8 g/dL (ref 30.0–36.0)
MCV: 92.2 fL (ref 80.0–100.0)
Monocytes Absolute: 0.7 10*3/uL (ref 0.1–1.0)
Monocytes Relative: 8 %
Neutro Abs: 5.3 10*3/uL (ref 1.7–7.7)
Neutrophils Relative %: 56 %
Platelets: 352 10*3/uL (ref 150–400)
RBC: 4.46 MIL/uL (ref 3.87–5.11)
RDW: 12.4 % (ref 11.5–15.5)
WBC: 9.4 10*3/uL (ref 4.0–10.5)
nRBC: 0 % (ref 0.0–0.2)

## 2023-07-08 LAB — BASIC METABOLIC PANEL
Anion gap: 11 (ref 5–15)
BUN: 14 mg/dL (ref 6–20)
CO2: 20 mmol/L — ABNORMAL LOW (ref 22–32)
Calcium: 9 mg/dL (ref 8.9–10.3)
Chloride: 108 mmol/L (ref 98–111)
Creatinine, Ser: 0.82 mg/dL (ref 0.44–1.00)
GFR, Estimated: >60 mL/min (ref >60)
Glucose, Bld: 129 mg/dL — ABNORMAL HIGH (ref 70–99)
Potassium: 4 mmol/L (ref 3.5–5.1)
Sodium: 139 mmol/L (ref 135–145)

## 2023-07-08 MED ORDER — ONDANSETRON HCL 4 MG/2ML IJ SOLN
4.0000 mg | Freq: Once | INTRAMUSCULAR | Status: AC
  Administered 2023-07-08: 4 mg via INTRAVENOUS
  Filled 2023-07-08: qty 2

## 2023-07-08 MED ORDER — HYDROMORPHONE HCL 1 MG/ML IJ SOLN
0.5000 mg | Freq: Once | INTRAMUSCULAR | Status: AC
  Administered 2023-07-08: 0.5 mg via INTRAVENOUS
  Filled 2023-07-08: qty 0.5

## 2023-07-08 MED ORDER — HYDROMORPHONE HCL 1 MG/ML IJ SOLN
1.0000 mg | Freq: Once | INTRAMUSCULAR | Status: AC
  Administered 2023-07-08: 1 mg via INTRAVENOUS
  Filled 2023-07-08: qty 1

## 2023-07-08 MED ORDER — ONDANSETRON 4 MG PO TBDP: 4.0000 mg | ORAL_TABLET | Freq: Three times a day (TID) | ORAL | 0 refills | Status: DC | PRN

## 2023-07-08 MED ORDER — OXYCODONE HCL 5 MG PO TABS: 5.0000 mg | ORAL_TABLET | Freq: Three times a day (TID) | ORAL | 0 refills | Status: DC | PRN

## 2023-07-08 MED ORDER — ONDANSETRON HCL 4 MG/2ML IJ SOLN
4.0000 mg | Freq: Once | INTRAMUSCULAR | Status: AC
Start: 2023-07-08 — End: 2023-07-08
  Administered 2023-07-08: 4 mg via INTRAVENOUS
  Filled 2023-07-08: qty 2

## 2023-07-08 MED ORDER — OXYCODONE-ACETAMINOPHEN 5-325 MG PO TABS
1.0000 | ORAL_TABLET | Freq: Once | ORAL | Status: AC
  Administered 2023-07-08: 1 via ORAL
  Filled 2023-07-08: qty 1

## 2023-07-08 MED ORDER — POLYETHYLENE GLYCOL 3350 17 G PO PACK: 17.0000 g | PACK | Freq: Every day | ORAL | 0 refills | Status: DC

## 2023-07-08 MED ORDER — SODIUM CHLORIDE 0.9 % IV BOLUS
500.0000 mL | Freq: Once | INTRAVENOUS | Status: AC
  Administered 2023-07-08: 500 mL via INTRAVENOUS

## 2023-07-08 NOTE — ED Notes (Signed)
Dr Cyril Loosen was at bedside.

## 2023-07-08 NOTE — ED Notes (Signed)
Provided applesauce and saltine crackers.

## 2023-07-08 NOTE — ED Triage Notes (Signed)
Pt had C3-C4 discectomy on Friday. Reports uncontrolled pain since then and no relief with prescribed meds. Referred here by their office for evaluation. Also reports pain to R arm. Presents with c-collar in place. Pt alert and oriented. Ambulatory. Extremely tearful.

## 2023-07-08 NOTE — ED Notes (Signed)
Pt in MRI. Will give dilaudid when pt returns.

## 2023-07-08 NOTE — ED Notes (Signed)
ED Provider at bedside. 

## 2023-07-08 NOTE — ED Notes (Signed)
Pt stating she wants to stay in room for 30 minutes to 1 hour to rest before discharging. Pt stating she also wants to wait to take meds. She states that EDP offered her to stay (he meant be admitted) or discharge. Will let charge nurse know in case room is needed. Informed pt we do not usually do this but will discuss with CN.

## 2023-07-08 NOTE — ED Notes (Signed)
Ultrasound at bedside

## 2023-07-08 NOTE — ED Provider Notes (Signed)
[facility] Provider Note    Event Date/Time   First MD Initiated Contact with Patient 07/08/23 [phone]     (approximate)   History   Post-op Problem and Neck Pain   HPI  Nicole Horton is a 52 y.o. female who presents to the ED for evaluation of Post-op Problem and Neck Pain   I reviewed 11/22 operative note with neurosurgery.  C4-7 ACDF due to cervical stenosis and myelopathy.  Patient resents for evaluation of severe right arm pain worsening over the past couple days since her surgery 1 week ago.  Reports some right hand grip strength weakness since the surgery, but this pain to her right arm has been severely worsening in the past day or 2.  No falls or injuries, fevers, swelling  Physical Exam   Triage Vital Signs: ED Triage Vitals  Encounter Vitals Group     BP      Systolic BP Percentile      Diastolic BP Percentile      Pulse      Resp      Temp      Temp src      SpO2      Weight      Height      Head Circumference      Peak Flow      Pain Score      Pain Loc      Pain Education      Exclude from Growth Chart     Most recent vital signs: Vitals:   07/08/23 0630 07/08/23 0645  BP:    Pulse: 80 71  Resp:    Temp:    SpO2: 97% 97%    General: Awake, no distress.  CV:  Good peripheral perfusion.  Resp:  Normal effort.  Abd:  No distention.  MSK:  No deformity noted.  No swelling or skin changes appreciated to the right arm. Neuro:  No focal deficits appreciated.  Mildly decreased grip strength on the right that she reports has been present since the surgery. Other:  C-collar in place which I removed briefly and anterior incision looks clean, Dermabond still in place.  No superimposed infectious features or dehiscence.    ED Results / Procedures / Treatments   Labs (all labs ordered are listed, but only abnormal results are displayed) Labs Reviewed  BASIC METABOLIC PANEL - Abnormal; Notable for the following  components:      Result Value   CO2 20 (*)    Glucose, Bld 129 (*)    All other components within normal limits  CBC WITH DIFFERENTIAL/PLATELET    EKG   RADIOLOGY CT C-spine interpreted by me with hardware in place without acute features  Official radiology report(s): CT Cervical Spine Wo Contrast  Result Date: 07/08/2023 CLINICAL DATA:  Uncontrolled pain since ACDF. EXAM: CT CERVICAL SPINE WITHOUT CONTRAST TECHNIQUE: Multidetector CT imaging of the cervical spine was performed without intravenous contrast. Multiplanar CT image reconstructions were also generated. RADIATION DOSE REDUCTION: This exam was performed according to the departmental dose-optimization program which includes automated exposure control, adjustment of the mA and/or kV according to patient size and/or use of iterative reconstruction technique. COMPARISON:  Cervical radiography from 7 days ago FINDINGS: Alignment: Normal. Skull base and vertebrae: No acute fracture. No primary bone lesion or focal pathologic process. ACDF with ventral plate at W4-X3. No hardware displacement or fracture deformity. Soft tissues and spinal canal: Expected prevertebral soft tissue swelling without  collection along the operative exposure. Thyroid nodule on prior ultrasound not clearly detected. Disc levels: No bony impingement. Significantly improved disc height and bony foraminal patency at the operative levels. Some residual ridging posteriorly at C5-6. Upper chest: Unremarkable IMPRESSION: Recent C4-C7 ACDF without complicating feature. Electronically Signed   By: Tiburcio Pea M.D.   On: 07/08/2023 06:57    PROCEDURES and INTERVENTIONS:  Procedures  Medications  HYDROmorphone (DILAUDID) injection 0.5 mg (has no administration in time range)  HYDROmorphone (DILAUDID) injection 1 mg (1 mg Intravenous Given 07/08/23 0634)  ondansetron (ZOFRAN) injection 4 mg (4 mg Intravenous Given 07/08/23 0634)     IMPRESSION / MDM / ASSESSMENT  AND PLAN / ED COURSE  I reviewed the triage vital signs and the nursing notes.  Differential diagnosis includes, but is not limited to, DVT, postop abscess or seroma, hardware malfunction  {Patient presents with symptoms of an acute illness or injury that is potentially life-threatening.  52 year old woman who is 1 week out from a cervical ACDF presents with severely worsening right sided arm pain.  Is tearful and clearly uncomfortable on arrival, improving with IV analgesia.  Consult with neurosurgery who recommends CT and MRI, which is in process.  Screening blood work is benign with a normal CBC and metabolic panel.  CT is reassuring, pending MRI.  Will also get an ultrasound to rule out DVT of the right arm.  Clinical Course as of 07/08/23 1191  [facility] Jul 08, 2023  4782 Dr. Adriana Simas, NSGY, recommends starting with a CT. If that's OK, noncon MRI C spine. Will probably need both [DS]  0709 Reassessed patient looks better, sitting back in bed more comfortable.  We discussed reassuring CT scan and blood work.  Pending MRI.  She reports concern about the sensation of a knot in her right arm contributing to her pain.  I do not appreciate any of this on exam but we discussed getting a venous ultrasound to assess for DVT.  She is agreeable. [DS]    Clinical Course User Index [DS] Delton Prairie, MD     FINAL CLINICAL IMPRESSION(S) / ED DIAGNOSES   Final diagnoses:  Post-operative pain     Rx / DC Orders   ED Discharge Orders     None        Note:  This document was prepared using Dragon voice recognition software and may include unintentional dictation errors.   Delton Prairie, MD 07/08/23 [phone]

## 2023-07-08 NOTE — ED Notes (Signed)
Daughter stated pt just needs "15 more minutes".

## 2023-07-08 NOTE — ED Notes (Signed)
Pt was in MRI and felt like was going to vomit and also having a lot of pain. Pt brought back to room to give zofran and dilaudid. Pt will go to ultrasound after this. Daughter at bedside helping adjust pt position in bed.

## 2023-07-12 ENCOUNTER — Inpatient Hospital Stay
Admission: RE | Admit: 2023-07-12 | Discharge: 2023-07-12 | Disposition: A | Discharge status: Home / Self Care | Payer: Self-pay | Source: Ambulatory Visit | Attending: Neurosurgery | Admitting: Neurosurgery

## 2023-07-12 ENCOUNTER — Telehealth: Payer: Self-pay | Admitting: Neurosurgery

## 2023-07-12 ENCOUNTER — Other Ambulatory Visit: Payer: Self-pay

## 2023-07-12 DIAGNOSIS — Z049 Encounter for examination and observation for unspecified reason: Secondary | ICD-10-CM

## 2023-07-12 MED ORDER — GABAPENTIN 300 MG PO CAPS: 300.0000 mg | ORAL_CAPSULE | Freq: Three times a day (TID) | ORAL | 0 refills | Status: DC

## 2023-07-12 NOTE — Telephone Encounter (Signed)
  Media Information   Document Information  Fyi patient called the clinic after hours.

## 2023-07-12 NOTE — Telephone Encounter (Signed)
Per discussion with Dr Myer Haff, she will start taking cyclobenzaprine three times per day as needed and we will increase her gabapentin to 300mg  three times per day. She will continue the other medications as she was already taking them. I reviewed this with her at length. I informed her that gabapentin can cause side effects such as drowsiness, brain fog, etc. I explained that Dr Myer Haff feels her new MRI and CT look good and that her pain is muscular in nature and will likely improve with time. She has a follow up appointment with our office on 07/14/23, but she knows she can contact our office at any time if she has questions/concerns. She is agreeable with the above plan.

## 2023-07-12 NOTE — Telephone Encounter (Signed)
Sent powershare request for xray that was done at [facility]. Waiting on response.

## 2023-07-12 NOTE — Telephone Encounter (Signed)
Received secure chat from [place]: "Patient is on the phone and is crying due to her pain. She is wanting to figure out a regimen for her pain medication to try and get her pain under control. Can you please speak with her?"

## 2023-07-12 NOTE — Telephone Encounter (Signed)
I spoke with the patient. She reports the medication she is taking isn't touching her pain. She is crying on the phone.  She is taking:  Medrol dosepack Tylenol (1000mg , 3-4 times per day) Gabapentin 100mg  TID Oxycodone 5mg  every 4 hours Cyclobenzaprine - she has not been taking this (she forgot she had this) Celebrex is on hold while on medrol dosepack)  Her pain is in the base of the back of her neck and both of her shoulder blades and both biceps and she can hardly lift her arms.

## 2023-07-13 NOTE — Progress Notes (Unsigned)
   REFERRING PHYSICIAN:  Wilford Corner, Pa-c [address] [place],  [place] [address]  DOS: 07/01/23  ACDF C4-C7 for cervical myelopathy  HISTORY OF PRESENT ILLNESS: Nicole Horton is 2 weeks status post above surgery. Given ultram and flexeril on discharge from the hospital.   She called after surgery with increased pain on 07/05/23- she was not taking ultram. She was to pick that up and was given celebrex as well.   She went to [facility] ED on 07/08/23 with complaints of pain. She had CT and MRI of cervical spine- Dr. Myer Haff reviewed these scans. She also had [place] of right UE that was negative. She was given oxycodone from the ED.   She went to [facility] ED on 07/08/23 as well. She was started on neurontin, medrol dose pack, phenergan.   She called on Tuesday with increased pain. She was increased to neurotnin 300mg  tid and was to continue with her other medications as prescribed.   She is here for her postop visit.   She has pain in her shoulder blades, into her shoulders, and down both arms. She has some numbness and tingling in both arms into her hands. Her preop left arm pain is much better. She has pain in her right arm that is new since the surgery. She notes weakness in right arm as well. No weakness noted in legs.   She has noticed improvement with increasing her neurotin. She is also on flexeril and oxycodone (only taking 2-3 per day). She is still taking medrol dose pack as well.    PHYSICAL EXAMINATION:  NEUROLOGICAL:  General: In no acute distress.   Awake, alert, oriented to person, place, and time.  Pupils equal round and reactive to light.  Facial tone is symmetric.    Strength: Side Biceps Triceps Deltoid Interossei Grip Wrist Ext. Wrist Flex.  R 4- 4 4 5 3 2 3   L 5 4+ 5 4+ 4+ 5 5   She cannot hold right wrist in extension.   Incision c/d/I  Normal gait.   Imaging:  Cervical xrays dated 07/09/23:  No complication noted.   Assessment / Plan: Nicole Horton is doing fair s/p above surgery.   Discussed with Dr. Myer Haff regarding new right arm pain and weakness. He reviewed all of her imaging and it looks good.   Treatment options reviewed with patient and following plan made:   - We discussed activity escalation and I have advised the patient to lift up to 10 pounds until 6 weeks after surgery (until follow up with Dr. Myer Haff).   - Reviewed wound care.  - Continue current medications including prn oxycodone, will increase to q 4 hours prn. Refill given. PMP reviewed and is appropriate.  - Refill of phenergan to pharmacy.  - Continue neurontin 300mg  tid. Continue prn flexeril. Finish out dose pack.  - PT for cervical spine. Orders to [facility].  - EMG of bilateral upper extremities to evaluate right arm weakness. Orders to Dr. Sherryll Burger at [facility] at her request.  - Follow up as scheduled in 4 weeks and prn.   Advised to contact the office if any questions or concerns arise.   Drake Leach PA-C Dept of Neurosurgery

## 2023-07-14 ENCOUNTER — Ambulatory Visit (INDEPENDENT_AMBULATORY_CARE_PROVIDER_SITE_OTHER): Payer: Managed Care, Other (non HMO) | Admitting: Orthopedic Surgery

## 2023-07-14 ENCOUNTER — Encounter: Payer: Self-pay | Admitting: Orthopedic Surgery

## 2023-07-14 VITALS — BP 138/88 | Temp 97.9°F | Ht 61.0 in | Wt 158.0 lb

## 2023-07-14 DIAGNOSIS — Z981 Arthrodesis status: Secondary | ICD-10-CM

## 2023-07-14 DIAGNOSIS — G959 Disease of spinal cord, unspecified: Secondary | ICD-10-CM

## 2023-07-14 MED ORDER — OXYCODONE HCL 5 MG PO TABS: 5.0000 mg | ORAL_TABLET | ORAL | 0 refills | Status: DC | PRN

## 2023-07-14 MED ORDER — PROMETHAZINE HCL 12.5 MG PO TABS: 12.5000 mg | ORAL_TABLET | Freq: Four times a day (QID) | ORAL | 0 refills | Status: DC | PRN

## 2023-07-14 NOTE — Patient Instructions (Addendum)
It was nice to see you today.   Okay to get incision wet in the shower, do not submerge in pool or hot tub.   Call if any concerns about the incision such as redness, drainage, or fever/chills.   No lifting over 10 pounds.   I sent a refill of oxycodoen to your pharmacy. Continue to take the least amount needed and only take for severe pain. Remember this medication can make you sleepy and/or constipated.   Continue flexeril as directed for spasms.   Continue phenergan as directed/needed for nausea. Refill sent.   Continue neurontin 300mg  take 1 three times a day.   Finish out the steroid pack.   I put in orders for PT at [facility]. They should call you or you can call them.   I put in orders for a nerve test/EMG with Dr. Sherryll Burger. They should call you to schedule. Their number is [phone].   We will see you back in 4 weeks for your 6 weeks postop visit.   Please call with any questions or concerns.   Drake Leach PA-C [phone]     The physicians and staff at [facility] at [facility] are committed to providing excellent care. You may receive a survey asking for feedback about your experience at our office. We value you your feedback and appreciate you taking the time to to fill it out. [facility] leadership team is also available to discuss your experience in person, feel free to contact us [phone].

## 2023-07-22 ENCOUNTER — Telehealth: Payer: Self-pay | Admitting: Neurosurgery

## 2023-07-22 DIAGNOSIS — Z981 Arthrodesis status: Secondary | ICD-10-CM

## 2023-07-22 DIAGNOSIS — G959 Disease of spinal cord, unspecified: Secondary | ICD-10-CM

## 2023-07-22 MED ORDER — OXYCODONE HCL 5 MG PO TABS: 5.0000 mg | ORAL_TABLET | ORAL | 0 refills | Status: DC | PRN

## 2023-07-22 MED ORDER — PROMETHAZINE HCL 12.5 MG PO TABS: 12.5000 mg | ORAL_TABLET | Freq: Four times a day (QID) | ORAL | 0 refills | Status: DC | PRN

## 2023-07-22 NOTE — Telephone Encounter (Signed)
DOS: 07/01/23  ACDF C4-C7 for cervical myelopathy   Oxycodone and phenergan refills sent to pharmacy. Please let her know.   PMP reviewed and is appropriate.

## 2023-07-22 NOTE — Telephone Encounter (Signed)
C4-7 ACDF on 07/01/23 Phenergan Oxycodone [facility]

## 2023-07-28 ENCOUNTER — Telehealth: Payer: Self-pay | Admitting: Neurosurgery

## 2023-07-28 DIAGNOSIS — G959 Disease of spinal cord, unspecified: Secondary | ICD-10-CM

## 2023-07-28 DIAGNOSIS — Z981 Arthrodesis status: Secondary | ICD-10-CM

## 2023-07-28 MED ORDER — GABAPENTIN 300 MG PO CAPS: ORAL_CAPSULE | ORAL | 0 refills | Status: DC

## 2023-07-28 MED ORDER — GABAPENTIN 300 MG PO CAPS: 300.0000 mg | ORAL_CAPSULE | Freq: Three times a day (TID) | ORAL | 0 refills | Status: DC

## 2023-07-28 MED ORDER — PROMETHAZINE HCL 12.5 MG PO TABS: 12.5000 mg | ORAL_TABLET | Freq: Four times a day (QID) | ORAL | 0 refills | Status: DC | PRN

## 2023-07-28 MED ORDER — OXYCODONE HCL 5 MG PO TABS: 5.0000 mg | ORAL_TABLET | Freq: Four times a day (QID) | ORAL | 0 refills | Status: DC | PRN

## 2023-07-28 NOTE — Telephone Encounter (Signed)
DOS: 07/01/23  ACDF C4-C7 for cervical myelopathy   Refill of oxycodone okay. Will change directions to q 6 hours prn severe pain. Please let her know.   Refill of phenergan okay as well. She has zofran on her list as well. Make sure she is not taking this with phenergan.   PMP reviewed and is appropriate.

## 2023-07-28 NOTE — Addendum Note (Signed)
Addended byDrake Leach on: 07/28/2023 04:33 PM   Modules accepted: Orders

## 2023-07-28 NOTE — Telephone Encounter (Signed)
Prescription Request  07/28/2023  LOV: 06/21/2023  What is the name of the medication or equipment? promethazine (PHENERGAN) 12.5 MG tablet   oxyCODONE (ROXICODONE) 5 MG immediate release tablet   Have you contacted your pharmacy to request a refill? Yes   Which pharmacy would you like this sent to?  [facility] [address] [place] - [place], [place] - [address] [facility] [address] [place] [address] Phone: [phone] Fax: [phone]    Patient notified that their request is being sent to the clinical staff for review and that they should receive a response within 2 business days.   Please advise at [facility] [phone]

## 2023-07-28 NOTE — Telephone Encounter (Signed)
Visit with Nicole Horton on 07/14/23 confirms she is taking gabapentin 300mg  TID and this is helping. She is scheduled for another appointment on 08/11/2023.  She has been taking gabapentin every 4 hours since 07/15/23. She states she misunderstood Stacy's instructions from her visit on 07/14/23 and thought she was supposed to take all her medications every 4 hours and this has been helping her. I explained to her that gabapentin every 4 hours is too much.   Gabapentin 300mg  rx has been canceled. Per discussion with Nicole Horton, she has been instructed to take oxycodone every 6 hours, resume celebrex (which she has not been taking), and we will send a new rx with an increased dose of gabapentin (with a frequency of three times a day).

## 2023-07-28 NOTE — Telephone Encounter (Signed)
I agree with below.   Neurontin 300mg  sent in to ramp up to 600mg  tid. Given instructions. If she is tolerating 600mg  tid then can do refill for 600mg  next time.

## 2023-07-28 NOTE — Telephone Encounter (Signed)
Patient called back and is also requesting a refill of Gabapentin. She states she only has 8 left. She also states that she had her EMG with Dr. Margaretmary Eddy office yesterday and was told by their office the results would be back today.

## 2023-07-28 NOTE — Telephone Encounter (Signed)
Patient notified

## 2023-07-28 NOTE — Addendum Note (Signed)
Addended by: Sharlot Gowda on: 07/28/2023 04:29 PM   Modules accepted: Orders

## 2023-08-01 ENCOUNTER — Telehealth: Payer: Self-pay

## 2023-08-01 ENCOUNTER — Ambulatory Visit: Payer: Managed Care, Other (non HMO)

## 2023-08-01 NOTE — Telephone Encounter (Signed)
-----   Message from Milan C sent at 08/01/2023  7:42 AM EST ----- EMG results are in care everywhere ----- Message ----- From: Drake Leach, PA-C Sent: 07/14/2023   9:27 AM EST To: Rockey Situ  Ordered EMG of bilateral Ues for her with Dr. Sherryll Burger at [facility].

## 2023-08-04 ENCOUNTER — Telehealth: Payer: Self-pay | Admitting: Neurosurgery

## 2023-08-04 ENCOUNTER — Other Ambulatory Visit: Payer: Self-pay | Admitting: Neurosurgery

## 2023-08-04 DIAGNOSIS — G959 Disease of spinal cord, unspecified: Secondary | ICD-10-CM

## 2023-08-04 DIAGNOSIS — Z981 Arthrodesis status: Secondary | ICD-10-CM

## 2023-08-04 MED ORDER — PROMETHAZINE HCL 12.5 MG PO TABS: 12.5000 mg | ORAL_TABLET | Freq: Four times a day (QID) | ORAL | 0 refills | Status: DC | PRN

## 2023-08-04 MED ORDER — OXYCODONE HCL 5 MG PO TABS: 5.0000 mg | ORAL_TABLET | Freq: Four times a day (QID) | ORAL | 0 refills | Status: DC | PRN

## 2023-08-04 NOTE — Telephone Encounter (Signed)
Promethazine Oxycodone 5mg  every 6 hours. She took the last pill this morning at 6:30am. [facility] in [place]

## 2023-08-04 NOTE — Progress Notes (Signed)
PDMP reviewed and appropriate. Oxycodone and Phenergan refills sent

## 2023-08-04 NOTE — Telephone Encounter (Signed)
I notified her of Dr Lucienne Capers message.

## 2023-08-04 NOTE — Telephone Encounter (Signed)
I notified her that Nicole Horton refilled her meds

## 2023-08-07 NOTE — Telephone Encounter (Signed)
Below noted.  

## 2023-08-08 ENCOUNTER — Ambulatory Visit: Payer: Managed Care, Other (non HMO) | Attending: Family Medicine

## 2023-08-08 DIAGNOSIS — M6281 Muscle weakness (generalized): Secondary | ICD-10-CM | POA: Insufficient documentation

## 2023-08-08 DIAGNOSIS — G959 Disease of spinal cord, unspecified: Secondary | ICD-10-CM | POA: Insufficient documentation

## 2023-08-08 DIAGNOSIS — R278 Other lack of coordination: Secondary | ICD-10-CM | POA: Insufficient documentation

## 2023-08-09 ENCOUNTER — Other Ambulatory Visit: Payer: Self-pay

## 2023-08-09 ENCOUNTER — Telehealth: Payer: Self-pay | Admitting: Neurosurgery

## 2023-08-09 ENCOUNTER — Ambulatory Visit: Payer: Managed Care, Other (non HMO)

## 2023-08-09 DIAGNOSIS — R278 Other lack of coordination: Secondary | ICD-10-CM

## 2023-08-09 DIAGNOSIS — M6281 Muscle weakness (generalized): Secondary | ICD-10-CM | POA: Diagnosis present

## 2023-08-09 DIAGNOSIS — G959 Disease of spinal cord, unspecified: Secondary | ICD-10-CM | POA: Diagnosis present

## 2023-08-09 DIAGNOSIS — Z981 Arthrodesis status: Secondary | ICD-10-CM

## 2023-08-09 MED ORDER — OXYCODONE HCL 5 MG PO TABS: 5.0000 mg | ORAL_TABLET | Freq: Four times a day (QID) | ORAL | 0 refills | Status: DC | PRN

## 2023-08-09 MED ORDER — PROMETHAZINE HCL 12.5 MG PO TABS: 12.5000 mg | ORAL_TABLET | Freq: Four times a day (QID) | ORAL | 0 refills | Status: DC | PRN

## 2023-08-09 NOTE — Telephone Encounter (Signed)
 C4-7 ACDF on 07/01/23  [facility] She took her last oxy today at 11am Appt 08/11/23  Phenergan Oxycodone every 6 hours

## 2023-08-09 NOTE — Telephone Encounter (Signed)
Patient notified

## 2023-08-09 NOTE — Telephone Encounter (Signed)
 DOS: 07/01/23  ACDF C4-C7 for cervical myelopathy   Refill of oxycodone  and phenergan  okay. PMP reviewed and is appropriate.   Please let her know.    She has f/u on 08/11/23 with Dr. Clois. May need to discuss changing oxycodone  to q 8 hours at that time.

## 2023-08-09 NOTE — Therapy (Signed)
 OUTPATIENT OCCUPATIONAL THERAPY NEURO EVALUATION  Patient Name: Nicole Horton MRN: [id number] DOB:02/08/1971, 52 y.o., female Today's Date: 08/09/2023  PCP: Selinda Quan, PA-C REFERRING PROVIDER: Dr. Jannett Fairly  END OF SESSION:  OT End of Session - 08/09/23 1500     Visit Number 1    Number of Visits 24    Date for OT Re-Evaluation 11/01/23    Progress Note Due on Visit 10    OT Start Time 1015    OT Stop Time 1100    OT Time Calculation (min) 45 min    Activity Tolerance Patient tolerated treatment well    Behavior During Therapy [facility] for tasks assessed/performed            Past Medical History:  Diagnosis Date   Abnormal uterine bleeding 05/15/2014   Adnexal pain 08/13/2015   Anxiety    a.) on BZO PRN (alprazolam)   Asthma    Cervical myelopathy with cervical radiculopathy (HCC)    Cervical spinal stenosis    Complication of anesthesia    a.) early emergence from anesthesia/sedation used for cardiac catheterization   COVID-19    Essential (primary) hypertension 07/31/2015   GERD (gastroesophageal reflux disease)    Granulomatous lung disease (HCC)    History of cardiac catheterization 01/18/2014   a.) LHC 01/18/2014: normal coronaries; myocardial bridge dLAD casuing compression/spasm   History of kidney stones    Hyperlipidemia    Multiple thyroid  nodules    a.) cervical MRI 06/08/2023: multiple thyroid  nodules with largest mesauring 1.7 cm   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   a.) LHC 01/18/2014: 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   PAF (paroxysmal atrial fibrillation) (HCC)    a.) CHA2DS2-VASc = 2 (sex, HTN) as of 06/30/2023; b.) cardiac rate/rhythm maintained intrinsically without pharmacological intervention; no chronic OAC   Palpitations (PAC/PVCs)    a.) holter 10/13/2016: rare atrial and ventricular ectopy (< 1.0% study burden)   Pelvic phlebolithiasis    Pneumonia    RLS (restless legs syndrome)    SVT (supraventricular  tachycardia) (HCC)    a.) s/p SVT ablation at [facility] on 04/21/2022   Past Surgical History:  Procedure Laterality Date   ABDOMINAL HYSTERECTOMY     ANTERIOR CERVICAL DECOMP/DISCECTOMY FUSION N/A 07/01/2023   Procedure: C4-7 ANTERIOR CERVICAL DISCECTOMY AND FUSION;  Surgeon: Clois Fret, MD;  Location: [facility] ORS;  Service: Neurosurgery;  Laterality: N/A;   BREAST BIOPSY Right 01/23/2021   Bx showed fibroepithelial (fibroadenoma) lesion   COLONOSCOPY     CYSTOSCOPY  08/11/2017   Procedure: CYSTOSCOPY;  Surgeon: Arloa Lamar SQUIBB, MD;  Location: [facility] ORS;  Service: Gynecology;;   ESOPHAGOGASTRODUODENOSCOPY (EGD) WITH ESOPHAGEAL DILATION     EXCISION OF BREAST LESION Right 02/13/2021   Procedure: EXCISION OF BREAST LESION;  Surgeon: Dessa Reyes ORN, MD;  Location: [facility] ORS;  Service: General;  Laterality: Right;   LAPAROSCOPIC HYSTERECTOMY Bilateral 08/11/2017   Procedure: HYSTERECTOMY TOTAL LAPAROSCOPIC BILATERAL SALPINGECTOMY;  Surgeon: Arloa Lamar SQUIBB, MD;  Location: [facility] ORS;  Service: Gynecology;  Laterality: Bilateral;   LEFT HEART CATH AND CORONARY ANGIOGRAPHY N/A 01/18/2014   Procedure: LEFT HEART CATH AND CORONARY ANGIOGRAPHY; Location: [facility]   SVT ABLATION N/A 04/21/2022   Procedure: SVT ABLATION; Location: [facility]; Surgeon: Franky Ned, MD   TUBAL LIGATION     Patient Active Problem List   Diagnosis Date Noted   Cervical myelopathy (HCC) 07/01/2023   Myelomalacia (HCC) 07/01/2023   Cervical stenosis of spine 07/01/2023   Cervical radiculopathy 07/01/2023  Genetic testing 03/11/2021   PMS2-related Lynch syndrome (HNPCC4) 03/11/2021   Family history of breast cancer 02/24/2021   Family history of ovarian cancer 02/24/2021   Family history of renal cancer 02/24/2021   Family history of brain cancer 02/24/2021   Family history of colon cancer 02/24/2021   Obesity (BMI 30-39.9) 03/10/2018   Colon cancer high risk 08/24/2017   Menometrorrhagia 07/07/2017   Frequent UTI  06/13/2017   Gastroesophageal reflux disease without esophagitis 06/13/2017   RLS (restless legs syndrome) 06/13/2017   Palpitations 09/27/2016   Nephrolithiasis 08/14/2015   Gross hematuria 08/14/2015   Adnexal pain 08/13/2015   Essential (primary) hypertension 07/31/2015   Abnormal uterine bleeding 05/15/2014   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  Pt reports that she had a normal nerve study post surgery. Pt accompanied by: self  PERTINENT HISTORY: Pt is s/p ACDF C4-C7 on 07/01/23 for cervical myelopathy.  Pt described waking up one morning feeling like she had a crick in her neck.  Each week she experienced worsening weakness and numbness in her arms, and by the 3rd week she verbalized not being able to use her L arm prior to sx.  Pt returns for another surgical follow up with Dr. Clois on 08/11/23.  PRECAUTIONS: Other: no lifting more than 10 lbs   WEIGHT BEARING RESTRICTIONS: No  PAIN:  Are you having pain? Yes: NPRS scale: 5-6 at rest, beyond 10 with activity  Pain location: arms and hands  Pain description: numbness/cold, pins and needles, occasional electric shock feeling Aggravating factors: activity  Relieving factors: taking a bath, sleeping, resting , medications  FALLS: Has patient fallen in last 6 months? 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  OBJECTIVE:  Note: Objective measures were completed at Evaluation unless otherwise noted.  HAND DOMINANCE: Right  ADLs: Overall ADLs: compensating with L non-dominant arm to perform activities noted below Transfers/ambulation related to ADLs: indep without AD Eating: assist with cutting food, having to use L non-dominant hand to manipulate eating utensils, and pt describes this as effortful Grooming: brushing hair and teeth with L hand  UB Dressing: assist with donning bra, increased time to don a shirt; unable to manage buttons LB Dressing: unable to manage clothing fasteners; able to don socks and pants with increased time; wears slip on shoes Toileting: using L non-dominant hand for peri care Bathing: daughter assists with washing hair and shaving legs and underarms; pt can wash body with L hand Tub Shower transfers: occassional supv to step over tub depending on if feeling loopy from pain meds Equipment: none  IADLs: Shopping: dep Light housekeeping: difficulty washing dishes d/t limited grasp, assist with laundry d/t wet clothes being too heavy to transfer from  washing machine to dryer.  Pt states she can only load washer and can fold only light weight clothing; unable to push a vacuum Meal Prep: light meal prep only, including making a sandwich or preparing microwaveable items Community mobility: indep without AD Medication management: set up assist d/t lack of coordination  Financial management: indep Handwriting: 100% legible, Increased time, and difficulty holding on to standard pen  MOBILITY STATUS:  see above  POSTURE COMMENTS:  rounded shoulders Sitting balance: Moves/returns truncal midpoint >2 inches in all planes  ACTIVITY TOLERANCE: Activity tolerance: decreased  d/t high pain levels; pt noted to be rocking in her chair through most of eval d/t high pain levels  FUNCTIONAL OUTCOME MEASURES: FOTO: 32; predicted 57  UPPER EXTREMITY ROM:    Active ROM Right eval Left eval  Shoulder flexion 61 108  Shoulder abduction 76 56  Shoulder adduction    Shoulder extension    Shoulder internal rotation Thumb to iliac crest WNL  Shoulder external rotation 37 59  Elbow flexion    Elbow extension    Wrist flexion    Wrist extension -50 WNL  Wrist ulnar deviation    Wrist radial deviation    Wrist pronation WNL WNL  Wrist supination WNL WNL  (Blank rows = not tested)  *Able to oppose each digit to thumb in both hands, but slowly and with a noted tremor in the R hand  UPPER EXTREMITY MMT:     MMT Right eval Left eval  Shoulder flexion 3- 3+  Shoulder abduction 3- 3+  Shoulder adduction    Shoulder extension    Shoulder internal rotation 3- 3+  Shoulder external rotation 3- 3+  Middle trapezius    Lower trapezius    Elbow flexion 3+ 4-  Elbow extension 3+ 4-  Wrist flexion 3+ 4-  Wrist extension 2 4-  Wrist ulnar deviation    Wrist radial deviation    Wrist pronation    Wrist supination    (Blank rows = not tested)  HAND FUNCTION: Grip strength: Right: 0 lbs; Left: 11 lbs, Lateral pinch: Right: 0 lbs, Left: 6 lbs, and 3 point pinch: Right: 2 lbs, Left: 1 lbs  COORDINATION: 9 Hole Peg test: Right: 51 sec; Left: 26 sec  SENSATION: LUE: Tingling/numbness in median nerve distribution throughout LUE extending up to biceps; pt verbalizes constant numbness.  RUE: no tingling in fingers but endorses tingling/numbness from the wrist up to the proximal arm   EDEMA: No visible edema  COGNITION: Overall cognitive status: Within functional limits for tasks assessed  VISION: NT  PRAXIS: Impaired: Motor planning; RUE: mild-moderate distal tremor with attempts at fine motor movements; RUE movement is slow, labored; LUE similar to R, but less  so  OBSERVATIONS:  Pt noted to be rocking in her chair through most of eval d/t high pain levels.  TREATMENT DATE: 08/09/23 Evaluation completed.  Issued built up foam grips to apply to eating utensils, pen, and toothbrush for easier gripping of these utensils.  Issued size small, open-fingered compression gloves to both hands for pain management as pt verbalized pressure to fingers helps to soothe her neuropathy pain.  Good fit noted; pt verbalized immediately feeling relief with gloves donned in each hand.  PATIENT EDUCATION: Education details: OT role, goals, poc Person educated: Patient Education method: Explanation Education comprehension: verbalized understanding  HOME EXERCISE PROGRAM: To be initiated in follow up visits  GOALS: Goals reviewed with patient? Yes  SHORT TERM GOALS: Target date: 09/20/23  Pt will be indep to perform HEP for improving BUE flexibility, strength, and coordination. Baseline: Eval: HEP not yet initiated Goal status: INITIAL  LONG TERM GOALS: Target date: 11/01/23  Pt will increase FOTO score to 57 or better to indicate improvement in self perceived functional use of the R arm with daily tasks. Baseline: Eval: 32 Goal status: INITIAL  2.  Pt will increase L/R grip strength by 10 or more lbs in order to improve ability to hold and carry ADL supplies.    Baseline: Eval: L grip 11 lbs, R grip 0 lbs Goal status: INITIAL  3.  Pt will increase bilat shoulder strength to wash hair independently. Baseline: Eval: daughter washes hair (R shoulder grossly 3-, L shoulder grossly 3+) Goal status: INITIAL  4.  Pt will increase L/R lateral pinch strength to enable pt to manipulate eating and grooming utensils with modified indep. Baseline: Eval: L pinch: 6 lbs, R 0 lbs (pt using L non-dominant hand for above, but with difficulty and frequent  dropping of utensils) Goal status: INITIAL  5.  Pt will increase R hand FMC/dexterity skills to enable pt to manage clothing fasteners with modified indep-indep. Baseline: Eval: Unable to manage clothing fasteners (R 9 hole peg test: 51 sec, L 26 sec) Goal status: INITIAL  ASSESSMENT:  CLINICAL IMPRESSION: Patient is a 52 y.o. female who was seen today for occupational therapy evaluation for functional decline related to cervical myelopathy post ACDF C4-C7 on 07/01/23.  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  Pt presents with substantial BUE limitations, including severe BUE weakness, R dominant arm worse than L, BUE neuropathy with severe pain with activity, lack of coordination with noted mild-moderate distal RUE intention tremor, all impacting ability to perform ADL/IADLs.  Pt is the main caregiver to her 3 grandchildren, ages 30, 40, and 2.  Daughter is currently having to assist pt with cutting her food, washing her hair, and managing IADLs such as laundry tasks, washing dishes, and pushing a vacuum as a result of pt's extensive BUE weakness and lack of coordination.  Pt will benefit from skilled OT to address above noted BUE deficits in order to maximize indep with daily tasks.   PERFORMANCE DEFICITS: in functional skills including ADLs, IADLs, coordination, dexterity, sensation, ROM, strength, pain, Fine motor control, Gross motor control, decreased knowledge of precautions, decreased knowledge of use of DME, and UE functional use, and psychosocial skills including coping strategies, environmental adaptation, habits, and routines and behaviors.   IMPAIRMENTS: are limiting patient from ADLs, IADLs, rest and sleep, work, and leisure.   CO-MORBIDITIES: may have co-morbidities  that affects occupational performance. Patient will benefit from skilled OT to address above impairments and improve overall function.  MODIFICATION OR ASSISTANCE TO COMPLETE  EVALUATION: No modification of tasks or assist necessary to  complete an evaluation.  OT OCCUPATIONAL PROFILE AND HISTORY: Problem focused assessment: Including review of records relating to presenting problem.  CLINICAL DECISION MAKING: Moderate - several treatment options, min-mod task modification necessary  REHAB POTENTIAL: Good  EVALUATION COMPLEXITY: Moderate    PLAN:  OT FREQUENCY: 2x/week  OT DURATION: 12 weeks  PLANNED INTERVENTIONS: 97168 OT Re-evaluation, 97535 self care/ADL training, 02889 therapeutic exercise, 97530 therapeutic activity, 97112 neuromuscular re-education, 97140 manual therapy, 97010 moist heat, 97010 cryotherapy, 97760 Splinting (initial encounter), passive range of motion, psychosocial skills training, energy conservation, coping strategies training, patient/family education, and DME and/or AE instructions  RECOMMENDED OTHER SERVICES: PT referral in chart  CONSULTED AND AGREED WITH PLAN OF CARE: Patient  PLAN FOR NEXT SESSION: see above  Inocente Blazing, MS, OTR/L  Inocente MARLA Blazing, OT 08/09/2023, 3:03 PM

## 2023-08-11 ENCOUNTER — Ambulatory Visit
Admission: RE | Admit: 2023-08-11 | Discharge: 2023-08-11 | Disposition: A | Discharge status: Home / Self Care | Payer: Managed Care, Other (non HMO) | Source: Ambulatory Visit | Attending: Neurosurgery | Admitting: Neurosurgery

## 2023-08-11 ENCOUNTER — Ambulatory Visit: Payer: Managed Care, Other (non HMO) | Admitting: Neurosurgery

## 2023-08-11 ENCOUNTER — Ambulatory Visit: Payer: Managed Care, Other (non HMO)

## 2023-08-11 DIAGNOSIS — G959 Disease of spinal cord, unspecified: Secondary | ICD-10-CM

## 2023-08-11 DIAGNOSIS — Z981 Arthrodesis status: Secondary | ICD-10-CM

## 2023-08-11 MED ORDER — OXYCODONE HCL 5 MG PO TABS: 5.0000 mg | ORAL_TABLET | Freq: Three times a day (TID) | ORAL | 0 refills | Status: AC | PRN

## 2023-08-11 MED ORDER — CYCLOBENZAPRINE HCL 10 MG PO TABS: 10.0000 mg | ORAL_TABLET | Freq: Three times a day (TID) | ORAL | 0 refills | Status: DC | PRN

## 2023-08-11 MED ORDER — GABAPENTIN 300 MG PO CAPS: 600.0000 mg | ORAL_CAPSULE | Freq: Three times a day (TID) | ORAL | 0 refills | Status: DC

## 2023-08-11 NOTE — Progress Notes (Signed)
   REFERRING PHYSICIAN:  Cyrus Selinda Moose, Pa-c [address] [place],  [place] [address]  DOS: 07/01/23  ACDF C4-C7 for cervical myelopathy  HISTORY OF PRESENT ILLNESS: 08/11/2023 Nicole Horton continues to have issues with weakness in her arms.  She also describes an intention tremor.  She reports numbness down her left arm in a C6 distribution.  She continues to have significant neck pain.   07/14/23 (Stacy Luna's note)  Nicole Horton is 2 weeks status post above surgery. Given ultram  and flexeril  on discharge from the hospital.   She called after surgery with increased pain on 07/05/23- she was not taking ultram . She was to pick that up and was given celebrex  as well.   She went to [facility] ED on 07/08/23 with complaints of pain. She had CT and MRI of cervical spine- Dr. Clois reviewed these scans. She also had US  of right UE that was negative. She was given oxycodone  from the ED.   She went to [facility] ED on 07/08/23 as well. She was started on neurontin , medrol  dose pack, phenergan .   She called on Tuesday with increased pain. She was increased to neurotnin 300mg  tid and was to continue with her other medications as prescribed.   She is here for her postop visit.   She has pain in her shoulder blades, into her shoulders, and down both arms. She has some numbness and tingling in both arms into her hands. Her preop left arm pain is much better. She has pain in her right arm that is new since the surgery. She notes weakness in right arm as well. No weakness noted in legs.   She has noticed improvement with increasing her neurotin. She is also on flexeril  and oxycodone  (only taking 2-3 per day). She is still taking medrol  dose pack as well.    PHYSICAL EXAMINATION:  NEUROLOGICAL:  General: In no acute distress.   Awake, alert, oriented to person, place, and time.  Pupils equal round and reactive to light.  Facial tone is symmetric.    Strength: Side Biceps Triceps Deltoid  Interossei Grip Wrist Ext. Wrist Flex.  R 4- 4 5 5 3 2 3   L 5 5 5  4+ 4+ 5 5   She cannot hold right wrist in extension.   Incision c/d/I  Normal gait.   Imaging:  Cervical xrays dated 07/09/23:  No complication noted.   Assessment / Plan: Brentney L Savard is doing fair s/p above surgery.   She has not been reimage with CT, MRI scan, and had nerve conduction study.  All of these have been reassuring.  She continues to show substantial weakness as well as a right-sided wrist drop.  I am at a loss as to why she has had such a difficult recovery.  She will continue physical and Occupational Therapy.  I have refilled her medications.  If she is not improving at her next visit, we may have to repeat her nerve conduction study to show whether an etiology of her weakness is more apparent with time.  Reeves Clois MD Dept of Neurosurgery

## 2023-08-16 ENCOUNTER — Ambulatory Visit: Payer: Managed Care, Other (non HMO) | Attending: Neurology

## 2023-08-16 ENCOUNTER — Ambulatory Visit: Payer: Managed Care, Other (non HMO)

## 2023-08-16 DIAGNOSIS — R278 Other lack of coordination: Secondary | ICD-10-CM | POA: Insufficient documentation

## 2023-08-16 DIAGNOSIS — M6281 Muscle weakness (generalized): Secondary | ICD-10-CM | POA: Diagnosis present

## 2023-08-16 DIAGNOSIS — G959 Disease of spinal cord, unspecified: Secondary | ICD-10-CM | POA: Diagnosis present

## 2023-08-16 NOTE — Therapy (Signed)
 OUTPATIENT OCCUPATIONAL THERAPY NEURO TREATMENT NOTE  Patient Name: Nicole Horton MRN: [id number] DOB:June 03, 1971, 53 y.o., female Today's Date: 08/16/2023  PCP: Selinda Quan, PA-C REFERRING PROVIDER: Dr. Jannett Fairly  END OF SESSION:  OT End of Session - 08/16/23 1019     Visit Number 2    Number of Visits 24    Date for OT Re-Evaluation 11/01/23    Progress Note Due on Visit 10    OT Start Time 1005    OT Stop Time 1055    OT Time Calculation (min) 50 min    Activity Tolerance Patient tolerated treatment well    Behavior During Therapy [facility] for tasks assessed/performed            Past Medical History:  Diagnosis Date   Abnormal uterine bleeding 05/15/2014   Adnexal pain 08/13/2015   Anxiety    a.) on BZO PRN (alprazolam)   Asthma    Cervical myelopathy with cervical radiculopathy (HCC)    Cervical spinal stenosis    Complication of anesthesia    a.) early emergence from anesthesia/sedation used for cardiac catheterization   COVID-19    Essential (primary) hypertension 07/31/2015   GERD (gastroesophageal reflux disease)    Granulomatous lung disease (HCC)    History of cardiac catheterization 01/18/2014   a.) LHC 01/18/2014: normal coronaries; myocardial bridge dLAD casuing compression/spasm   History of kidney stones    Hyperlipidemia    Multiple thyroid  nodules    a.) cervical MRI 06/08/2023: multiple thyroid  nodules with largest mesauring 1.7 cm   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   a.) LHC 01/18/2014: 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   PAF (paroxysmal atrial fibrillation) (HCC)    a.) CHA2DS2-VASc = 2 (sex, HTN) as of 06/30/2023; b.) cardiac rate/rhythm maintained intrinsically without pharmacological intervention; no chronic OAC   Palpitations (PAC/PVCs)    a.) holter 10/13/2016: rare atrial and ventricular ectopy (< 1.0% study burden)   Pelvic phlebolithiasis    Pneumonia    RLS (restless legs syndrome)    SVT (supraventricular  tachycardia) (HCC)    a.) s/p SVT ablation at [facility] on 04/21/2022   Past Surgical History:  Procedure Laterality Date   ABDOMINAL HYSTERECTOMY     ANTERIOR CERVICAL DECOMP/DISCECTOMY FUSION N/A 07/01/2023   Procedure: C4-7 ANTERIOR CERVICAL DISCECTOMY AND FUSION;  Surgeon: Clois Fret, MD;  Location: [facility] ORS;  Service: Neurosurgery;  Laterality: N/A;   BREAST BIOPSY Right 01/23/2021   Bx showed fibroepithelial (fibroadenoma) lesion   COLONOSCOPY     CYSTOSCOPY  08/11/2017   Procedure: CYSTOSCOPY;  Surgeon: Arloa Lamar SQUIBB, MD;  Location: [facility];  Service: Gynecology;;   ESOPHAGOGASTRODUODENOSCOPY (EGD) WITH ESOPHAGEAL DILATION     EXCISION OF BREAST LESION Right 02/13/2021   Procedure: EXCISION OF BREAST LESION;  Surgeon: Dessa Reyes ORN, MD;  Location: [facility];  Service: General;  Laterality: Right;   LAPAROSCOPIC HYSTERECTOMY Bilateral 08/11/2017   Procedure: HYSTERECTOMY TOTAL LAPAROSCOPIC BILATERAL SALPINGECTOMY;  Surgeon: Arloa Lamar SQUIBB, MD;  Location: [facility];  Service: Gynecology;  Laterality: Bilateral;   LEFT HEART CATH AND CORONARY ANGIOGRAPHY N/A 01/18/2014   Procedure: LEFT HEART CATH AND CORONARY ANGIOGRAPHY; Location: [facility]   SVT ABLATION N/A 04/21/2022   Procedure: SVT ABLATION; Location: [facility]; Surgeon: Franky Ned, MD   TUBAL LIGATION     Patient Active Problem List   Diagnosis Date Noted   Cervical myelopathy (HCC) 07/01/2023   Myelomalacia (HCC) 07/01/2023   Cervical stenosis of spine 07/01/2023   Cervical radiculopathy  07/01/2023   Genetic testing 03/11/2021   PMS2-related Lynch syndrome (HNPCC4) 03/11/2021   Family history of breast cancer 02/24/2021   Family history of ovarian cancer 02/24/2021   Family history of renal cancer 02/24/2021   Family history of brain cancer 02/24/2021   Family history of colon cancer 02/24/2021   Obesity (BMI 30-39.9) 03/10/2018   Colon cancer high risk 08/24/2017   Menometrorrhagia 07/07/2017   Frequent UTI  06/13/2017   Gastroesophageal reflux disease without esophagitis 06/13/2017   RLS (restless legs syndrome) 06/13/2017   Palpitations 09/27/2016   Nephrolithiasis 08/14/2015   Gross hematuria 08/14/2015   Adnexal pain 08/13/2015   Essential (primary) hypertension 07/31/2015   Abnormal uterine bleeding 05/15/2014   Malformation of coronary vessel 05/15/2014   Myocardial bridge 05/15/2014   ONSET DATE: 07/01/23  REFERRING DIAG: RUE weakness, cervical myelopathy  THERAPY DIAG:  Muscle weakness (generalized)  Other lack of coordination  Cervical myelopathy (HCC)  Rationale for Evaluation and Treatment: Rehabilitation  SUBJECTIVE:  SUBJECTIVE STATEMENT: Pt reports that wearing the compression gloves have really helped to manage the pain in her hands, and she's interested in looking in to a compression sleeve for her L arm. Pt accompanied by: self  PERTINENT HISTORY: Pt is s/p ACDF C4-C7 on 07/01/23 for cervical myelopathy.  Pt described waking up one morning feeling like she had a crick in her neck.  Each week she experienced worsening weakness and numbness in her arms, and by the 3rd week she verbalized not being able to use her L arm prior to sx.  Pt returns for another surgical follow up with Dr. Clois on 08/11/23.  PRECAUTIONS: Other: no lifting more than 10 lbs   WEIGHT BEARING RESTRICTIONS: No  PAIN: 08/16/23: 5/10 pain arms and hands Are you having pain? Yes: NPRS scale: 5-6 at rest, beyond 10 with activity  Pain location: arms and hands  Pain description: numbness/cold, pins and needles, occasional electric shock feeling Aggravating factors: activity  Relieving factors: taking a bath, sleeping, resting , medications  FALLS: Has patient fallen in last 6 months? 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  OBJECTIVE:  Note: Objective measures were completed at Evaluation unless otherwise noted.  HAND DOMINANCE: Right  ADLs: Overall ADLs: compensating with L non-dominant arm to perform activities noted below Transfers/ambulation related to ADLs: indep without AD Eating: assist with cutting food, having to use L non-dominant hand to manipulate eating utensils, and pt describes this as effortful Grooming: brushing hair and teeth with L hand  UB Dressing: assist with donning bra, increased time to don a shirt; unable to manage buttons LB Dressing: unable to manage clothing fasteners; able to don socks and pants with increased time; wears slip on shoes Toileting: using L non-dominant hand for peri care Bathing: daughter assists with washing hair and shaving legs and underarms; pt can wash body with L hand Tub Shower transfers: occassional supv to step over tub depending on if feeling loopy from pain meds Equipment: none  IADLs: Shopping: dep Light housekeeping: difficulty washing dishes d/t limited grasp, assist with laundry d/t  wet clothes being too heavy to transfer from washing machine to dryer.  Pt states she can only load washer and can fold only light weight clothing; unable to push a vacuum Meal Prep: light meal prep only, including making a sandwich or preparing microwaveable items Community mobility: indep without AD Medication management: set up assist d/t lack of coordination  Financial management: indep Handwriting: 100% legible, Increased time, and difficulty holding on to standard pen  MOBILITY STATUS:  see above  POSTURE COMMENTS:  rounded shoulders Sitting balance: Moves/returns truncal midpoint >2 inches in all planes  ACTIVITY  TOLERANCE: Activity tolerance: decreased d/t high pain levels; pt noted to be rocking in her chair through most of eval d/t high pain levels  FUNCTIONAL OUTCOME MEASURES: FOTO: 32; predicted 57  UPPER EXTREMITY ROM:    Active ROM Right eval Left eval  Shoulder flexion 61 108  Shoulder abduction 76 56  Shoulder adduction    Shoulder extension    Shoulder internal rotation Thumb to iliac crest WNL  Shoulder external rotation 37 59  Elbow flexion    Elbow extension    Wrist flexion    Wrist extension -50 WNL  Wrist ulnar deviation    Wrist radial deviation    Wrist pronation WNL WNL  Wrist supination WNL WNL  (Blank rows = not tested)  *Able to oppose each digit to thumb in both hands, but slowly and with a noted tremor in the R hand  UPPER EXTREMITY MMT:     MMT Right eval Left eval  Shoulder flexion 3- 3+  Shoulder abduction 3- 3+  Shoulder adduction    Shoulder extension    Shoulder internal rotation 3- 3+  Shoulder external rotation 3- 3+  Middle trapezius    Lower trapezius    Elbow flexion 3+ 4-  Elbow extension 3+ 4-  Wrist flexion 3+ 4-  Wrist extension 2 4-  Wrist ulnar deviation    Wrist radial deviation    Wrist pronation    Wrist supination    (Blank rows = not tested)  HAND FUNCTION: Grip strength: Right: 0 lbs; Left: 11 lbs, Lateral pinch: Right: 0 lbs, Left: 6 lbs, and 3 point pinch: Right: 2 lbs, Left: 1 lbs  COORDINATION: 9 Hole Peg test: Right: 51 sec; Left: 26 sec  SENSATION: LUE: Tingling/numbness in median nerve distribution throughout LUE extending up to biceps; pt verbalizes constant numbness.  RUE: no tingling in fingers but endorses tingling/numbness from the wrist up to the proximal arm   EDEMA: No visible edema  COGNITION: Overall cognitive status: Within functional limits for tasks assessed  VISION: NT  PRAXIS: Impaired: Motor planning; RUE: mild-moderate distal tremor with attempts at fine motor movements; RUE movement is  slow, labored; LUE similar to R, but less so  OBSERVATIONS:  Pt noted to be rocking in her chair through most of eval d/t high pain levels.  TREATMENT DATE: 08/16/23 Therapeutic Exercise: -Pt participated in BUE flexibility and strengthening with use of 1.5# dowel.  Intermittent min guard to support each extremity to a comfortable end ROM.  Positioned in supine for 1 set to focus on flexibility, completing chest press, shoulder flexion, horiz abd/add, abd, ER, and in standing performed IR and ext behind back for 10 reps each.  Transitioned to sitting and standing for 2nd set for same, completing 10 reps each.  Min vc for form and technique. - Instructed pt in self passive R wrist extension stretch, and place and hold exercise to promote wrist ext with hand off edge of table; good return demo.  Encouraged completion 3x daily for 1-3 sets of 10 reps.  Self Care: -Issued standard wrist brace (small, R hand) for R wrist drop.  OT advised pt on wearing brace only intermittently during the day to increase functional use of the hand with daily tasks.  Reinforced importance of doffing brace at least 3x daily for stretching wrist into extension and working on place and hold exercises for wrist extension.  Advised pt to doff at night time.  Reviewed care/washing instructions for brace. -Recommendation made to obtain moist heat pack to manage neck/shoulder pain in the home and advised on options to obtain. -Provided recommendation for non-medical grade compression sleeve to manage neuropathic LUE pain.  OT measured arm for size recommendation and advised on options to obtain.  PATIENT EDUCATION: Education details: HEP, wearing schedule for wrist brace Person educated: Patient Education method: Explanation Education comprehension: verbalized understanding  HOME EXERCISE PROGRAM: Cane stretches  for bilat shoulder strengthening and flexibility, passive wrist ext stretching, place and hold for R wrist extension   GOALS: Goals reviewed with patient? Yes  SHORT TERM GOALS: Target date: 09/20/23  Pt will be indep to perform HEP for improving BUE flexibility, strength, and coordination. Baseline: Eval: HEP not yet initiated Goal status: INITIAL  LONG TERM GOALS: Target date: 11/01/23  Pt will increase FOTO score to 57 or better to indicate improvement in self perceived functional use of the R arm with daily tasks. Baseline: Eval: 32 Goal status: INITIAL  2.  Pt will increase L/R grip strength by 10 or more lbs in order to improve ability to hold and carry ADL supplies.    Baseline: Eval: L grip 11 lbs, R grip 0 lbs Goal status: INITIAL  3.  Pt will increase bilat shoulder strength to wash hair independently. Baseline: Eval: daughter washes hair (R shoulder grossly 3-, L shoulder grossly 3+) Goal status: INITIAL  4.  Pt will increase L/R lateral pinch strength to enable pt to manipulate eating and grooming utensils with modified indep. Baseline: Eval: L pinch: 6 lbs, R 0 lbs (pt using L non-dominant hand for above, but with difficulty and frequent dropping of utensils) Goal status: INITIAL  5.  Pt will increase R hand FMC/dexterity skills to enable pt to manage clothing fasteners with modified indep-indep. Baseline: Eval: Unable to manage clothing fasteners (R 9 hole peg test: 51 sec, L 26 sec) Goal status: INITIAL  ASSESSMENT:  CLINICAL IMPRESSION: Pt verbalized that the red foam built up handles issued last session have been very helpful when using her silverware and brushing teeth.  Pt also acknowledged good benefit from compression gloves issued last session, and verbalized interest in looking in to a compression sleeve for the L arm pain.  OT provided recommendation for non-medical grade compression and options to obtain.  Pt also receptive to moist heating pad  recommendations for pain management in neck and shoulders.  Pt with good tolerance to dowel exercises this date, requiring min guard to achieve a comfortable end ROM in above noted shoulder planes.  Issued standard wrist brace to reduce R wrist drop during functional activities, but advised on importance of removing throughout the day to stretch and strengthening R wrist.  Noted good fit and comfort of brace.  Pt verbalized understanding.  Pt will continue to benefit from skilled OT to address BUE weakness, flexibility limitations, and impaired coordination in BUEs, and to provide pain management strategies; all deficits noted above continue to impact pt's independence and efficiency with ADL and IADL tasks.      PERFORMANCE DEFICITS: in functional skills including ADLs, IADLs, coordination, dexterity, sensation, ROM, strength, pain, Fine motor control, Gross motor control, decreased knowledge of precautions, decreased knowledge of use of DME, and UE functional use, and psychosocial skills including coping strategies, environmental adaptation, habits, and routines and behaviors.   IMPAIRMENTS: are limiting patient from ADLs, IADLs, rest and sleep, work, and leisure.   CO-MORBIDITIES: may have co-morbidities  that affects occupational performance. Patient will benefit from skilled OT to address above impairments and improve overall function.  MODIFICATION OR ASSISTANCE TO COMPLETE EVALUATION: No modification of tasks or assist necessary to complete an evaluation.  OT OCCUPATIONAL PROFILE AND HISTORY: Problem focused assessment: Including review of records relating to presenting problem.  CLINICAL DECISION MAKING: Moderate - several treatment options, min-mod task modification necessary  REHAB POTENTIAL: Good  EVALUATION COMPLEXITY: Moderate    PLAN:  OT FREQUENCY: 2x/week  OT DURATION: 12 weeks  PLANNED INTERVENTIONS: 97168 OT Re-evaluation, 97535 self care/ADL training, 02889 therapeutic  exercise, 97530 therapeutic activity, 97112 neuromuscular re-education, 97140 manual therapy, 97010 moist heat, 97010 cryotherapy, 97760 Splinting (initial encounter), passive range of motion, psychosocial skills training, energy conservation, coping strategies training, patient/family education, and DME and/or AE instructions  RECOMMENDED OTHER SERVICES: PT referral in chart  CONSULTED AND AGREED WITH PLAN OF CARE: Patient  PLAN FOR NEXT SESSION: Instructed in theraputty HEP  Inocente Blazing, MS, OTR/L  Inocente MARLA Blazing, OT 08/16/2023, 12:53 PM

## 2023-08-17 ENCOUNTER — Ambulatory Visit: Payer: Managed Care, Other (non HMO)

## 2023-08-18 ENCOUNTER — Ambulatory Visit: Payer: Managed Care, Other (non HMO)

## 2023-08-22 ENCOUNTER — Encounter: Payer: Self-pay | Admitting: Neurosurgery

## 2023-08-24 ENCOUNTER — Telehealth: Payer: Self-pay | Admitting: Neurosurgery

## 2023-08-24 DIAGNOSIS — Z981 Arthrodesis status: Secondary | ICD-10-CM

## 2023-08-24 DIAGNOSIS — G959 Disease of spinal cord, unspecified: Secondary | ICD-10-CM

## 2023-08-24 MED ORDER — PROMETHAZINE HCL 12.5 MG PO TABS: 12.5000 mg | ORAL_TABLET | Freq: Three times a day (TID) | ORAL | 0 refills | Status: AC | PRN

## 2023-08-24 MED ORDER — OXYCODONE HCL 5 MG PO TABS: 5.0000 mg | ORAL_TABLET | Freq: Three times a day (TID) | ORAL | 0 refills | Status: DC | PRN

## 2023-08-24 NOTE — Telephone Encounter (Signed)
 Patient called to request a refill for OXYCODONE  and PROMETHAZINE  to her [facility].  Patient would also like to give an FYI to Dr.Yarbrough that she has been seeing blood in her urine. Went to PCP to have labs done no signs of any UTI. PCP is sending her for a STAT CT scan of her abdomen. Patient believes it could possible be from the amount of gabapentin  she has been taking so she has stopped for now until they can figure something out.

## 2023-08-24 NOTE — Telephone Encounter (Signed)
 DOS: 07/01/23 ACDF C4-C7 for cervical myelopathy   Will refill oxycodone  to take q 8 hours this time. Will plan to go to q 12 hours at next refill.   PMP reviewed and is appropriate.   Refill of phenergan  okay.   Agree with referral to pain management that Dr. Mason Sole placed.   She should continue to follow up with PCP regarding blood in urine.   She should talk to Dr. Mason Sole about stopping the neurontin  (he just increased her dose). Generally, we recommend you taper off of it.

## 2023-08-24 NOTE — Telephone Encounter (Signed)
 Called patient to update her on Stacy's recommendations. She indicates understanding that she needs to follow up with Dr. Mason Sole in regards to tapering off of Gabapentin  rather than stopping cold [place].

## 2023-08-25 ENCOUNTER — Ambulatory Visit: Payer: Managed Care, Other (non HMO)

## 2023-08-26 ENCOUNTER — Ambulatory Visit
Admission: RE | Admit: 2023-08-26 | Discharge: 2023-08-26 | Disposition: A | Discharge status: Home / Self Care | Payer: Managed Care, Other (non HMO) | Source: Ambulatory Visit | Attending: Family Medicine | Admitting: Family Medicine

## 2023-08-26 ENCOUNTER — Other Ambulatory Visit: Payer: Self-pay | Admitting: Family Medicine

## 2023-08-26 DIAGNOSIS — R319 Hematuria, unspecified: Secondary | ICD-10-CM | POA: Insufficient documentation

## 2023-08-26 MED ORDER — IOHEXOL 300 MG/ML  SOLN
80.0000 mL | Freq: Once | INTRAMUSCULAR | Status: AC | PRN
  Administered 2023-08-26: 80 mL via INTRAVENOUS

## 2023-08-29 ENCOUNTER — Ambulatory Visit: Payer: Managed Care, Other (non HMO) | Admitting: Occupational Therapy

## 2023-08-30 ENCOUNTER — Other Ambulatory Visit: Payer: Self-pay

## 2023-08-30 DIAGNOSIS — R310 Gross hematuria: Secondary | ICD-10-CM

## 2023-08-31 ENCOUNTER — Ambulatory Visit: Payer: Managed Care, Other (non HMO)

## 2023-09-02 ENCOUNTER — Ambulatory Visit: Payer: Managed Care, Other (non HMO) | Admitting: Urology

## 2023-09-02 ENCOUNTER — Other Ambulatory Visit
Admission: RE | Admit: 2023-09-02 | Discharge: 2023-09-02 | Disposition: A | Discharge status: Home / Self Care | Payer: Managed Care, Other (non HMO) | Attending: Urology | Admitting: Urology

## 2023-09-02 VITALS — BP 134/83 | HR 78 | Ht 61.0 in | Wt 155.5 lb

## 2023-09-02 DIAGNOSIS — R310 Gross hematuria: Secondary | ICD-10-CM | POA: Insufficient documentation

## 2023-09-02 LAB — URINALYSIS, COMPLETE (UACMP) WITH MICROSCOPIC
Bilirubin Urine: NEGATIVE
Glucose, UA: NEGATIVE mg/dL
Ketones, ur: NEGATIVE mg/dL
Leukocytes,Ua: NEGATIVE
Nitrite: NEGATIVE
Specific Gravity, Urine: >1.03 — ABNORMAL HIGH (ref 1.005–1.030)
pH: 5.5 (ref 5.0–8.0)

## 2023-09-02 NOTE — Patient Instructions (Signed)

## 2023-09-02 NOTE — Progress Notes (Signed)
I,Nicole Horton,acting as a scribe for Nicole Scotland, MD.,have documented all relevant documentation on the behalf of Nicole Scotland, MD,as directed by  Nicole Scotland, MD while in the presence of Nicole Scotland, MD.  09/02/2023 6:15 PM   Nicole Horton 01/17/1971 [id number]  Referring provider: Wilford Corner, PA-C [address] [place],  [place] [address]  Chief Complaint  Patient presents with   Establish Care   Hematuria    HPI: 53 year-old female presents today for further evaluation of gross hematuria.  She had a urinalysis on the 14th of January that showed 182 red blood cells per high-powered field. Otherwise, the urine was unremarkable and no evidence of infection. This was repeated and found to have persistent microscopic blood. A CT scan was ordered by her primary care for workup of this. Unfortunately, it was a CT abdomen pelvis with contrast only without delayed. The only finding was an 8mm right lower pole stone, 1,500 Hounsfield units. This stone was also present on CT scan as a similar size and from April of 2023 with unchanged location. It was also present in 2017, but smaller. She had a cystoscopy in 2017 that was negative for hematuria.  She recently underwent a neck and spine surgery which resulted in some complications.   As far as urinary issues since the procedure, she had to really push when she needed to urinate, similar to when having a bowel movement. Her dose of Gabapentin was increased which turned her urine bright yellow. She reports being able to see the blood in her urine. One morning after wiping following urinating, it was as if she was having a period, and she saw blood in the toilet, a dark red/brown color.   Denies any burning. For the past 3 days she has had lower abdominal pain and side pain.  She is not sure if she had a catheter during the surgery. She visited the ER a couple times following surgery and was given a lot of pain  medication.   She is a tobacco user, about 1 pack every 2 days for over 30 years.  She has a family history (grandparents, mother, brother) of renal failure. She has a personal history of a hysterectomy and still have one ovary.   Results for orders placed or performed during the hospital encounter of 09/02/23  Urinalysis, Complete w Microscopic -  Result Value Ref Range   Color, Urine YELLOW YELLOW   APPearance CLEAR CLEAR   Specific Gravity, Urine >1.030 (H) 1.005 - 1.030   pH 5.5 5.0 - 8.0   Glucose, UA NEGATIVE NEGATIVE mg/dL   Hgb urine dipstick LARGE (A) NEGATIVE   Bilirubin Urine NEGATIVE NEGATIVE   Ketones, ur NEGATIVE NEGATIVE mg/dL   Protein, ur TRACE (A) NEGATIVE mg/dL   Nitrite NEGATIVE NEGATIVE   Leukocytes,Ua NEGATIVE NEGATIVE   Squamous Epithelial / HPF 6-10 0 - 5 /HPF   WBC, UA 0-5 0 - 5 WBC/hpf   RBC / HPF 11-20 0 - 5 RBC/hpf   Bacteria, UA MANY (A) NONE SEEN     PMH: Past Medical History:  Diagnosis Date   Abnormal uterine bleeding 05/15/2014   Adnexal pain 08/13/2015   Anxiety    a.) on BZO PRN (alprazolam)   Asthma    Cervical myelopathy with cervical radiculopathy (HCC)    Cervical spinal stenosis    Complication of anesthesia    a.) early emergence from anesthesia/sedation used for cardiac catheterization   COVID-19  Essential (primary) hypertension 07/31/2015   GERD (gastroesophageal reflux disease)    Granulomatous lung disease (HCC)    History of cardiac catheterization 01/18/2014   a.) LHC 01/18/2014: normal coronaries; myocardial bridge dLAD casuing compression/spasm   History of kidney stones    Hyperlipidemia    Multiple thyroid nodules    a.) cervical MRI 06/08/2023: multiple thyroid nodules with largest mesauring 1.7 cm   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   a.) LHC 01/18/2014: 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   PAF (paroxysmal atrial fibrillation) (HCC)    a.) CHA2DS2-VASc = 2 (sex, HTN) as of 06/30/2023; b.)  cardiac rate/rhythm maintained intrinsically without pharmacological intervention; no chronic OAC   Palpitations (PAC/PVCs)    a.) holter 10/13/2016: rare atrial and ventricular ectopy (< 1.0% study burden)   Pelvic phlebolithiasis    Pneumonia    RLS (restless legs syndrome)    SVT (supraventricular tachycardia) (HCC)    a.) s/p SVT ablation at [facility] on 04/21/2022    Surgical History: Past Surgical History:  Procedure Laterality Date   ABDOMINAL HYSTERECTOMY     ANTERIOR CERVICAL DECOMP/DISCECTOMY FUSION N/A 07/01/2023   Procedure: C4-7 ANTERIOR CERVICAL DISCECTOMY AND FUSION;  Surgeon: Venetia Night, MD;  Location: [facility] ORS;  Service: Neurosurgery;  Laterality: N/A;   BREAST BIOPSY Right 01/23/2021   Bx showed fibroepithelial (fibroadenoma) lesion   COLONOSCOPY     CYSTOSCOPY  08/11/2017   Procedure: CYSTOSCOPY;  Surgeon: Nadara Mustard, MD;  Location: [facility] ORS;  Service: Gynecology;;   ESOPHAGOGASTRODUODENOSCOPY (EGD) WITH ESOPHAGEAL DILATION     EXCISION OF BREAST LESION Right 02/13/2021   Procedure: EXCISION OF BREAST LESION;  Surgeon: Earline Mayotte, MD;  Location: [facility] ORS;  Service: General;  Laterality: Right;   LAPAROSCOPIC HYSTERECTOMY Bilateral 08/11/2017   Procedure: HYSTERECTOMY TOTAL LAPAROSCOPIC BILATERAL SALPINGECTOMY;  Surgeon: Nadara Mustard, MD;  Location: [facility] ORS;  Service: Gynecology;  Laterality: Bilateral;   LEFT HEART CATH AND CORONARY ANGIOGRAPHY N/A 01/18/2014   Procedure: LEFT HEART CATH AND CORONARY ANGIOGRAPHY; Location: [facility]   SVT ABLATION N/A 04/21/2022   Procedure: SVT ABLATION; Location: [facility]; Surgeon: Gerre Pebbles, MD   TUBAL LIGATION      Home Medications:  Allergies as of 09/02/2023       Reactions   Other Nausea Only, Other (See Comments)   Pholcodine   Codeine Nausea And Vomiting   Robaxin [methocarbamol] Nausea And Vomiting        Medication List        Accurate as of September 02, 2023  6:15 PM. If you have any  questions, ask your nurse or doctor.          STOP taking these medications    acetaminophen 500 MG tablet Commonly known as: TYLENOL Stopped by: Nicole Horton       TAKE these medications    albuterol 108 (90 Base) MCG/ACT inhaler Commonly known as: VENTOLIN HFA Inhale 1-2 puffs into the lungs every 6 (six) hours as needed for wheezing or shortness of breath.   ALPRAZolam 0.25 MG tablet Commonly known as: XANAX Take 0.25 mg by mouth 2 (two) times daily as needed for anxiety.   cyclobenzaprine 10 MG tablet Commonly known as: FLEXERIL Take 1 tablet (10 mg total) by mouth 3 (three) times daily as needed for muscle spasms.   gabapentin 400 MG capsule Commonly known as: NEURONTIN Take 400 mg by mouth 2 (two) times daily. What changed: Another medication with the same name was removed. Continue taking  this medication, and follow the directions you see here. Changed by: Nicole Horton   ipratropium-albuterol 0.5-2.5 (3) MG/3ML Soln Commonly known as: DUONEB Take 3 mLs by nebulization every 4 (four) hours as needed (wheezing, shortness of breath or coughing).   oxyCODONE 5 MG immediate release tablet Commonly known as: Roxicodone Take 1 tablet (5 mg total) by mouth every 8 (eight) hours as needed for severe pain (pain score 7-10).   polyethylene glycol 17 g packet Commonly known as: MiraLax Take 17 g by mouth daily.   promethazine 12.5 MG tablet Commonly known as: PHENERGAN Take 1 tablet (12.5 mg total) by mouth every 8 (eight) hours as needed for nausea.        Allergies:  Allergies  Allergen Reactions   Other Nausea Only and Other (See Comments)    Pholcodine   Codeine Nausea And Vomiting   Robaxin [Methocarbamol] Nausea And Vomiting    Family History: Family History  Problem Relation Age of Onset   Diabetes Mother    Heart failure Mother    Breast cancer Mother 13   Brain cancer Maternal Aunt    Ovarian cancer Maternal Grandmother 60   Rectal  cancer Maternal Grandfather    Hematuria Neg Hx     Social History:  reports that she has been smoking cigarettes. She has a 15 pack-year smoking history. She has never used smokeless tobacco. She reports that she does not drink alcohol and does not use drugs.   Physical Exam: BP 134/83   Pulse 78   Ht 5\' 1"  (1.549 m)   Wt 155 lb 8 oz (70.5 kg)   LMP 06/10/2017 (Approximate)   BMI 29.38 kg/m   Constitutional:  Alert and oriented, No acute distress. HEENT: [place] AT, moist mucus membranes.  Trachea midline, no masses. Neurologic: Grossly intact, no focal deficits, moving all 4 extremities. Psychiatric: Normal mood and affect.  Urinalysis    Component Value Date/Time   COLORURINE YELLOW 09/02/2023 0807   APPEARANCEUR CLEAR 09/02/2023 0807   APPEARANCEUR Cloudy (A) 08/19/2015 1452   LABSPEC >1.030 (H) 09/02/2023 0807   LABSPEC 1.025 07/02/2014 1745   PHURINE 5.5 09/02/2023 0807   GLUCOSEU NEGATIVE 09/02/2023 0807   GLUCOSEU Negative 07/02/2014 1745   HGBUR LARGE (A) 09/02/2023 0807   BILIRUBINUR NEGATIVE 09/02/2023 0807   BILIRUBINUR Negative 08/19/2015 1452   BILIRUBINUR Negative 07/02/2014 1745   KETONESUR NEGATIVE 09/02/2023 0807   PROTEINUR TRACE (A) 09/02/2023 0807   NITRITE NEGATIVE 09/02/2023 0807   LEUKOCYTESUR NEGATIVE 09/02/2023 0807   LEUKOCYTESUR Negative 07/02/2014 1745    Lab Results  Component Value Date   LABMICR See below: 08/19/2015   WBCUA 6-10 (A) 08/19/2015   RBCUA 3-10 (A) 08/19/2015   LABEPIT >10 (H) 08/19/2015   MUCUS Present (A) 08/19/2015   BACTERIA MANY (A) 09/02/2023    Pertinent Imaging: Narrative & Impression  CLINICAL DATA:  Hematuria.   EXAM: CT ABDOMEN AND PELVIS WITH CONTRAST   TECHNIQUE: Multidetector CT imaging of the abdomen and pelvis was performed using the standard protocol following bolus administration of intravenous contrast.   RADIATION DOSE REDUCTION: This exam was performed according to the departmental  dose-optimization program which includes automated exposure control, adjustment of the mA and/or kV according to patient size and/or use of iterative reconstruction technique.   CONTRAST:  80mL OMNIPAQUE IOHEXOL 300 MG/ML  SOLN   COMPARISON:  11/10/2021.   FINDINGS: Lower chest: Calcified granuloma, right lung base. Lung bases otherwise clear.   Hepatobiliary: No focal  liver abnormality is seen. No gallstones, gallbladder wall thickening, or biliary dilatation.   Pancreas: Unremarkable. No pancreatic ductal dilatation or surrounding inflammatory changes.   Spleen: Normal in size without focal abnormality.   Adrenals/Urinary Tract: Normal adrenal glands. Kidneys normal in size, orientation and position with symmetric enhancement and excretion. 8 mm stone, lower pole the right kidney. No other intrarenal stones. No masses. No hydronephrosis. Normal ureters. Normal bladder.   Stomach/Bowel: Normal stomach. Small bowel and colon are normal in caliber. No wall thickening. No inflammation. Normal appendix.   Vascular/Lymphatic: Aortic atherosclerosis. No aneurysm. No enlarged lymph nodes.   Reproductive: Status post hysterectomy. No adnexal masses.   Other: No abdominal wall hernia or abnormality. No abdominopelvic ascites.   Musculoskeletal: No fracture or acute finding.  No bone lesion.   IMPRESSION: 1. No acute findings within the abdomen or pelvis. 2. Right kidney lower pole 8 mm nonobstructing stone. No other urinary tract stones. No hydronephrosis. 3. Aortic atherosclerosis.   Aortic Atherosclerosis (ICD10-I70.0).   Electronically Signed   By: Amie Portland M.D.   On: 08/27/2023 14:31  Personally reviewed the above scan and agree with radiologic interpretation. The stone measures 1,500 Hounsfield units.    Assessment & Plan:    1. Gross hematuria  - She's had a recent CT scan, unfortunately it was the wrong protocol. That being said, the risk for upper tract  disease is quite low. She has delays down to the mid ureter.  - History of tobacco use. Recommend cystoscopy for further evaluation. Also a pelvic exam to rule out any external concerns such as a polyp. She is in agreement with this plan and willing to get it scheduled.  I have reviewed the above documentation for accuracy and completeness, and I agree with the above.   Nicole Scotland, MD   [facility] [address], [address] [place], [place] [address] [phone]

## 2023-09-06 ENCOUNTER — Ambulatory Visit (INDEPENDENT_AMBULATORY_CARE_PROVIDER_SITE_OTHER): Payer: Managed Care, Other (non HMO) | Admitting: Urology

## 2023-09-06 VITALS — BP 123/81 | HR 99 | Ht 61.0 in | Wt 157.5 lb

## 2023-09-06 DIAGNOSIS — N200 Calculus of kidney: Secondary | ICD-10-CM

## 2023-09-06 DIAGNOSIS — Z87898 Personal history of other specified conditions: Secondary | ICD-10-CM

## 2023-09-06 DIAGNOSIS — R310 Gross hematuria: Secondary | ICD-10-CM

## 2023-09-06 LAB — URINALYSIS, COMPLETE
Bilirubin, UA: NEGATIVE
Glucose, UA: NEGATIVE
Ketones, UA: NEGATIVE
Leukocytes,UA: NEGATIVE
Nitrite, UA: NEGATIVE
Protein,UA: NEGATIVE
Specific Gravity, UA: 1.025 (ref 1.005–1.030)
Urobilinogen, Ur: 0.2 mg/dL (ref 0.2–1.0)
pH, UA: 5.5 (ref 5.0–7.5)

## 2023-09-06 LAB — MICROSCOPIC EXAMINATION

## 2023-09-06 NOTE — Progress Notes (Signed)
   09/06/23  CC:  Chief Complaint  Patient presents with   Cysto    HPI: 53 year old smoker with personal history of gross hematuria who presents today for cystoscopy.  Please see previous notes for details.  Anxious about the procedure today.  Blood pressure 123/81, pulse 99, height 5\' 1"  (1.549 m), weight 157 lb 8 oz (71.4 kg), last menstrual period 06/10/2017. NED. A&Ox3.   No respiratory distress   Abd soft, NT, ND Normal external genitalia with patent urethral meatus.  Notable periurethral atrophy appreciated.  Speculum exam shows a normal in the vaginal vault with fairly good support.  No bleeding.  Cervix is surgically absent.  Pelvic exam chaperoned by CMA, [facility].  Cystoscopy Procedure Note  Patient identification was confirmed, informed consent was obtained, and patient was prepped using Betadine solution.  Lidocaine jelly was administered per urethral meatus.    Procedure: - Flexible cystoscope introduced, without any difficulty.   - Thorough search of the bladder revealed:    normal urethral meatus    normal urothelium    no stones    no ulcers     no tumors    no urethral polyps    no trabeculation  - Ureteral orifices were normal in position and appearance.  Post-Procedure: - Patient tolerated the procedure well  Assessment/ Plan:  1. Gross hematuria (Primary) Etiology of gross hematuria is unclear  Upper tract imaging is reassuring.  Shared decision making not to pursue additional upper tract imaging to evaluate distal ureters, pathology is unlikely but not be real.  She understands this.  Cystoscopy today was reassuring.  Consider additional imaging if she has another episode of recurrent gross hematuria.  This would be in the form of CT urogram or retrogrades.  2.  Kidney stone Stable, asymptomatic.  Will plan for KUB next year.  May be source of gross hematuria.  - Urinalysis, Complete - Abdomen 1 view (KUB); Future  Follow-up in a  year with a UA/KUB  Vanna Scotland, MD

## 2023-09-19 ENCOUNTER — Other Ambulatory Visit: Payer: Self-pay

## 2023-09-19 DIAGNOSIS — G959 Disease of spinal cord, unspecified: Secondary | ICD-10-CM

## 2023-09-19 NOTE — Progress Notes (Signed)
REFERRING PHYSICIAN:  Wilford Corner, Pa-c [address] [place],  [place] [address]  DOS: 07/01/23  ACDF C4-C7 for cervical myelopathy  HISTORY OF PRESENT ILLNESS:  Ms. Carton a 53 year old presenting for postop follow-up.  She is about 3 months out from surgery and continues to have weakness in her right wrist.  Her proximal upper extremity symptoms have improved significantly and she does not have any significant neck pain.  She describes significant pain in her bilateral thumbs and forearms with dysesthetic pain to light touch in her left forearm and numbness into her first 3 fingers on the left from her elbow distally.  She also has numbness in the pad of her right thumb but no numbness that extends into any of her fingers on the right.  She has been working with PT as her insurance is no longer covered by [facility].  She is also seeing neurology and has been following up with urology regarding hematuria.  Dr. Myer Haff recommended she continue with OT. She had repeat EMG on 08/29/23. She is seeing neurology Sherryll Burger).   08/11/2023 (Dr. Myer Haff) Magdalene Patricia continues to have issues with weakness in her arms.  She also describes an intention tremor.  She reports numbness down her left arm in a C6 distribution.  She continues to have significant neck pain.     07/14/23 (Stacy Luna's note)  [name] is 2 weeks status post above surgery. Given ultram and flexeril on discharge from the hospital.    She called after surgery with increased pain on 07/05/23- she was not taking ultram. She was to pick that up and was given celebrex as well.    She went to [facility] ED on 07/08/23 with complaints of pain. She had CT and MRI of cervical spine- Dr. Myer Haff reviewed these scans. She also had [place] of right UE that was negative. She was given oxycodone from the ED.    She went to [facility] ED on 07/08/23 as well. She was started on neurontin, medrol dose pack, phenergan.    She called  on Tuesday with increased pain. She was increased to neurotnin 300mg  tid and was to continue with her other medications as prescribed.    She is here for her postop visit.    She has pain in her shoulder blades, into her shoulders, and down both arms. She has some numbness and tingling in both arms into her hands. Her preop left arm pain is much better. She has pain in her right arm that is new since the surgery. She notes weakness in right arm as well. No weakness noted in legs.    She has noticed improvement with increasing her neurotin. She is also on flexeril and oxycodone (only taking 2-3 per day). She is still taking medrol dose pack as well.    PHYSICAL EXAMINATION:  NEUROLOGICAL:  General: In no acute distress.   Awake, alert, oriented to person, place, and time.  Pupils equal round and reactive to light.  Facial tone is symmetric.    Strength: Side Biceps Triceps Deltoid Interossei Grip Wrist Ext. Wrist Flex.  R 5 5 5 5 3 3 3   L 5 5 5 5  4+ 5 5    Incision well healed  Normal gait.   Imaging:  Cervical xrays 09/20/23 Without evidence of hardware malfunction   Report for above xrays not yet available.    EMG of right upper extremity dated 08/29/23:  Impression: This is an abnormal electrodiagnostic exam  consistent with an acute on chronic C6 and C7 radiculopathy on the right. Presence of motor unit with recruitment can be a good prognostic sign.   Thank you for the referral of this patient. It was our privilege to participate in care of your patient. Feel free to contact us with any further questions.  _____________________________ Cristopher Peru, M.D.    Assessment / Plan: BLANCHIE ZELEZNIK is doing fair s/p above surgery.  While she continues to have significant pain, particularly dysesthetic pain in her distal upper extremities as well as weakness in her right wrist, her strength is objectively improving.  I recommended that she continue with therapy as tolerated and  follow-up with neurology as recommended. I recommend she continue with her interval post-operative follow ups and return to clinic to see Dr. Myer Haff in 3 months with cervical xrays prior.  Advised to contact the office if any questions or concerns arise.   Manning Charity PA-C Dept of Neurosurgery

## 2023-09-20 ENCOUNTER — Ambulatory Visit (INDEPENDENT_AMBULATORY_CARE_PROVIDER_SITE_OTHER): Payer: Managed Care, Other (non HMO) | Admitting: Neurosurgery

## 2023-09-20 ENCOUNTER — Encounter: Payer: Self-pay | Admitting: Neurosurgery

## 2023-09-20 ENCOUNTER — Ambulatory Visit
Admission: RE | Admit: 2023-09-20 | Discharge: 2023-09-20 | Disposition: A | Discharge status: Home / Self Care | Payer: Managed Care, Other (non HMO) | Attending: Orthopedic Surgery | Admitting: Orthopedic Surgery

## 2023-09-20 ENCOUNTER — Ambulatory Visit
Admission: RE | Admit: 2023-09-20 | Discharge: 2023-09-20 | Disposition: A | Discharge status: Home / Self Care | Payer: Managed Care, Other (non HMO) | Source: Ambulatory Visit | Attending: Orthopedic Surgery | Admitting: Orthopedic Surgery

## 2023-09-20 VITALS — BP 118/78 | Temp 98.2°F | Ht 61.0 in | Wt 158.6 lb

## 2023-09-20 DIAGNOSIS — G959 Disease of spinal cord, unspecified: Secondary | ICD-10-CM | POA: Diagnosis present

## 2023-09-20 DIAGNOSIS — Z981 Arthrodesis status: Secondary | ICD-10-CM

## 2023-09-22 ENCOUNTER — Ambulatory Visit (INDEPENDENT_AMBULATORY_CARE_PROVIDER_SITE_OTHER): Payer: Managed Care, Other (non HMO) | Admitting: Neurosurgery

## 2023-09-22 DIAGNOSIS — G545 Neuralgic amyotrophy: Secondary | ICD-10-CM

## 2023-09-22 NOTE — Progress Notes (Signed)
I spoke with Nicole Horton today to review my discussion regarding her ongoing symptoms, imaging, and EMG results with our peripheral nerve specialist, Dr. Katrinka Blazing. 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  I relayed this to the patient.  I encouraged her to continue with therapy and we will continue to give her nerves time to recover.  Should she continue to have a lot of pain despite watchful waiting, we did briefly discuss the possibility of a cervical spinal cord stimulator.  She will consider this in the future. Commended that she keep her interval follow-up in regards to her recent cervical surgery and encouraged her to call us in the interim should she have any questions or concerns.  She expressed understanding and was in agreement with this plan.

## 2023-10-12 ENCOUNTER — Other Ambulatory Visit: Payer: Managed Care, Other (non HMO) | Admitting: Urology

## 2023-11-07 ENCOUNTER — Telehealth: Payer: Self-pay | Admitting: Neurosurgery

## 2023-11-07 NOTE — Telephone Encounter (Signed)
 Patient is calling to request a call back from [facility] regarding her Gabapentin. She states that she has begun to reduce the dosage she is taking so that she can try to work towards returning to work. She was taking 1,400mg  and is now down to 300mg  but states that the neuropathy pain is really bothering her and she has not been able to sleep in two weeks. Please advise.

## 2023-11-14 ENCOUNTER — Other Ambulatory Visit: Payer: Self-pay | Admitting: Family Medicine

## 2023-11-14 DIAGNOSIS — Z1231 Encounter for screening mammogram for malignant neoplasm of breast: Secondary | ICD-10-CM

## 2023-11-15 NOTE — Telephone Encounter (Signed)
 Noted.

## 2023-12-12 ENCOUNTER — Encounter

## 2023-12-19 ENCOUNTER — Other Ambulatory Visit: Payer: Self-pay

## 2023-12-19 DIAGNOSIS — G959 Disease of spinal cord, unspecified: Secondary | ICD-10-CM

## 2023-12-20 ENCOUNTER — Ambulatory Visit
Admission: RE | Admit: 2023-12-20 | Discharge: 2023-12-20 | Disposition: A | Discharge status: Home / Self Care | Attending: Neurosurgery | Admitting: Neurosurgery

## 2023-12-20 ENCOUNTER — Ambulatory Visit (INDEPENDENT_AMBULATORY_CARE_PROVIDER_SITE_OTHER): Payer: Managed Care, Other (non HMO) | Admitting: Neurosurgery

## 2023-12-20 ENCOUNTER — Encounter: Payer: Self-pay | Admitting: Neurosurgery

## 2023-12-20 ENCOUNTER — Ambulatory Visit
Admission: RE | Admit: 2023-12-20 | Discharge: 2023-12-20 | Disposition: A | Discharge status: Home / Self Care | Source: Ambulatory Visit | Attending: Neurosurgery | Admitting: Neurosurgery

## 2023-12-20 VITALS — BP 136/84 | Ht 61.0 in | Wt 158.0 lb

## 2023-12-20 DIAGNOSIS — G545 Neuralgic amyotrophy: Secondary | ICD-10-CM

## 2023-12-20 DIAGNOSIS — G959 Disease of spinal cord, unspecified: Secondary | ICD-10-CM

## 2023-12-20 DIAGNOSIS — R208 Other disturbances of skin sensation: Secondary | ICD-10-CM

## 2023-12-20 NOTE — Progress Notes (Signed)
 REFERRING PHYSICIAN:  Lenell Query, Pa-c [address] [place],  [place] [address]  DOS: 07/01/23  ACDF C4-C7 for cervical myelopathy  HISTORY OF PRESENT ILLNESS: 12/20/2023 Nicole Horton has had some improvements.  She still has significant hypersensitivity in her right arm.  She still has weakness, though her strength has improved.  She is gone back to work.  She still having some issues with dropping things.  She has weaned herself off of medications.  She did try gabapentin  but had difficulty with oversedation.   08/11/2023 Nicole Horton continues to have issues with weakness in her arms.  She also describes an intention tremor.  She reports numbness down her left arm in a C6 distribution.  She continues to have significant neck pain.   07/14/23 (Nicole Horton's note)  Nicole Horton is 2 weeks status post above surgery. Given ultram  and flexeril  on discharge from the hospital.   She called after surgery with increased pain on 07/05/23- she was not taking ultram . She was to pick that up and was given celebrex  as well.   She went to [facility] ED on 07/08/23 with complaints of pain. She had CT and MRI of cervical spine- Dr. Mont Antis reviewed these scans. She also had US  of right UE that was negative. She was given oxycodone  from the ED.   She went to [facility] ED on 07/08/23 as well. She was started on neurontin , medrol  dose pack, phenergan .   She called on Tuesday with increased pain. She was increased to neurotnin 300mg  tid and was to continue with her other medications as prescribed.   She is here for her postop visit.   She has pain in her shoulder blades, into her shoulders, and down both arms. She has some numbness and tingling in both arms into her hands. Her preop left arm pain is much better. She has pain in her right arm that is new since the surgery. She notes weakness in right arm as well. No weakness noted in legs.   She has noticed improvement with increasing her  neurotin. She is also on flexeril  and oxycodone  (only taking 2-3 per day). She is still taking medrol  dose pack as well.    PHYSICAL EXAMINATION:  NEUROLOGICAL:  General: In no acute distress.   Awake, alert, oriented to person, place, and time.  Pupils equal round and reactive to light.  Facial tone is symmetric.    Strength: Side Biceps Triceps Deltoid Interossei Grip Wrist Ext. Wrist Flex.  R 4 4 5 5  4- 3 4-  L 5 5 5 5 5 5 5    Incision c/d/I  Normal gait.   Imaging:  Cervical xrays dated 07/09/23:  No complication noted.   Xrays 12/20/23 No significant change  Assessment / Plan: Nicole Horton is doing better s/p above surgery.   She has made some improvements in strength, but still has hypersensitivity.  She will start back with physical therapy next month.  I would like to see her back in 3 to 4 months.  If she is not where she would like to be and her hypersensitivity is not improved, we will consider cervical spinal cord stimulator.  She is seeing pain management tomorrow.  I encouraged her to pursue this as she may have some options for additional neuropathic pain medications.  I spent a total of 15 minutes in this patient's care today. This time was spent reviewing pertinent records including imaging studies, obtaining and confirming history, performing a directed  evaluation, formulating and discussing my recommendations, and documenting the visit within the medical record.   Jodeen Munch MD Dept of Neurosurgery

## 2024-01-03 ENCOUNTER — Encounter: Payer: Self-pay | Admitting: Gastroenterology

## 2024-01-12 ENCOUNTER — Encounter: Payer: Self-pay | Admitting: Gastroenterology

## 2024-01-20 NOTE — OR Nursing (Signed)
 Pt says appt is supposed to be r/s into Sept.

## 2024-01-27 ENCOUNTER — Ambulatory Visit: Admission: RE | Admit: 2024-01-27 | Source: Ambulatory Visit | Admitting: Gastroenterology

## 2024-01-27 ENCOUNTER — Encounter: Admission: RE | Payer: Self-pay | Source: Ambulatory Visit

## 2024-01-27 HISTORY — DX: Hematuria, unspecified: R31.9

## 2024-01-27 HISTORY — DX: Proteinuria, unspecified: R80.9

## 2024-01-27 SURGERY — COLONOSCOPY
Anesthesia: General

## 2024-02-17 ENCOUNTER — Other Ambulatory Visit
Admission: RE | Admit: 2024-02-17 | Discharge: 2024-02-17 | Disposition: A | Discharge status: Home / Self Care | Source: Ambulatory Visit | Attending: Physician Assistant | Admitting: Physician Assistant

## 2024-02-17 DIAGNOSIS — T17908A Unspecified foreign body in respiratory tract, part unspecified causing other injury, initial encounter: Secondary | ICD-10-CM | POA: Insufficient documentation

## 2024-02-17 DIAGNOSIS — R0602 Shortness of breath: Secondary | ICD-10-CM | POA: Diagnosis present

## 2024-02-17 LAB — D-DIMER, QUANTITATIVE: D-Dimer, Quant: <0.27 ug{FEU}/mL (ref 0.00–0.50)

## 2024-03-22 ENCOUNTER — Encounter: Payer: Self-pay | Admitting: Neurosurgery

## 2024-03-22 ENCOUNTER — Ambulatory Visit (INDEPENDENT_AMBULATORY_CARE_PROVIDER_SITE_OTHER): Admitting: Neurosurgery

## 2024-03-22 VITALS — BP 128/82 | Ht 61.0 in | Wt 158.0 lb

## 2024-03-22 DIAGNOSIS — Z09 Encounter for follow-up examination after completed treatment for conditions other than malignant neoplasm: Secondary | ICD-10-CM

## 2024-03-22 DIAGNOSIS — G545 Neuralgic amyotrophy: Secondary | ICD-10-CM

## 2024-03-22 DIAGNOSIS — R208 Other disturbances of skin sensation: Secondary | ICD-10-CM | POA: Diagnosis not present

## 2024-03-22 DIAGNOSIS — Z981 Arthrodesis status: Secondary | ICD-10-CM

## 2024-03-22 DIAGNOSIS — G959 Disease of spinal cord, unspecified: Secondary | ICD-10-CM

## 2024-03-22 NOTE — Progress Notes (Signed)
 REFERRING PHYSICIAN:  Cyrus Selinda Moose, Pa-c [address] [place],  [place] [address]  DOS: 07/01/23  ACDF C4-C7 for cervical myelopathy  HISTORY OF PRESENT ILLNESS: 03/22/2024 Nicole Horton has been improving.  She is still having some weakness.  12/20/2023 Nicole Horton has had some improvements.  She still has significant hypersensitivity in her right arm.  She still has weakness, though her strength has improved.  She is gone back to work.  She still having some issues with dropping things.  She has weaned herself off of medications.  She did try gabapentin but had difficulty with oversedation.   08/11/2023 Nicole Horton continues to have issues with weakness in her arms.  She also describes an intention tremor.  She reports numbness down her left arm in a C6 distribution.  She continues to have significant neck pain.   07/14/23 (Stacy Luna's note)  Nicole Horton is 2 weeks status post above surgery. Given ultram and flexeril on discharge from the hospital.   She called after surgery with increased pain on 07/05/23- she was not taking ultram. She was to pick that up and was given celebrex as well.   She went to [facility] ED on 07/08/23 with complaints of pain. She had CT and MRI of cervical spine- Dr. Clois reviewed these scans. She also had US  of right UE that was negative. She was given oxycodone from the ED.   She went to [facility] ED on 07/08/23 as well. She was started on neurontin, medrol dose pack, phenergan.   She called on Tuesday with increased pain. She was increased to neurotnin 300mg  tid and was to continue with her other medications as prescribed.   She is here for her postop visit.   She has pain in her shoulder blades, into her shoulders, and down both arms. She has some numbness and tingling in both arms into her hands. Her preop left arm pain is much better. She has pain in her right arm that is new since the surgery. She notes weakness in right arm as  well. No weakness noted in legs.   She has noticed improvement with increasing her neurotin. She is also on flexeril and oxycodone (only taking 2-3 per day). She is still taking medrol dose pack as well.    PHYSICAL EXAMINATION:  NEUROLOGICAL:  General: In no acute distress.   Awake, alert, oriented to person, place, and time.  Pupils equal round and reactive to light.  Facial tone is symmetric.    Strength: Side Biceps Triceps Deltoid Interossei Grip Wrist Ext. Wrist Flex.  R 4+ 4+ 5 5 4  4- 4-  L 5 5 5 5 5 5 5    Incision c/d/I  Normal gait.   Imaging:  Cervical xrays dated 07/09/23:  No complication noted.   Xrays 12/20/23 No significant change  Assessment / Plan: Nicole Horton is doing better s/p above surgery.  However, she still does have some symptoms.  She has made some improvements in strength, but still has hypersensitivity.  She is working with pain management but currently off medications.  I will see her back in 6 months with x-rays.  I expect that she will continue to show improvements.   I spent a total of 10 minutes in this patient's care today. This time was spent reviewing pertinent records including imaging studies, obtaining and confirming history, performing a directed evaluation, formulating and discussing my recommendations, and documenting the visit within the medical record.   Reeves Clois  MD Dept of Neurosurgery

## 2024-04-19 ENCOUNTER — Other Ambulatory Visit: Payer: Self-pay | Admitting: Otolaryngology

## 2024-04-19 DIAGNOSIS — E042 Nontoxic multinodular goiter: Secondary | ICD-10-CM

## 2024-04-27 ENCOUNTER — Ambulatory Visit
Admission: RE | Admit: 2024-04-27 | Discharge: 2024-04-27 | Disposition: A | Discharge status: Home / Self Care | Source: Ambulatory Visit | Attending: Otolaryngology | Admitting: Otolaryngology

## 2024-04-27 DIAGNOSIS — E042 Nontoxic multinodular goiter: Secondary | ICD-10-CM | POA: Diagnosis present

## 2024-05-02 ENCOUNTER — Other Ambulatory Visit: Payer: Self-pay | Admitting: Otolaryngology

## 2024-05-02 DIAGNOSIS — E041 Nontoxic single thyroid nodule: Secondary | ICD-10-CM

## 2024-05-08 ENCOUNTER — Ambulatory Visit: Attending: Neurology

## 2024-05-10 ENCOUNTER — Ambulatory Visit: Admitting: Occupational Therapy

## 2024-05-10 NOTE — Progress Notes (Signed)
 Patient for US  guided FNA RT Mid Thyroid  Nodule Biopsy on Friday 05/11/24, I called and spoke with the patient on the phone and gave pre-procedure instructions. Pt was made aware to be here at 2p and check in at the [facility] registration desk.  Pt stated understanding. Called 05/10/24

## 2024-05-11 ENCOUNTER — Ambulatory Visit
Admission: RE | Admit: 2024-05-11 | Discharge: 2024-05-11 | Disposition: A | Discharge status: Home / Self Care | Source: Ambulatory Visit | Attending: Otolaryngology | Admitting: Otolaryngology

## 2024-05-11 DIAGNOSIS — E041 Nontoxic single thyroid nodule: Secondary | ICD-10-CM | POA: Insufficient documentation

## 2024-05-11 MED ORDER — LIDOCAINE HCL (PF) 1 % IJ SOLN
10.0000 mL | Freq: Once | INTRAMUSCULAR | Status: AC
  Administered 2024-05-11: 10 mL via INTRADERMAL
  Filled 2024-05-11: qty 10

## 2024-05-14 LAB — CYTOLOGY - NON PAP

## 2024-05-15 ENCOUNTER — Ambulatory Visit: Admitting: Occupational Therapy

## 2024-05-17 ENCOUNTER — Encounter: Admitting: Occupational Therapy

## 2024-05-22 ENCOUNTER — Encounter: Admitting: Occupational Therapy

## 2024-05-24 ENCOUNTER — Encounter: Admitting: Occupational Therapy

## 2024-05-29 ENCOUNTER — Encounter: Admitting: Occupational Therapy

## 2024-05-31 ENCOUNTER — Encounter: Admitting: Occupational Therapy

## 2024-06-05 ENCOUNTER — Encounter: Admitting: Occupational Therapy

## 2024-06-07 ENCOUNTER — Encounter: Admitting: Occupational Therapy

## 2024-06-12 ENCOUNTER — Encounter: Admitting: Occupational Therapy

## 2024-06-14 ENCOUNTER — Encounter: Admitting: Occupational Therapy

## 2024-06-19 ENCOUNTER — Encounter: Admitting: Occupational Therapy

## 2024-06-21 ENCOUNTER — Encounter: Admitting: Occupational Therapy

## 2024-06-26 ENCOUNTER — Encounter: Admitting: Occupational Therapy

## 2024-06-28 ENCOUNTER — Encounter: Admitting: Occupational Therapy

## 2024-07-12 NOTE — Progress Notes (Signed)
 " [facility] - OBSTETRICS AND GYNECOLOGY   Chief Complaint  Patient presents with   Ovarian Cyst   Prior to the starting the appointment, the patient agreed to the use of AI technology and was provided the opportunity to ask questions about this technology.    History of Present Illness Nicole Horton is a 53 year old female who presents with cyclical abdominal pain post-hysterectomy.  She experiences severe abdominal pain monthly, coinciding with her daughters' menstrual cycles. The pain is intense, causing her to bend over, cry, and sometimes miss work. It typically lasts for two to three days, with the first day being the most severe. The pain is located across her lower abdomen, above the hairline, and does not radiate to one side more than the other.  She underwent a hysterectomy approximately eight or nine years ago due to severe menstrual pain but thought that she retained one ovary to avoid hormone replacement therapy (review of operative report and pathology report does not indicate an ovary was removed. Additionally, a CT scan from 2023 (after the hysterectomy) indicates a normal ovary on each side). Despite the hysterectomy, she has been experiencing these symptoms for about ten to eleven months. She is scheduled for a colonoscopy in 2026. She has been prescribed dicyclomine for the pain, which takes two to two and a half hours to provide relief. She has also tried ibuprofen, which now causes stomach discomfort, and Tylenol , which is ineffective. No significant relief has been found from these medications.  Her past medical history includes neck and back surgery in November 2024, after which she experienced hematuria for 75 days, though the cause was not identified. She also underwent an ablation for atrial fibrillation, which resolved her palpitations. She smokes.  Imaging studies include an ultrasound in 04/19/24showing a small cyst, and a CT scan in January 2025 that did  not show a cyst. Previous imaging has shown both ovaries present, though there has been some confusion regarding the presence of the right ovary.  She reports experiencing hot flashes, suggesting possible menopausal symptoms.  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  No other significant CAD.   EGD     TUBAL LIGATION      Gynecologic History: Patient's last menstrual period was 06/09/2017 (approximate).  Obstetric History: [id number]  Family History  Problem Relation Name Age of Onset   Heart failure Mother     Cardiomyopathy (Abnormal function of the heart muscle) Mother         NICM   Diabetes Mother     Breast cancer Mother  44   Diabetes Father  Colon cancer Father  11   Cardiomyopathy (Abnormal function of the heart muscle) Brother         NICM   Cirrhosis Brother     Brain cancer Maternal Aunt     Cardiomyopathy (Abnormal function of the heart muscle) Maternal Uncle     Coronary Artery Disease (Blocked arteries around heart) Maternal Grandmother     Ovarian cancer Maternal Grandmother  57   Rectal cancer Maternal Grandfather     Anesthesia problems Neg Hx     Malignant hypertension Neg Hx     Malignant  hyperthermia Neg Hx     Pseudochol deficiency Neg Hx     PONV Neg Hx      Social History   Socioeconomic History   Marital status: Single  Occupational History   Occupation: CMA  Tobacco Use   Smoking status: Every Day    Current packs/day: 0.50    Average packs/day: 0.5 packs/day for 30.0 years (15.0 ttl pk-yrs)    Types: Cigarettes    Passive exposure: Current   Smokeless tobacco: Never   Tobacco comments:    has decreased to half a pack a day now and doesn't smoke for 9 hours at work  Vaping Use   Vaping status: Never Used  Substance and Sexual Activity   Alcohol use: No   Drug use: Not Currently   Sexual activity: Yes    Partners: Male    Birth control/protection: Surgical    Comment: hysterectomy 08/2017   Social Drivers of Health   Financial Resource Strain: Low Risk  (05/16/2023)   Overall Financial Resource Strain (CARDIA)    Difficulty of Paying Living Expenses: Not hard at all  Food Insecurity: No Food Insecurity (07/01/2023)   Received from [facility]   Hunger Vital Sign    Within the past 12 months, you worried that your food would run out before you got the money to buy more.: Never true    Within the past 12 months, the food you bought just didn't last and you didn't have money to get more.: Never true  Transportation Needs: No Transportation Needs (07/01/2023)   Received from [facility] - [facility]    Lack of Transportation (Medical): No    Lack of Transportation (Non-Medical): No  Housing Stability: Low Risk  (09/07/2023)   Housing Stability Vital Sign    Unable to Pay for Housing in the Last Year: No    Number of Times Moved in the Last Year: 0    Homeless in the Last Year: No    Allergies  Allergen Reactions   Codeine Vomiting   Other Nausea    Phlocodine   Methocarbamol Nausea And Vomiting    Prior to Admission medications  Medication Sig Taking? Last Dose  acetaminophen  (TYLENOL ) 500 MG tablet Take  1,000 mg by mouth every 6 (six) hours as needed Yes Taking  dicyclomine (BENTYL) 20 mg tablet Take 10 mg by mouth every 6 (six) hours Yes Taking  omeprazole (PRILOSEC) 40 MG DR capsule Take 1 capsule (40 mg total) by mouth once daily Yes Taking  norethindrone (AYGESTIN) 5 mg tablet Take 1 tablet (5 mg total) by mouth once daily      Review of Systems  Gastrointestinal:  Positive for abdominal pain.       See HPI     Physical Exam BP (!) 85/60   Pulse 66   Ht 152.4 cm (5')   Wt 71.8 kg (158 lb 3.7  oz)   LMP 06/09/2017 (Approximate)   BMI 30.90 kg/m  Patient's last menstrual period was 06/09/2017 (approximate). Physical Exam Constitutional:      Appearance: Normal appearance.  HENT:     Head: Normocephalic and atraumatic.  Eyes:     General: No scleral icterus.    Conjunctiva/sclera: Conjunctivae normal.  Pulmonary:     Effort: Pulmonary effort is normal. No respiratory distress.  Musculoskeletal:        General: No swelling. Normal range of motion.  Neurological:     General: No focal deficit present.     Mental Status: She is alert and oriented to person, place, and time.     Cranial Nerves: No cranial nerve deficit.  Skin:    General: Skin is warm and dry.     Findings: No lesion.  Psychiatric:        Mood and Affect: Mood normal.        Behavior: Behavior normal.        Judgment: Judgment normal.     Assessment: 53 y.o. H5E5995 female here for  1. Chronic pelvic pain in female   2. Climacteric      Plan: Problem List Items Addressed This Visit   None Visit Diagnoses       Chronic pelvic pain in female    -  Primary   Relevant Medications   norethindrone (AYGESTIN) 5 mg tablet     Climacteric          Assessment & Plan Cyclic pelvic pain post-hysterectomy Cyclic pelvic pain occurring monthly, consistent with menstrual cycle timing, despite hysterectomy. Pain is severe, causing significant distress and impacting daily activities. Previous imaging  (ultrasound and CT) showed normal ovaries, with no evidence of cysts or adnexal masses. Differential diagnosis includes hormonal changes, possible ovulation pain, or endometriosis. Pain is not relieved by dicyclomine or ibuprofen, and Tylenol  is ineffective. Consideration of hormonal manipulation to suppress ovulation and alleviate pain. Discussed potential use of norethindrone (progesterone) to suppress ovulation and alleviate pain. Risks include potential side effects and need for daily medication. Benefits include potential relief of cyclic pain. Shared decision-making involved considering the impact of pain on quality of life and the potential for hormonal treatment to alleviate symptoms. - Prescribed norethindrone (progesterone) to suppress ovulation and alleviate pain. - Instructed to discuss with cardiologist Dr. Florencio regarding the safety of Nordecavi given myocardial bridge. - Scheduled follow-up in 2-3 months to assess effectiveness of treatment.  Menopausal transition Symptoms suggestive of menopausal transition, including hot flashes. Blood work previously indicated menopausal status. Current symptoms may be related to hormonal changes associated with menopause. Discussed the possibility of hormonal treatment to manage symptoms and alleviate cyclic pelvic pain. - Continue to monitor menopausal symptoms and assess response to hormonal treatment for pelvic pain. This note has been created using automated tools and reviewed for accuracy by STEPHEN DANIEL JACKSON.   Return in about 3 months (around 10/10/2024) for Medication follow-up.    Attestation Statement:   I personally performed the service. (TP)  STEPHEN TORIBIO MACE, MD  [facility] OB/GYN [facility] 07/12/2024 3:25 PM    "

## 2024-07-24 NOTE — Progress Notes (Signed)
 [facility]  Return Patient Visit   Consulting / Referring Physician: Self No address on file  Primary Physician: Cyrus Selinda Moose, PA  CHIEF COMPLAINT or REASON FOR VISIT:    Chief Complaint  Patient presents with   Elbow Pain    both   Arm Pain    both   Hip Pain    right   Leg Pain    Thigh(right)     HISTORY OF PRESENTING ILLNESS   Ms. Jacqulyn Barresi is 53 y.o. female is seen in consultation at the request of Cyrus Selinda Moose, PA for evaluation of Elbow Pain (both), Arm Pain (both), Hip Pain (right), and Leg Pain (Thigh(right)) .  The available medical record is reviewed (along with outside notes when available), the patient is interviewed and examined with the plan formulated by myself.      Today Ms. Peine reports pain as noted below.   Last Pain Management MD visit: 03/19/24 Dr. Maryalice  Pertinent PMH:  a-fib, anxiety, GERD, hypertension, obesity, SVT, hyperlipidemia, asthma, smoking history.   Surgical History ACDF C4-7 History of Present Illness Lulubelle Byrnes is a 53 year old female with a history of neck surgery and neuropathy who presents for a follow-up regarding pain management and medication regimen.  She underwent neck surgery in November 2024, with fusion from C4 to C7, due to severe compression that resulted in immobility of her left arm and weakness in her right arm. Post-surgery, she completed 19 rounds of physical therapy and 22 rounds of occupational therapy. Her recovery period has been extended to two to three years, and she was out of work for seven months, longer than the initially expected eight weeks. Upon returning to work, she has not resumed full duties due to cognitive difficulties, including issues with concentration and memory. She experiences neuropathy that has recently moved to her lower back, specifically above her buttock and in her hip, without any known cause. She uses 'pool noodles' to assist with daily  activities like brushing her teeth and eating due to ongoing weakness and coordination issues in her hands. Her left hand still curls involuntarily when holding objects.  She was initially on 2800 mg of gabapentin  daily, which she self-weaned off four months before returning to work due to cognitive side effects.   At her initial consultation, she was prescribed Pregabalin 25 mg nightly but she discontinued the medication due to nausea and increased anxiety symptoms. She was also prescribed Duloxetine, however she reports she does not want to take any anti-depressant medications for her pain management.  Additionally, she was prescribed Robaxin, which was ineffective.    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   Pain Medications       Disp Refills Start End   acetaminophen  (TYLENOL ) 500 MG tablet -- --  --   Sig - Route: Take 1,000 mg by mouth every 6 (six) hours as needed - Oral   Class: Historical Med            * No data to display          PROMIS 29 Physical Function   Anxiety Assessment   Depression Assessment   Fatigue Assessment   Sleep Disturbance   Social Activities   Pain Interference   Pain Intensity    Opioid Risk Tool         PCS         Most Recent  Urine Drug Screen: No results found for: URINECC, 11NOR9CARB, ALPRAZOLAMQL, AHYDROXYALQL, AMOBARBITAQL, AMPHETAMINQL, NORBUPRENOQL, BUTABARBITQL, CARISOPRODQL, CHLORDIAZEQL, CLONAZEPAMQL, 7AMINOCLOQL, COCAINEQL, BENZOYLECGQL, COCAETHYLEQL, CODEINEQL, DIAZEPAMQL, NORDIAZEPAQL, KETAMINE , NORKETAMINQL No results found for: DEHYDRONORQL, FENTANYLQL, NORFENTANYQL, FLUNITRAZEQL, 7AMINOFLUQL, FLURAZEPAMQL, 2HYDROXYEQL, DESALKYLFLQL, HYDROCODONQL, DIHYDROCODQL, NORHYDROCOQL, LORAZEPAMQL No results found for: MDAQL, MDEAQL, MDMAQL, MEPERIDINEQL, NORMEPERIDQL,  METHADONEQL, EDDPQL, METHAMPHETQL, MIDAZOLAMQL, AHDROXYMIDQL, MORPHINEQL  No results found for: 6ACETYLMORQL, NALOXONEQL, NALTREXONEQL, NITRAZEPAMQL, OXAZEPAMQL, OXYCODONEQL, NOROXYCODOQL, OXYMORPHONQL No results found for: PHENOBARBIQL, PHENTERMINQL, PROPOXYPHEQL, NORPROPOXYQL, SECOBARBITQL, TAPENTADOLQL, TAPENTADOLQT, TEMAZEPAMQL, TRAMADOLQL, NDESTHYLTRQL, ODESMETHYQL, TRIAZOLAMQL, AHYDROXYTRQL, ZOPICLONEQL  Illicit Results x6: THC No results found for: 11NOR9CARB    Cocaine No results found for: COCAINEQL    No results found for: BENZOYLECGQL, COCAETHYLEQL   REVIEW OF SYSTEMS   Review of Systems 08/05/2024 Review of systems was entered as a separate nursing note under this encounter.  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  No other significant CAD.   EGD     TUBAL LIGATION     Family History  Problem Relation Age of Onset   Heart failure Mother    Cardiomyopathy (Abnormal function of the heart muscle) Mother        NICM   Diabetes Mother    Breast cancer Mother 66   Diabetes Father    Colon cancer Father 57    Cardiomyopathy (Abnormal function of the heart muscle) Brother        NICM   Cirrhosis Brother    Brain cancer Maternal Aunt    Cardiomyopathy (Abnormal function of the heart muscle) Maternal Uncle    Coronary Artery Disease (Blocked arteries around heart) Maternal Grandmother    Ovarian cancer Maternal Grandmother 57   Rectal cancer Maternal Grandfather    Anesthesia problems Neg Hx    Malignant hypertension Neg Hx    Malignant hyperthermia Neg Hx    Pseudochol deficiency Neg Hx    PONV Neg Hx    Social History   Socioeconomic History   Marital status: Single  Occupational History   Occupation: CMA  Tobacco Use   Smoking status: Every Day    Current packs/day: 0.50    Average packs/day: 0.5 packs/day for 30.0 years (15.0 ttl pk-yrs)    Types: Cigarettes    Passive exposure: Current   Smokeless tobacco: Never   Tobacco comments:    has decreased to half a pack a day now and doesn't smoke for 9 hours at work  Vaping Use   Vaping status: Never Used  Substance and Sexual Activity   Alcohol use: No   Drug use: Not Currently   Sexual activity: Yes    Partners: Male  Birth control/protection: Surgical    Comment: hysterectomy 08/2017   Social Drivers of Health   Financial Resource Strain: Low Risk  (05/16/2023)   Overall Financial Resource Strain (CARDIA)    Difficulty of Paying Living Expenses: Not hard at all  Food Insecurity: No Food Insecurity (07/01/2023)   [facility]   Hunger Vital Sign    Within the past 12 months, you worried that your food would run out before you got the money to buy more.: Never true    Within the past 12 months, the food you bought just didn't last and you didn't have money to get more.: Never true  Transportation Needs: No Transportation Needs (07/01/2023)   Received from [facility] - Transportation    Lack of Transportation (Medical): No    Lack of Transportation (Non-Medical): No   Housing Stability: Low Risk  (09/07/2023)   Housing Stability Vital Sign    Unable to Pay for Housing in the Last Year: No    Number of Times Moved in the Last Year: 0    Homeless in the Last Year: No     Medications:  Current Outpatient Medications  Medication Sig Dispense Refill   acetaminophen  (TYLENOL ) 500 MG tablet Take 1,000 mg by mouth every 6 (six) hours as needed     cyclobenzaprine  (FLEXERIL ) 10 MG tablet Take 10 mg by mouth 3 (three) times daily as needed for Muscle spasms     omeprazole (PRILOSEC) 40 MG DR capsule Take 1 capsule (40 mg total) by mouth once daily 90 capsule 3   promethazine  (PHENERGAN ) 12.5 MG tablet Take 12.5 mg by mouth every 6 (six) hours as needed for Nausea     dicyclomine (BENTYL) 20 mg tablet Take 10 mg by mouth every 6 (six) hours (Patient not taking: Reported on 08/03/2024)     norethindrone (AYGESTIN) 5 mg tablet Take 1 tablet (5 mg total) by mouth once daily (Patient not taking: Reported on 08/03/2024) 90 tablet 0   No current facility-administered medications for this visit.     Physical Exam:   VS:   Vitals:   08/03/24 1252  BP: 126/60  Pulse: 98  Resp: 18  SpO2: 96%  Weight: 71.2 kg (157 lb)  Height: 152.4 cm (5')  PainSc:   6  PainLoc: Thigh     CONSTITUTIONAL:  Well-groomed and well dressed.  No Acute Distress   PSYCHIATRIC:  Alert and oriented to person, place, time, and situation with congruent mood and normal affect.  Pleasant and Cooperative.  Speaks with normal tone and inflection.  Thought processes are linear and goal-directed  GAIT AND STANCE:  Ambulates with normal gait.  Uses no assistive devices.  Normal sitting posture and normal standing posture  SPINE: Cervical:  Normal Range of Motion.  Normal Alignment.  Non-tender Thoracic:  Normal Range of Motion.  Normal Alignment.  Non-tender Lumbar:  Normal Range of Motion.  Normal Alignment.  Non-tender   MUSCULOSKELETAL:  Normal muscle tone and bulk.  No  deformities.  Weakness in the right upper extremities. Severe Parethesis  NEUROLOGIC:  Strength grossly intact to within functional levels.  No apparent sensory deficits  HEENT:  Normocephalic, atraumatic scalp.  Non-injected sclera . Extraocular movements appear full and intact.  Normal pupils  NECK:  Refer to Cervical Spine Exam section   CARDIOVASCULAR:  Appears well-perfused  PULMONARY:  No Dyspnea.  Adequate respiration  ABDOMEN:  Non-distended  INTEGUMENTARY:  No Visible lesions or rash  Imaging Studies:  Per our records: No results found for this or any previous visit.  Has recent imaging for the area being treated been preformed within the last 2 years, yes.  Impression & Plan:  IMPRESSION: Ms. Tarnisha Starzyk is a 53 y.o. 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     1. Cervical radiculopathy   2. Chronic pain syndrome     PLAN: Our recommendation for the management of pain is a multimodal approach that may involve medications, interventions, complementary therapies such as Physical Therapy, Cognitive Behavioral therapy and referral to other appropriate services as indicated.  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 Flexeril   effective but causes grogginess. Gabapentin  and Lyrica discontinued due to adverse effects. Opioid use contraindicated with benzodiazepines.   Discussed that [facility] will not be taking over or providing any new prescriptions. The medication recommendations provided at this visit are at the request of the referring provider. All prescription recommendations will be deferred to the referring provider or primary care provider.   - Advise reducing Flexeril  to 5 mg TID prn to assess sedation and tolerance. - Could consider tizanidine as an alternative muscle relaxant with scored tablets for dosing flexibility.   - Discuss opioid management with primary care provider due to contraindications with benzodiazepine use.  Right sacroiliac joint pain New right hip and buttock pain likely due to sacroiliac joint dysfunction. Prefers medication management over injection therapy. - Use heat and rest for symptom management. - Consider sacroiliac joint injection if pain persists or worsens.  The above plan and management options were discussed at length with Ms. Cowell. The side effects as well as the risks and benefits of the plan were explained. She was reminded of the importance of strict adherence to all prescription medications. The patient is also encouraged to seek their primary care providers and the dispensing pharmacists to review any education in regards to their overall medication regimen. She is in agreement with the above and verbalized understanding. There were no barriers to care and no questions prior to discharge.   At this visit the following medications were prescribed with appropriate education.  Requested Prescriptions    No prescriptions requested or ordered in this encounter    No orders of the defined types were placed in this encounter.    Return if symptoms worsen or fail to improve.  Future Appointments     Date/Time Provider Department Center Visit Type    08/30/2024 8:30 AM Callwood, Cara Endow, MD [facility] C FOLLOW UP   10/10/2024 8:30 AM Cyrus, Selinda Moose, PA [facility] MARYL BROCKS [facility] OFFICE VISIT   10/10/2024 9:15 AM Leonce Garnette Sieving, MD [facility]  C GYN RETURN   04/18/2025 9:00 AM Maree Jannett Hering, MD [facility] C RETURN VISIT       Communications: 1. The patient is advised to use mychart to contact me for non urgent clarifications regarding the above recommendations only.  2. Any new care related questions are best addressed by scheduling a follow-up visit.  3. Any scheduling related questions should be directed to our scheduling group by phone. 4. Our nursing triage is available for questions related to pre or post procedure care.  Ongoing medical care for co-morbid condition(s) and non pain relevant review of system positives are to be undertaken and addressed by their Primary Care Provider.    Attestation Statement:   I personally performed the service, non-incident to. (WP)   NICHOLE CHRISTINE GRAVES, NP   This note has been created using automated tools and reviewed for accuracy by NICHOLE CHRISTINE GRAVES.  *Some images could not be shown.

## 2024-08-28 NOTE — Progress Notes (Signed)
 Subjective:     Nicole Horton is a 54 y.o. female who presents for an established patient office visit for evaluation of influenza like symptoms. Symptoms include chills, dry cough, headache, and sore throat and have been present for 1 day. She has tried to alleviate the symptoms with cough syrup with codeine with moderate relief. High risk factors for influenza complications: co-morbid illness.  The pt has not had a flu shot. The patient hasa history of asthma.  The following portions of the patient's history were reviewed and updated as appropriate.  Review of Systems Reports associated sore throat, cough, congestion, sinus drainage, pleurisy, nausea, slight wheeze and SOB Patient is a smoker and consumes approx 0.5 ppd Denies fever, vomiting, ear pain, diarrhea, myalgia   Objective:   General appearance: alert, appears stated age, cooperative, fatigued, and mild distress Head: Normocephalic, without obvious abnormality, atraumatic Eyes: negative findings: lids and lashes normal, conjunctivae and sclerae normal, and corneas clear Ears: abnormal TM right ear - erythematous and abnormal TM left ear - erythematous and bulging Nose: copious and yellow discharge, moderate congestion, turbinates red and swollen Throat: abnormal findings: moderate oropharyngeal erythema and posterior lymphoid hyperplasia Neck: mild anterior cervical adenopathy Lungs: clear to auscultation bilaterally Heart: regular rate and rhythm, S1, S2 normal, no murmur, click, rub or gallop      Assessment:    Asthma exacerbation secondary to COVID 19    Plan:    Supportive care with appropriate antipyretics and fluids. Antivirals per orders.  Diagnoses and all orders for this visit:  COVID-19 virus detected -     nirmatrelvir-ritonavir (PAXLOVID) 300 mg (150 mg x 2)-100 mg dose pack; Take 2 (two) pink tablets (300 mg nirmatrelvir) with 1 (one) tablet (100 mg ritonavir) by mouth twice a day for 5 days. All 3  (three) tablets to be taken together every morning and evening. -     promethazine -dextromethorphan (PROMETHAZINE -DM) 6.25-15 mg/5 mL syrup; Take 5 mLs by mouth every 6 (six) hours as needed for Cough for up to 7 days  Encntr for obs for susp expsr to oth biolg agents ruled out -     Extended Respiratory Viral Panel - Mary[facility]; Future  Sore throat -     Xpert Dx Strep A, PCR - [facility]  Mild asthma without complication, unspecified whether persistent (HHS-HCC)

## 2024-08-29 ENCOUNTER — Ambulatory Visit: Payer: Self-pay | Admitting: Urology

## 2024-09-06 ENCOUNTER — Ambulatory Visit
Admission: RE | Admit: 2024-09-06 | Discharge: 2024-09-06 | Disposition: A | Discharge status: Home / Self Care | Attending: Urology | Admitting: Urology

## 2024-09-06 ENCOUNTER — Ambulatory Visit
Admission: RE | Admit: 2024-09-06 | Discharge: 2024-09-06 | Disposition: A | Discharge status: Home / Self Care | Source: Ambulatory Visit | Attending: Urology | Admitting: Urology

## 2024-09-06 ENCOUNTER — Other Ambulatory Visit: Payer: Self-pay

## 2024-09-06 DIAGNOSIS — N200 Calculus of kidney: Secondary | ICD-10-CM | POA: Diagnosis present

## 2024-09-06 NOTE — Progress Notes (Signed)
 "    09/07/2024 10:41 AM   Nicole Horton 12/26/1970 [id number]  Referring provider: Cyrus Selinda Moose, PA-C [address] [place],  [place] [address]  Urological history: 1. High risk hematuria - smoker - CTU (2017) right nephrolithiasis - cysto (2017) NED - contrast CT (08/2023) 8 mm right lower pole stone - cysto (08/2023) NED  2.  Nephrolithiasis - contrast CT (08/2023) 8 mm right lower pole stone  Chief Complaint  Patient presents with   Nephrolithiasis   HPI: Nicole Horton is a 54 y.o. woman who presents today for history of high risk hematuria and nephrolithiasis.   Patient is doing well overall. She reports a 2-year history of severe lower abdominal pain. Pain began after she underwent neck and spine surgery 11/24.  Pain occurs twice monthly. She states pain causes her to stoop over in pain and cry and pain feels similar to what she experienced with her menstrual cycles. She currently takes Midol and oxycodone  for pain management. She is being followed by OBGYN for management of symptoms and further workup.   Today she denies hematuria, dysuria, urinary frequency or urgency, fever, chills, and vomiting. She reports nausea, but takes promethazine  as needed for symptom management.   In-office UA today was unremarkable with no evidence of infection; urine microscopy with 0-5 WBCs/HPF, 0-2 RBCs/HPF, mucus threads present and moderate bacteria.   KUB reading pending.   PMH: Past Medical History:  Diagnosis Date   Abnormal uterine bleeding 05/15/2014   Adnexal pain 08/13/2015   Anxiety    a.) on BZO PRN (alprazolam)   Asthma    Cervical myelopathy with cervical radiculopathy (HCC)    Cervical spinal stenosis    Complication of anesthesia    a.) early emergence from anesthesia/sedation used for cardiac catheterization   COVID-19    Essential (primary) hypertension 07/31/2015   GERD (gastroesophageal reflux disease)    Granulomatous lung disease  (HCC)    Hematuria    History of cardiac catheterization 01/18/2014   a.) LHC 01/18/2014: normal coronaries; myocardial bridge dLAD casuing compression/spasm   History of kidney stones    Hyperlipidemia    Multiple thyroid  nodules    a.) cervical MRI 06/08/2023: multiple thyroid  nodules with largest mesauring 1.7 cm   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   a.) LHC 01/18/2014: 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   PAF (paroxysmal atrial fibrillation) (HCC)    a.) CHA2DS2-VASc = 2 (sex, HTN) as of 06/30/2023; b.) cardiac rate/rhythm maintained intrinsically without pharmacological intervention; no chronic OAC   Palpitations (PAC/PVCs)    a.) holter 10/13/2016: rare atrial and ventricular ectopy (< 1.0% study burden)   Pelvic phlebolithiasis    Pneumonia    Proteinuria    RLS (restless legs syndrome)    SVT (supraventricular tachycardia)    a.) s/p SVT ablation at [facility] on 04/21/2022    Surgical History: Past Surgical History:  Procedure Laterality Date   ABDOMINAL HYSTERECTOMY     ANTERIOR CERVICAL DECOMP/DISCECTOMY FUSION N/A 07/01/2023   Procedure: C4-7 ANTERIOR CERVICAL DISCECTOMY AND FUSION;  Surgeon: Clois Fret, MD;  Location: [facility];  Service: Neurosurgery;  Laterality: N/A;   BREAST BIOPSY Right 01/23/2021   Bx showed fibroepithelial (fibroadenoma) lesion   COLONOSCOPY     CYSTOSCOPY  08/11/2017   Procedure: CYSTOSCOPY;  Surgeon: Arloa Lamar SQUIBB, MD;  Location: [facility];  Service: Gynecology;;   ESOPHAGOGASTRODUODENOSCOPY (EGD) WITH ESOPHAGEAL DILATION     EXCISION OF BREAST LESION Right 02/13/2021   Procedure:  EXCISION OF BREAST LESION;  Surgeon: Dessa Reyes ORN, MD;  Location: [facility];  Service: General;  Laterality: Right;   LAPAROSCOPIC HYSTERECTOMY Bilateral 08/11/2017   Procedure: HYSTERECTOMY TOTAL LAPAROSCOPIC BILATERAL SALPINGECTOMY;  Surgeon: Arloa Lamar SQUIBB, MD;  Location: [facility];  Service: Gynecology;  Laterality: Bilateral;    LEFT HEART CATH AND CORONARY ANGIOGRAPHY N/A 01/18/2014   Procedure: LEFT HEART CATH AND CORONARY ANGIOGRAPHY; Location: [facility]   SVT ABLATION N/A 04/21/2022   Procedure: SVT ABLATION; Location: [facility]; Surgeon: Franky Ned, MD   TUBAL LIGATION      Home Medications:  Allergies as of 09/07/2024       Reactions   Other Nausea Only, Other (See Comments)   Pholcodine   Codeine Nausea And Vomiting   Robaxin [methocarbamol] Nausea And Vomiting        Medication List        Accurate as of September 07, 2024 10:41 AM. If you have any questions, ask your nurse or doctor.          albuterol  108 (90 Base) MCG/ACT inhaler Commonly known as: VENTOLIN  HFA Inhale 1-2 puffs into the lungs every 6 (six) hours as needed for wheezing or shortness of breath.   ALPRAZolam 0.25 MG tablet Commonly known as: XANAX Take 0.25 mg by mouth 2 (two) times daily as needed for anxiety.   chlorpheniramine-HYDROcodone 10-8 MG/5ML Commonly known as: TUSSIONEX Take 5 mLs by mouth.   cyclobenzaprine  10 MG tablet Commonly known as: FLEXERIL  Take 10 mg by mouth.   ipratropium-albuterol  0.5-2.5 (3) MG/3ML Soln Commonly known as: DUONEB Take 3 mLs by nebulization every 4 (four) hours as needed (wheezing, shortness of breath or coughing).   norethindrone 5 MG tablet Commonly known as: AYGESTIN Take 5 mg by mouth daily.   omeprazole 40 MG capsule Commonly known as: PRILOSEC Take 40 mg by mouth daily.   Paxlovid (300/100) 20 x 150 MG & 10 x 100MG  Tbpk Generic drug: nirmatrelvir/ritonavir Take by mouth.   promethazine  12.5 MG tablet Commonly known as: PHENERGAN  Take 1 tablet (12.5 mg total) by mouth every 8 (eight) hours as needed for nausea.        Allergies: Allergies[1]  Family History: Family History  Problem Relation Age of Onset   Diabetes Mother    Heart failure Mother    Breast cancer Mother 11   Brain cancer Maternal Aunt    Ovarian cancer Maternal Grandmother 46   Rectal cancer  Maternal Grandfather    Hematuria Neg Hx     Social History:  reports that she has been smoking cigarettes. She has a 15 pack-year smoking history. She has never used smokeless tobacco. She reports that she does not drink alcohol and does not use drugs.  ROS: Pertinent ROS in HPI  Physical Exam: BP 117/77   Pulse 78   Ht 5' 1 (1.549 m)   Wt 157 lb (71.2 kg)   LMP 06/10/2017   BMI 29.66 kg/m   Constitutional:  Well nourished. Alert and oriented, No acute distress. HEENT: [place] AT, moist mucus membranes.  Trachea midline, no masses. Cardiovascular: No clubbing, cyanosis, or edema. Respiratory: Normal respiratory effort, no increased work of breathing. Skin: No rashes, bruises or suspicious lesions. Neurologic: Grossly intact, no focal deficits, moving all 4 extremities. Psychiatric: Normal mood and affect.  Laboratory Data: See Epic and HPI   I have reviewed the labs.   Pertinent Imaging: KUB (09/06/24), radiologist interpretation still pending.  CT Abdomen Pelvis W Contrast (08/26/23) IMPRESSION: 1.  No acute findings within the abdomen or pelvis. 2. Right kidney lower pole 8 mm nonobstructing stone. No other urinary tract stones. No hydronephrosis. 3. Aortic atherosclerosis. I have independently reviewed the films.  See HPI.   Assessment & Plan:   Nicole Horton is a 54 y.o. woman who presents today for history of high risk hematuria and nephrolithiasis. She is doing well overall. KUB interpretation is pending, but with preliminary view it appears that the calculi may have migrated. Will order a renal ultrasound for further evaluation.  Kidney Stones - Urinalysis, Complete - Discussed with patient preliminary findings on KUB and that it appears calculi may have migrated. Will order a renal ultrasound for further evaluation and to help guide management.  - US  renal - Reviewed general stone prevention techniques including drinking plenty water  with goal of producing 2.5 L  urine daily, increased citric acid intake, avoidance of high oxalate containing foods, and decreased salt intake.    Marry MALVA Giovanni DEVONNA  [facility] [address]  [address] [place], [place] [address] [phone]     [1]  Allergies Allergen Reactions   Other Nausea Only and Other (See Comments)    Pholcodine   Codeine Nausea And Vomiting   Robaxin [Methocarbamol] Nausea And Vomiting   "

## 2024-09-07 ENCOUNTER — Ambulatory Visit (INDEPENDENT_AMBULATORY_CARE_PROVIDER_SITE_OTHER)

## 2024-09-07 ENCOUNTER — Encounter: Payer: Self-pay | Admitting: Urology

## 2024-09-07 VITALS — BP 117/77 | HR 78 | Ht 61.0 in | Wt 157.0 lb

## 2024-09-07 DIAGNOSIS — N200 Calculus of kidney: Secondary | ICD-10-CM

## 2024-09-07 LAB — URINALYSIS, COMPLETE
Bilirubin, UA: NEGATIVE
Glucose, UA: NEGATIVE
Ketones, UA: NEGATIVE
Leukocytes,UA: NEGATIVE
Nitrite, UA: NEGATIVE
Protein,UA: NEGATIVE
RBC, UA: NEGATIVE
Specific Gravity, UA: 1.03 (ref 1.005–1.030)
Urobilinogen, Ur: 0.2 mg/dL (ref 0.2–1.0)
pH, UA: 5.5 (ref 5.0–7.5)

## 2024-09-07 LAB — MICROSCOPIC EXAMINATION: Epithelial Cells (non renal): >10 /HPF — AB (ref 0–10)

## 2024-09-07 NOTE — Patient Instructions (Signed)
 Scheduling number: [phone]

## 2024-09-14 ENCOUNTER — Ambulatory Visit: Admission: RE | Admit: 2024-09-14

## 2024-09-14 DIAGNOSIS — N200 Calculus of kidney: Secondary | ICD-10-CM

## 2024-09-20 ENCOUNTER — Ambulatory Visit: Admitting: Neurosurgery

## 2024-10-12 ENCOUNTER — Encounter

## 2024-10-16 ENCOUNTER — Other Ambulatory Visit

## 2024-10-16 ENCOUNTER — Ambulatory Visit: Admitting: Neurosurgery
